# Patient Record
Sex: Female | Born: 1985 | Race: White | Hispanic: No | Marital: Married | Smoking: Never smoker
Health system: Southern US, Community
[De-identification: ages and names within clinical notes are randomized; demographics above are authoritative.]

## PROBLEM LIST (undated history)

## (undated) DIAGNOSIS — Z8759 Personal history of other complications of pregnancy, childbirth and the puerperium: Secondary | ICD-10-CM

## (undated) DIAGNOSIS — F32A Depression, unspecified: Secondary | ICD-10-CM

## (undated) DIAGNOSIS — D649 Anemia, unspecified: Secondary | ICD-10-CM

## (undated) DIAGNOSIS — O139 Gestational [pregnancy-induced] hypertension without significant proteinuria, unspecified trimester: Secondary | ICD-10-CM

## (undated) DIAGNOSIS — N393 Stress incontinence (female) (male): Secondary | ICD-10-CM

## (undated) DIAGNOSIS — E079 Disorder of thyroid, unspecified: Secondary | ICD-10-CM

## (undated) DIAGNOSIS — F329 Major depressive disorder, single episode, unspecified: Secondary | ICD-10-CM

## (undated) DIAGNOSIS — E063 Autoimmune thyroiditis: Secondary | ICD-10-CM

## (undated) DIAGNOSIS — Z862 Personal history of diseases of the blood and blood-forming organs and certain disorders involving the immune mechanism: Secondary | ICD-10-CM

## (undated) DIAGNOSIS — F419 Anxiety disorder, unspecified: Secondary | ICD-10-CM

## (undated) DIAGNOSIS — F3281 Premenstrual dysphoric disorder: Secondary | ICD-10-CM

## (undated) DIAGNOSIS — N920 Excessive and frequent menstruation with regular cycle: Secondary | ICD-10-CM

## (undated) HISTORY — DX: Disorder of thyroid, unspecified: E07.9

## (undated) HISTORY — DX: Premenstrual dysphoric disorder: F32.81

## (undated) HISTORY — DX: Gestational (pregnancy-induced) hypertension without significant proteinuria, unspecified trimester: O13.9

## (undated) HISTORY — DX: Depression, unspecified: F32.A

## (undated) HISTORY — PX: WISDOM TOOTH EXTRACTION: SHX21

---
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
## 2012-10-06 ENCOUNTER — Other Ambulatory Visit (HOSPITAL_COMMUNITY)
Admission: RE | Admit: 2012-10-06 | Discharge: 2012-10-06 | Disposition: A | Discharge status: Home / Self Care | Payer: BC Managed Care – PPO | Source: Ambulatory Visit | Attending: Family Medicine | Admitting: Family Medicine

## 2012-10-06 DIAGNOSIS — Z1151 Encounter for screening for human papillomavirus (HPV): Secondary | ICD-10-CM | POA: Insufficient documentation

## 2012-10-06 DIAGNOSIS — Z124 Encounter for screening for malignant neoplasm of cervix: Secondary | ICD-10-CM | POA: Insufficient documentation

## 2013-05-19 ENCOUNTER — Other Ambulatory Visit: Payer: Self-pay | Admitting: Family Medicine

## 2013-05-19 ENCOUNTER — Other Ambulatory Visit (HOSPITAL_COMMUNITY)
Admission: RE | Admit: 2013-05-19 | Discharge: 2013-05-19 | Disposition: A | Discharge status: Home / Self Care | Payer: BC Managed Care – PPO | Source: Ambulatory Visit | Attending: Family Medicine | Admitting: Family Medicine

## 2013-05-19 DIAGNOSIS — Z1151 Encounter for screening for human papillomavirus (HPV): Secondary | ICD-10-CM | POA: Insufficient documentation

## 2013-05-19 DIAGNOSIS — Z113 Encounter for screening for infections with a predominantly sexual mode of transmission: Secondary | ICD-10-CM | POA: Insufficient documentation

## 2013-05-19 DIAGNOSIS — Z124 Encounter for screening for malignant neoplasm of cervix: Secondary | ICD-10-CM | POA: Insufficient documentation

## 2015-05-11 DIAGNOSIS — F419 Anxiety disorder, unspecified: Secondary | ICD-10-CM | POA: Insufficient documentation

## 2015-05-11 DIAGNOSIS — Z6841 Body Mass Index (BMI) 40.0 and over, adult: Secondary | ICD-10-CM | POA: Insufficient documentation

## 2015-12-12 DIAGNOSIS — F5081 Binge eating disorder: Secondary | ICD-10-CM | POA: Insufficient documentation

## 2015-12-12 DIAGNOSIS — F50819 Binge eating disorder, unspecified: Secondary | ICD-10-CM | POA: Insufficient documentation

## 2016-01-16 LAB — OB RESULTS CONSOLE RUBELLA ANTIBODY, IGM: Rubella: IMMUNE

## 2016-01-16 LAB — OB RESULTS CONSOLE ABO/RH: RH Type: POSITIVE

## 2016-01-16 LAB — OB RESULTS CONSOLE HEPATITIS B SURFACE ANTIGEN: Hepatitis B Surface Ag: NEGATIVE

## 2016-01-16 LAB — OB RESULTS CONSOLE HIV ANTIBODY (ROUTINE TESTING): HIV: NONREACTIVE

## 2016-01-16 LAB — OB RESULTS CONSOLE RPR: RPR: NONREACTIVE

## 2016-01-16 LAB — OB RESULTS CONSOLE ANTIBODY SCREEN: Antibody Screen: NEGATIVE

## 2016-01-24 LAB — OB RESULTS CONSOLE GC/CHLAMYDIA
Chlamydia: NEGATIVE
Gonorrhea: NEGATIVE

## 2016-04-18 DIAGNOSIS — Z36 Encounter for antenatal screening of mother: Secondary | ICD-10-CM | POA: Diagnosis not present

## 2016-07-02 DIAGNOSIS — Z23 Encounter for immunization: Secondary | ICD-10-CM | POA: Diagnosis not present

## 2016-07-19 DIAGNOSIS — Z3685 Encounter for antenatal screening for Streptococcus B: Secondary | ICD-10-CM | POA: Diagnosis not present

## 2016-07-19 LAB — OB RESULTS CONSOLE GBS: GBS: NEGATIVE

## 2016-07-31 DIAGNOSIS — O3663X0 Maternal care for excessive fetal growth, third trimester, not applicable or unspecified: Secondary | ICD-10-CM | POA: Diagnosis not present

## 2016-07-31 DIAGNOSIS — Z3A37 37 weeks gestation of pregnancy: Secondary | ICD-10-CM | POA: Diagnosis not present

## 2016-08-16 ENCOUNTER — Inpatient Hospital Stay (HOSPITAL_COMMUNITY)
Admission: AD | Admit: 2016-08-16 | Discharge: 2016-08-16 | Disposition: A | Discharge status: Home / Self Care | Payer: Self-pay | Source: Ambulatory Visit | Attending: Obstetrics and Gynecology | Admitting: Obstetrics and Gynecology

## 2016-08-16 ENCOUNTER — Encounter (HOSPITAL_COMMUNITY): Payer: Self-pay | Admitting: *Deleted

## 2016-08-16 DIAGNOSIS — O471 False labor at or after 37 completed weeks of gestation: Secondary | ICD-10-CM | POA: Diagnosis not present

## 2016-08-16 DIAGNOSIS — Z3A39 39 weeks gestation of pregnancy: Secondary | ICD-10-CM | POA: Diagnosis not present

## 2016-08-16 HISTORY — DX: Anxiety disorder, unspecified: F41.9

## 2016-08-16 MED ORDER — OXYCODONE-ACETAMINOPHEN 5-325 MG PO TABS
2.0000 | ORAL_TABLET | Freq: Once | ORAL | Status: AC
  Administered 2016-08-16: 2 via ORAL
  Filled 2016-08-16: qty 2

## 2016-08-16 NOTE — Discharge Instructions (Signed)
Braxton Hicks Contractions °Contractions of the uterus can occur throughout pregnancy. Contractions are not always a sign that you are in labor.  °WHAT ARE BRAXTON HICKS CONTRACTIONS?  °Contractions that occur before labor are called Braxton Hicks contractions, or false labor. Toward the end of pregnancy (32-34 weeks), these contractions can develop more often and may become more forceful. This is not true labor because these contractions do not result in opening (dilatation) and thinning of the cervix. They are sometimes difficult to tell apart from true labor because these contractions can be forceful and people have different pain tolerances. You should not feel embarrassed if you go to the hospital with false labor. Sometimes, the only way to tell if you are in true labor is for your health care provider to look for changes in the cervix. °If there are no prenatal problems or other health problems associated with the pregnancy, it is completely safe to be sent home with false labor and await the onset of true labor. °HOW CAN YOU TELL THE DIFFERENCE BETWEEN TRUE AND FALSE LABOR? °False Labor °· The contractions of false labor are usually shorter and not as hard as those of true labor.   °· The contractions are usually irregular.   °· The contractions are often felt in the front of the lower abdomen and in the groin.   °· The contractions may go away when you walk around or change positions while lying down.   °· The contractions get weaker and are shorter lasting as time goes on.   °· The contractions do not usually become progressively stronger, regular, and closer together as with true labor.   °True Labor °· Contractions in true labor last 30-70 seconds, become very regular, usually become more intense, and increase in frequency.   °· The contractions do not go away with walking.   °· The discomfort is usually felt in the top of the uterus and spreads to the lower abdomen and low back.   °· True labor can be  determined by your health care provider with an exam. This will show that the cervix is dilating and getting thinner.   °WHAT TO REMEMBER °· Keep up with your usual exercises and follow other instructions given by your health care provider.   °· Take medicines as directed by your health care provider.   °· Keep your regular prenatal appointments.   °· Eat and drink lightly if you think you are going into labor.   °· If Braxton Hicks contractions are making you uncomfortable:   °¨ Change your position from lying down or resting to walking, or from walking to resting.   °¨ Sit and rest in a tub of warm water.   °¨ Drink 2-3 glasses of water. Dehydration may cause these contractions.   °¨ Do slow and deep breathing several times an hour.   °WHEN SHOULD I SEEK IMMEDIATE MEDICAL CARE? °Seek immediate medical care if: °· Your contractions become stronger, more regular, and closer together.   °· You have fluid leaking or gushing from your vagina.   °· You have a fever.   °· You pass blood-tinged mucus.   °· You have vaginal bleeding.   °· You have continuous abdominal pain.   °· You have low back pain that you never had before.   °· You feel your baby's head pushing down and causing pelvic pressure.   °· Your baby is not moving as much as it used to.   °  °This information is not intended to replace advice given to you by your health care provider. Make sure you discuss any questions you have with your health care   provider. °  °Document Released: 09/23/2005 Document Revised: 09/28/2013 Document Reviewed: 07/05/2013 °Elsevier Interactive Patient Education ©2016 [employer] ° °

## 2016-08-16 NOTE — MAU Provider Note (Signed)
RN called about this patient coming in for labor evaluation bu during cervical exam noted 3 minute fetal deceleration. FHT evaluated. Baseline 140s/ moderate variability/ accels noted/ one episode of decel 3 min noted with return to baseline and no loss of variability. So patient was advised to ambulate 1 hr and reassess cervix as well as FHT. I saw pt and discussed findings.  V.Bethani Brugger, MD

## 2016-08-16 NOTE — MAU Note (Signed)
PT  SAYS  SHE STARTED   HURTING  WITH  UC  SINCE 0100.   VE YESTERDAY- CLOSED.      DENIES  HSV AND MRSA.   GBS- NEG

## 2016-08-17 ENCOUNTER — Inpatient Hospital Stay (HOSPITAL_COMMUNITY): Payer: BLUE CROSS/BLUE SHIELD | Admitting: Anesthesiology

## 2016-08-17 ENCOUNTER — Encounter (HOSPITAL_COMMUNITY): Payer: Self-pay

## 2016-08-17 ENCOUNTER — Inpatient Hospital Stay (HOSPITAL_COMMUNITY)
Admission: AD | Admit: 2016-08-17 | Discharge: 2016-08-21 | DRG: 765 | Disposition: A | Discharge status: Home / Self Care | Payer: BLUE CROSS/BLUE SHIELD | Source: Ambulatory Visit | Attending: Obstetrics & Gynecology | Admitting: Obstetrics & Gynecology

## 2016-08-17 DIAGNOSIS — D62 Acute posthemorrhagic anemia: Secondary | ICD-10-CM | POA: Diagnosis not present

## 2016-08-17 DIAGNOSIS — Z3A39 39 weeks gestation of pregnancy: Secondary | ICD-10-CM | POA: Diagnosis not present

## 2016-08-17 DIAGNOSIS — O99214 Obesity complicating childbirth: Secondary | ICD-10-CM | POA: Diagnosis present

## 2016-08-17 DIAGNOSIS — Z6841 Body Mass Index (BMI) 40.0 and over, adult: Secondary | ICD-10-CM | POA: Diagnosis not present

## 2016-08-17 DIAGNOSIS — O134 Gestational [pregnancy-induced] hypertension without significant proteinuria, complicating childbirth: Secondary | ICD-10-CM | POA: Diagnosis not present

## 2016-08-17 DIAGNOSIS — Z3493 Encounter for supervision of normal pregnancy, unspecified, third trimester: Secondary | ICD-10-CM | POA: Diagnosis not present

## 2016-08-17 DIAGNOSIS — O9081 Anemia of the puerperium: Secondary | ICD-10-CM | POA: Diagnosis not present

## 2016-08-17 DIAGNOSIS — Z9889 Other specified postprocedural states: Secondary | ICD-10-CM

## 2016-08-17 LAB — COMPREHENSIVE METABOLIC PANEL
ALT: 17 U/L (ref 14–54)
AST: 21 U/L (ref 15–41)
Albumin: 3.2 g/dL — ABNORMAL LOW (ref 3.5–5.0)
Alkaline Phosphatase: 133 U/L — ABNORMAL HIGH (ref 38–126)
Anion gap: 11 (ref 5–15)
BUN: 9 mg/dL (ref 6–20)
CO2: 22 mmol/L (ref 22–32)
Calcium: 9.5 mg/dL (ref 8.9–10.3)
Chloride: 103 mmol/L (ref 101–111)
Creatinine, Ser: 0.49 mg/dL (ref 0.44–1.00)
GFR calc Af Amer: >60 mL/min (ref >60)
GFR calc non Af Amer: >60 mL/min (ref >60)
Glucose, Bld: 110 mg/dL — ABNORMAL HIGH (ref 65–99)
Potassium: 4 mmol/L (ref 3.5–5.1)
Sodium: 136 mmol/L (ref 135–145)
Total Bilirubin: 0.5 mg/dL (ref 0.3–1.2)
Total Protein: 6.7 g/dL (ref 6.5–8.1)

## 2016-08-17 LAB — CBC
HCT: 35 % — ABNORMAL LOW (ref 36.0–46.0)
Hemoglobin: 12.1 g/dL (ref 12.0–15.0)
MCH: 28.8 pg (ref 26.0–34.0)
MCHC: 34.6 g/dL (ref 30.0–36.0)
MCV: 83.3 fL (ref 78.0–100.0)
Platelets: 237 10*3/uL (ref 150–400)
RBC: 4.2 MIL/uL (ref 3.87–5.11)
RDW: 14.1 % (ref 11.5–15.5)
WBC: 18.8 10*3/uL — ABNORMAL HIGH (ref 4.0–10.5)

## 2016-08-17 LAB — TYPE AND SCREEN
ABO/RH(D): O POS
Antibody Screen: NEGATIVE

## 2016-08-17 LAB — PROTEIN / CREATININE RATIO, URINE
Creatinine, Urine: 226 mg/dL
Protein Creatinine Ratio: 0.09 mg/mg{Cre} (ref 0.00–0.15)
Total Protein, Urine: 20 mg/dL

## 2016-08-17 LAB — URIC ACID: Uric Acid, Serum: 4.7 mg/dL (ref 2.3–6.6)

## 2016-08-17 MED ORDER — OXYTOCIN 40 UNITS IN LACTATED RINGERS INFUSION - SIMPLE MED
1.0000 m[IU]/min | INTRAVENOUS | Status: DC
  Administered 2016-08-17: 2 m[IU]/min via INTRAVENOUS

## 2016-08-17 MED ORDER — OXYCODONE-ACETAMINOPHEN 5-325 MG PO TABS: 1.0000 | ORAL_TABLET | ORAL | Status: DC | PRN

## 2016-08-17 MED ORDER — FENTANYL 2.5 MCG/ML BUPIVACAINE 1/10 % EPIDURAL INFUSION (WH - ANES)
INTRAMUSCULAR | Status: AC
  Filled 2016-08-17: qty 100

## 2016-08-17 MED ORDER — OXYTOCIN BOLUS FROM INFUSION: 500.0000 mL | Freq: Once | INTRAVENOUS | Status: DC

## 2016-08-17 MED ORDER — EPHEDRINE 5 MG/ML INJ: 10.0000 mg | INTRAVENOUS | Status: DC | PRN

## 2016-08-17 MED ORDER — PHENYLEPHRINE 40 MCG/ML (10ML) SYRINGE FOR IV PUSH (FOR BLOOD PRESSURE SUPPORT): 80.0000 ug | PREFILLED_SYRINGE | INTRAVENOUS | Status: DC | PRN

## 2016-08-17 MED ORDER — FENTANYL 2.5 MCG/ML BUPIVACAINE 1/10 % EPIDURAL INFUSION (WH - ANES)
14.0000 mL/h | INTRAMUSCULAR | Status: DC | PRN
  Administered 2016-08-17 – 2016-08-18 (×2): 14 mL/h via EPIDURAL
  Filled 2016-08-17: qty 100

## 2016-08-17 MED ORDER — ONDANSETRON HCL 4 MG/2ML IJ SOLN: 4.0000 mg | Freq: Four times a day (QID) | INTRAMUSCULAR | Status: DC | PRN

## 2016-08-17 MED ORDER — FLEET ENEMA 7-19 GM/118ML RE ENEM: 1.0000 | ENEMA | RECTAL | Status: DC | PRN

## 2016-08-17 MED ORDER — OXYTOCIN 40 UNITS IN LACTATED RINGERS INFUSION - SIMPLE MED
2.5000 [IU]/h | INTRAVENOUS | Status: DC
  Filled 2016-08-17: qty 1000

## 2016-08-17 MED ORDER — LACTATED RINGERS IV SOLN
INTRAVENOUS | Status: DC
  Administered 2016-08-17: 23:00:00 via INTRAVENOUS

## 2016-08-17 MED ORDER — TERBUTALINE SULFATE 1 MG/ML IJ SOLN: 0.2500 mg | Freq: Once | INTRAMUSCULAR | Status: DC | PRN

## 2016-08-17 MED ORDER — PHENYLEPHRINE 40 MCG/ML (10ML) SYRINGE FOR IV PUSH (FOR BLOOD PRESSURE SUPPORT)
PREFILLED_SYRINGE | INTRAVENOUS | Status: AC
  Filled 2016-08-17: qty 20

## 2016-08-17 MED ORDER — LACTATED RINGERS IV SOLN: 500.0000 mL | INTRAVENOUS | Status: DC | PRN

## 2016-08-17 MED ORDER — LIDOCAINE HCL (PF) 1 % IJ SOLN: 30.0000 mL | INTRAMUSCULAR | Status: DC | PRN

## 2016-08-17 MED ORDER — OXYCODONE-ACETAMINOPHEN 5-325 MG PO TABS: 2.0000 | ORAL_TABLET | ORAL | Status: DC | PRN

## 2016-08-17 MED ORDER — DIPHENHYDRAMINE HCL 50 MG/ML IJ SOLN: 12.5000 mg | INTRAMUSCULAR | Status: DC | PRN

## 2016-08-17 MED ORDER — LIDOCAINE HCL (PF) 1 % IJ SOLN
INTRAMUSCULAR | Status: DC | PRN
  Administered 2016-08-17 (×2): 7 mL via EPIDURAL

## 2016-08-17 MED ORDER — SOD CITRATE-CITRIC ACID 500-334 MG/5ML PO SOLN
30.0000 mL | ORAL | Status: DC | PRN
  Administered 2016-08-18: 30 mL via ORAL
  Filled 2016-08-17: qty 15

## 2016-08-17 MED ORDER — LACTATED RINGERS IV SOLN
500.0000 mL | Freq: Once | INTRAVENOUS | Status: AC
  Administered 2016-08-17: 500 mL via INTRAVENOUS

## 2016-08-17 MED ORDER — ACETAMINOPHEN 325 MG PO TABS: 650.0000 mg | ORAL_TABLET | ORAL | Status: DC | PRN

## 2016-08-17 NOTE — MAU Note (Signed)
Pt presents complaining of contractions that are worse. Possibly leaking fluid. Reports good fetal movement.

## 2016-08-17 NOTE — H&P (Signed)
Ruta HindsMary A Spielberg is a 30 y.o. female G1P0 499w3d presenting for spontaneous labor.  HPI/HPP:  Normal pregnancy, presenting for reg UCs.  Possible AF leak.  No vaginal bleeding.  No PIH Sx.  OB History    Gravida Para Term Preterm AB Living   1             SAB TAB Ectopic Multiple Live Births                 Past Medical History:  Diagnosis Date  . Anxiety    History reviewed. No pertinent surgical history. Family History: family history is not on file. Social History:  reports that she has never smoked. She has never used smokeless tobacco. She reports that she does not drink alcohol or use drugs.  No Known Allergies  Dilation: 8 Effacement (%): 80 Station: 0 Exam by:: L.Mears,Rn   Blood pressure (!) 154/69, pulse 91, temperature 98.7 F (37.1 C), temperature source Oral, resp. rate (!) 22, height 5\' 5"  (1.651 m), weight 262 lb (118.8 kg), SpO2 99 %. Exam Physical Exam   FHR 150's per min with good variability, accelerations present.  No deceleration currently. UCs not monitoring well, per nursing q3-4 min.  HPP:  Patient Active Problem List   Diagnosis Date Noted  . Normal labor 08/17/2016    Prenatal labs: ABO, Rh: --/--/O POS (11/11 1945) Antibody: NEG (11/11 1945) Rubella: Immune RPR: Nonreactive (04/11 0000)  HBsAg: Negative (04/11 0000)  HIV: Non-reactive (04/11 0000)  Genetic testing: Ultrascreen neg.  AFP1 neg. US anato: wnl, resolved previa/low lying.  Normal insertion in 3rd trimester. 1 hr GTT: 135 GBS: Negative (10/13 0000)   Assessment/Plan: 39 wks Spontaneous labor progressing well.  FHR Cat 1.  Pain controled by epidural.  PIH, labs pending.  Expectant management.  Dr Billy Coastaavon primary aware of patient's progress, will take over care.  Marie-Lyne Ahmiyah Coil 08/17/2016, 9:29 PM

## 2016-08-17 NOTE — Anesthesia Preprocedure Evaluation (Addendum)
Anesthesia Evaluation  Patient identified by MRN, date of birth, ID band Patient awake    Reviewed: Allergy & Precautions, H&P , NPO status , Patient's Chart, lab work & pertinent test results  Airway Mallampati: II  TM Distance: >3 FB Neck ROM: full    Dental no notable dental hx.    Pulmonary neg pulmonary ROS,    Pulmonary exam normal        Cardiovascular negative cardio ROS Normal cardiovascular exam     Neuro/Psych negative neurological ROS     GI/Hepatic negative GI ROS, Neg liver ROS,   Endo/Other  Morbid obesity  Renal/GU negative Renal ROS     Musculoskeletal   Abdominal (+) + obese,   Peds  Hematology negative hematology ROS (+)   Anesthesia Other Findings   Reproductive/Obstetrics (+) Pregnancy                             Anesthesia Physical Anesthesia Plan  ASA: III  Anesthesia Plan: Epidural   Post-op Pain Management:    Induction:   Airway Management Planned:   Additional Equipment:   Intra-op Plan:   Post-operative Plan:   Informed Consent: I have reviewed the patients History and Physical, chart, labs and discussed the procedure including the risks, benefits and alternatives for the proposed anesthesia with the patient or authorized representative who has indicated his/her understanding and acceptance.     Plan Discussed with: CRNA and Surgeon  Anesthesia Plan Comments: (For Csection with labor epidural.)       Anesthesia Quick Evaluation

## 2016-08-17 NOTE — Anesthesia Pain Management Evaluation Note (Signed)
  CRNA Pain Management Visit Note  Patient: Ruta HindsMary A Loyal, 30 y.o., female  "Hello I am a member of the anesthesia team at [facility]. We have an anesthesia team available at all times to provide care throughout the hospital, including epidural management and anesthesia for C-section. I don't know your plan for the delivery whether it a natural birth, water birth, IV sedation, nitrous supplementation, doula or epidural, but we want to meet your pain goals."   1.Was your pain managed to your expectations on prior hospitalizations?   No prior hospitalizations  2.What is your expectation for pain management during this hospitalization?     Epidural  3.How can we help you reach that goal? Epidural in place   Record the patient's initial score and the patient's pain goal.   Pain: 2  Pain Goal: 2 [facility] wants you to be able to say your pain was always managed very well.  Freddie Nghiem 08/17/2016

## 2016-08-17 NOTE — Anesthesia Procedure Notes (Addendum)
Epidural Patient location during procedure: OB Start time: 08/17/2016 8:22 PM End time: 08/17/2016 8:25 PM  Staffing Anesthesiologist: Leilani AbleHATCHETT, Carren Blakley Performed: anesthesiologist   Preanesthetic Checklist Completed: patient identified, surgical consent, pre-op evaluation, timeout performed, IV checked, risks and benefits discussed and monitors and equipment checked  Epidural Patient position: sitting Prep: site prepped and draped and DuraPrep Patient monitoring: continuous pulse ox and blood pressure Approach: midline Location: L3-L4 Injection technique: LOR air  Needle:  Needle type: Tuohy  Needle gauge: 17 G Needle length: 9 cm and 9 Needle insertion depth: 7 cm Catheter type: closed end flexible Catheter size: 19 Gauge Catheter at skin depth: 12 cm Test dose: negative and Other  Assessment Sensory level: T9 Events: blood not aspirated, injection not painful, no injection resistance, negative IV test and no paresthesia  Additional Notes Reason for block:procedure for pain

## 2016-08-17 NOTE — Progress Notes (Addendum)
Subjective: Doing well, pain controled, UCs q4-6 min lasting 2 min  Anesthesia epidural   Objective: BP (!) 156/71   Pulse 94   Temp 98.7 F (37.1 C) (Oral)   Resp (!) 22   Ht 5\' 5"  (1.651 m)   Wt 262 lb (118.8 kg)   SpO2 99%   BMI 43.60 kg/m    FHT:  130-140's with good variability, Accelerations present.  Mild occasional early decelerations with long UCs. UC:   regular, every 4-6 minutes, lasting 2 min VE:  9 cm/100%/Vtx/0 station.  Tinted meco.         Probably Occiput Rt Ant, but molding and caput, difficult to feel fontanelles.  PIH labs wnl with AST 21, ALT 17, Plt 237, Prtn/Creat 0.09.   Assessment / Plan: Spontaneous labor, progressing normally.  PIH, no evidence of PEC.  Expectant management.  Fetal Wellbeing:  Category I Pain Control:  Epidural  Anticipated MOD:  NSVD  Hannah Acosta 08/17/2016, 10:22 PM

## 2016-08-17 NOTE — Progress Notes (Signed)
Dr Seymour Barslavoie Notified of patients chief complaint, fht, cervical exam, admit to BS. May have epidural upon request

## 2016-08-18 ENCOUNTER — Encounter (HOSPITAL_COMMUNITY)
Admission: AD | Disposition: A | Discharge status: Home / Self Care | Payer: Self-pay | Source: Ambulatory Visit | Attending: Obstetrics & Gynecology

## 2016-08-18 ENCOUNTER — Encounter (HOSPITAL_COMMUNITY): Payer: Self-pay | Admitting: *Deleted

## 2016-08-18 DIAGNOSIS — Z9889 Other specified postprocedural states: Secondary | ICD-10-CM

## 2016-08-18 LAB — ABO/RH: ABO/RH(D): O POS

## 2016-08-18 LAB — RPR: RPR Ser Ql: NONREACTIVE

## 2016-08-18 SURGERY — Surgical Case
Anesthesia: Epidural | Wound class: Clean Contaminated

## 2016-08-18 MED ORDER — ZOLPIDEM TARTRATE 5 MG PO TABS: 5.0000 mg | ORAL_TABLET | Freq: Every evening | ORAL | Status: DC | PRN

## 2016-08-18 MED ORDER — KETOROLAC TROMETHAMINE 30 MG/ML IJ SOLN: 30.0000 mg | Freq: Once | INTRAMUSCULAR | Status: DC

## 2016-08-18 MED ORDER — NALOXONE HCL 2 MG/2ML IJ SOSY
1.0000 ug/kg/h | PREFILLED_SYRINGE | INTRAMUSCULAR | Status: DC | PRN
  Filled 2016-08-18: qty 2

## 2016-08-18 MED ORDER — MEPERIDINE HCL 25 MG/ML IJ SOLN
INTRAMUSCULAR | Status: DC | PRN
  Administered 2016-08-18: 6.25 mg via INTRAVENOUS
  Administered 2016-08-18: 12.5 mg via INTRAVENOUS
  Administered 2016-08-18: 6.25 mg via INTRAVENOUS

## 2016-08-18 MED ORDER — LACTATED RINGERS IV SOLN
INTRAVENOUS | Status: DC | PRN
  Administered 2016-08-18 (×3): via INTRAVENOUS

## 2016-08-18 MED ORDER — TETANUS-DIPHTH-ACELL PERTUSSIS 5-2.5-18.5 LF-MCG/0.5 IM SUSP: 0.5000 mL | Freq: Once | INTRAMUSCULAR | Status: DC

## 2016-08-18 MED ORDER — LACTATED RINGERS IV SOLN
INTRAVENOUS | Status: DC
  Administered 2016-08-18: 15:00:00 via INTRAVENOUS

## 2016-08-18 MED ORDER — SIMETHICONE 80 MG PO CHEW
80.0000 mg | CHEWABLE_TABLET | Freq: Three times a day (TID) | ORAL | Status: DC
  Administered 2016-08-18 – 2016-08-21 (×8): 80 mg via ORAL
  Filled 2016-08-18 (×9): qty 1

## 2016-08-18 MED ORDER — NALBUPHINE HCL 10 MG/ML IJ SOLN: 5.0000 mg | Freq: Once | INTRAMUSCULAR | Status: DC | PRN

## 2016-08-18 MED ORDER — HYDROMORPHONE HCL 1 MG/ML IJ SOLN: 0.2500 mg | INTRAMUSCULAR | Status: DC | PRN

## 2016-08-18 MED ORDER — OXYTOCIN 10 UNIT/ML IJ SOLN
INTRAMUSCULAR | Status: AC
  Filled 2016-08-18: qty 4

## 2016-08-18 MED ORDER — DIBUCAINE 1 % RE OINT: 1.0000 "application " | TOPICAL_OINTMENT | RECTAL | Status: DC | PRN

## 2016-08-18 MED ORDER — BUPIVACAINE HCL (PF) 0.25 % IJ SOLN
INTRAMUSCULAR | Status: AC
  Filled 2016-08-18: qty 30

## 2016-08-18 MED ORDER — SCOPOLAMINE 1 MG/3DAYS TD PT72: 1.0000 | MEDICATED_PATCH | Freq: Once | TRANSDERMAL | Status: DC

## 2016-08-18 MED ORDER — ACETAMINOPHEN 325 MG PO TABS
650.0000 mg | ORAL_TABLET | ORAL | Status: DC | PRN
  Administered 2016-08-20 – 2016-08-21 (×6): 650 mg via ORAL
  Filled 2016-08-18 (×6): qty 2

## 2016-08-18 MED ORDER — MEPERIDINE HCL 25 MG/ML IJ SOLN: 6.2500 mg | INTRAMUSCULAR | Status: DC | PRN

## 2016-08-18 MED ORDER — SCOPOLAMINE 1 MG/3DAYS TD PT72
MEDICATED_PATCH | TRANSDERMAL | Status: DC | PRN
  Administered 2016-08-18: 1 via TRANSDERMAL

## 2016-08-18 MED ORDER — KETOROLAC TROMETHAMINE 30 MG/ML IJ SOLN
INTRAMUSCULAR | Status: AC
  Administered 2016-08-18: 30 mg via INTRAMUSCULAR
  Filled 2016-08-18: qty 1

## 2016-08-18 MED ORDER — COCONUT OIL OIL
1.0000 "application " | TOPICAL_OIL | Status: DC | PRN
  Filled 2016-08-18: qty 120

## 2016-08-18 MED ORDER — SODIUM BICARBONATE 8.4 % IV SOLN
INTRAVENOUS | Status: AC
  Filled 2016-08-18: qty 50

## 2016-08-18 MED ORDER — ONDANSETRON HCL 4 MG/2ML IJ SOLN
INTRAMUSCULAR | Status: DC | PRN
  Administered 2016-08-18: 4 mg via INTRAVENOUS

## 2016-08-18 MED ORDER — ONDANSETRON HCL 4 MG/2ML IJ SOLN: 4.0000 mg | Freq: Three times a day (TID) | INTRAMUSCULAR | Status: DC | PRN

## 2016-08-18 MED ORDER — MORPHINE SULFATE (PF) 0.5 MG/ML IJ SOLN
INTRAMUSCULAR | Status: AC
  Filled 2016-08-18: qty 10

## 2016-08-18 MED ORDER — FERROUS SULFATE 325 (65 FE) MG PO TABS
325.0000 mg | ORAL_TABLET | Freq: Two times a day (BID) | ORAL | Status: DC
  Administered 2016-08-18: 325 mg via ORAL
  Filled 2016-08-18 (×2): qty 1

## 2016-08-18 MED ORDER — ONDANSETRON HCL 4 MG/2ML IJ SOLN
INTRAMUSCULAR | Status: AC
  Filled 2016-08-18: qty 2

## 2016-08-18 MED ORDER — MEPERIDINE HCL 25 MG/ML IJ SOLN
INTRAMUSCULAR | Status: AC
  Filled 2016-08-18: qty 1

## 2016-08-18 MED ORDER — DIPHENHYDRAMINE HCL 50 MG/ML IJ SOLN: 12.5000 mg | INTRAMUSCULAR | Status: DC | PRN

## 2016-08-18 MED ORDER — CEFAZOLIN SODIUM-DEXTROSE 2-4 GM/100ML-% IV SOLN
INTRAVENOUS | Status: AC
  Filled 2016-08-18: qty 100

## 2016-08-18 MED ORDER — MORPHINE SULFATE (PF) 0.5 MG/ML IJ SOLN
INTRAMUSCULAR | Status: DC | PRN
  Administered 2016-08-18: 4 mg via EPIDURAL

## 2016-08-18 MED ORDER — SIMETHICONE 80 MG PO CHEW
80.0000 mg | CHEWABLE_TABLET | ORAL | Status: DC | PRN
  Administered 2016-08-18: 80 mg via ORAL

## 2016-08-18 MED ORDER — SODIUM BICARBONATE 8.4 % IV SOLN
INTRAVENOUS | Status: DC | PRN
  Administered 2016-08-18: 5 mL via EPIDURAL
  Administered 2016-08-18: 10 mL via EPIDURAL
  Administered 2016-08-18: 5 mL via EPIDURAL

## 2016-08-18 MED ORDER — WITCH HAZEL-GLYCERIN EX PADS: 1.0000 "application " | MEDICATED_PAD | CUTANEOUS | Status: DC | PRN

## 2016-08-18 MED ORDER — OXYTOCIN 10 UNIT/ML IJ SOLN
INTRAVENOUS | Status: DC | PRN
  Administered 2016-08-18: 40 [IU] via INTRAVENOUS

## 2016-08-18 MED ORDER — LACTATED RINGERS IV SOLN
INTRAVENOUS | Status: DC | PRN
  Administered 2016-08-18: 05:00:00 via INTRAVENOUS

## 2016-08-18 MED ORDER — SENNOSIDES-DOCUSATE SODIUM 8.6-50 MG PO TABS
2.0000 | ORAL_TABLET | ORAL | Status: DC
Start: 2016-08-19 — End: 2016-08-21
  Administered 2016-08-18 – 2016-08-20 (×3): 2 via ORAL
  Filled 2016-08-18 (×3): qty 2

## 2016-08-18 MED ORDER — IBUPROFEN 600 MG PO TABS: 600.0000 mg | ORAL_TABLET | Freq: Four times a day (QID) | ORAL | Status: DC | PRN

## 2016-08-18 MED ORDER — ACETAMINOPHEN 500 MG PO TABS
1000.0000 mg | ORAL_TABLET | Freq: Four times a day (QID) | ORAL | Status: AC
  Administered 2016-08-18 (×3): 1000 mg via ORAL
  Filled 2016-08-18 (×3): qty 2

## 2016-08-18 MED ORDER — CEFAZOLIN SODIUM-DEXTROSE 2-4 GM/100ML-% IV SOLN
2.0000 g | Freq: Once | INTRAVENOUS | Status: AC
  Administered 2016-08-18: 2 g via INTRAVENOUS

## 2016-08-18 MED ORDER — NALBUPHINE HCL 10 MG/ML IJ SOLN: 5.0000 mg | INTRAMUSCULAR | Status: DC | PRN

## 2016-08-18 MED ORDER — DIPHENHYDRAMINE HCL 25 MG PO CAPS: 25.0000 mg | ORAL_CAPSULE | Freq: Four times a day (QID) | ORAL | Status: DC | PRN

## 2016-08-18 MED ORDER — PROMETHAZINE HCL 25 MG/ML IJ SOLN: 6.2500 mg | INTRAMUSCULAR | Status: DC | PRN

## 2016-08-18 MED ORDER — KETOROLAC TROMETHAMINE 30 MG/ML IJ SOLN
30.0000 mg | Freq: Four times a day (QID) | INTRAMUSCULAR | Status: DC | PRN
  Administered 2016-08-18: 30 mg via INTRAMUSCULAR

## 2016-08-18 MED ORDER — OXYCODONE HCL 5 MG PO TABS: 10.0000 mg | ORAL_TABLET | ORAL | Status: DC | PRN

## 2016-08-18 MED ORDER — KETOROLAC TROMETHAMINE 30 MG/ML IJ SOLN: 30.0000 mg | Freq: Four times a day (QID) | INTRAMUSCULAR | Status: DC | PRN

## 2016-08-18 MED ORDER — SERTRALINE HCL 50 MG PO TABS
50.0000 mg | ORAL_TABLET | Freq: Every day | ORAL | Status: DC
  Administered 2016-08-18 – 2016-08-21 (×4): 50 mg via ORAL
  Filled 2016-08-18 (×5): qty 1

## 2016-08-18 MED ORDER — LIDOCAINE-EPINEPHRINE (PF) 2 %-1:200000 IJ SOLN
INTRAMUSCULAR | Status: AC
  Filled 2016-08-18: qty 20

## 2016-08-18 MED ORDER — DIPHENHYDRAMINE HCL 25 MG PO CAPS
25.0000 mg | ORAL_CAPSULE | ORAL | Status: DC | PRN
  Administered 2016-08-18: 25 mg via ORAL
  Filled 2016-08-18 (×2): qty 1

## 2016-08-18 MED ORDER — IBUPROFEN 600 MG PO TABS
600.0000 mg | ORAL_TABLET | Freq: Four times a day (QID) | ORAL | Status: DC
  Administered 2016-08-18 – 2016-08-21 (×13): 600 mg via ORAL
  Filled 2016-08-18 (×13): qty 1

## 2016-08-18 MED ORDER — OXYTOCIN 40 UNITS IN LACTATED RINGERS INFUSION - SIMPLE MED: 2.5000 [IU]/h | INTRAVENOUS | Status: AC

## 2016-08-18 MED ORDER — SCOPOLAMINE 1 MG/3DAYS TD PT72
MEDICATED_PATCH | TRANSDERMAL | Status: AC
  Filled 2016-08-18: qty 1

## 2016-08-18 MED ORDER — NALOXONE HCL 0.4 MG/ML IJ SOLN: 0.4000 mg | INTRAMUSCULAR | Status: DC | PRN

## 2016-08-18 MED ORDER — NALBUPHINE HCL 10 MG/ML IJ SOLN
5.0000 mg | INTRAMUSCULAR | Status: DC | PRN
  Administered 2016-08-18: 5 mg via INTRAVENOUS
  Filled 2016-08-18: qty 1

## 2016-08-18 MED ORDER — BUPIVACAINE HCL 0.25 % IJ SOLN
INTRAMUSCULAR | Status: DC | PRN
  Administered 2016-08-18: 20 mL

## 2016-08-18 MED ORDER — OXYCODONE HCL 5 MG PO TABS
5.0000 mg | ORAL_TABLET | ORAL | Status: DC | PRN
  Administered 2016-08-20 – 2016-08-21 (×7): 5 mg via ORAL
  Filled 2016-08-18 (×7): qty 1

## 2016-08-18 MED ORDER — SODIUM CHLORIDE 0.9% FLUSH: 3.0000 mL | INTRAVENOUS | Status: DC | PRN

## 2016-08-18 MED ORDER — PRENATAL MULTIVITAMIN CH
1.0000 | ORAL_TABLET | Freq: Every day | ORAL | Status: DC
  Administered 2016-08-18 – 2016-08-21 (×4): 1 via ORAL
  Filled 2016-08-18 (×4): qty 1

## 2016-08-18 MED ORDER — SIMETHICONE 80 MG PO CHEW
80.0000 mg | CHEWABLE_TABLET | ORAL | Status: DC
  Administered 2016-08-18 – 2016-08-20 (×3): 80 mg via ORAL
  Filled 2016-08-18 (×3): qty 1

## 2016-08-18 MED ORDER — MAGNESIUM HYDROXIDE 400 MG/5ML PO SUSP: 30.0000 mL | ORAL | Status: DC | PRN

## 2016-08-18 MED ORDER — MENTHOL 3 MG MT LOZG: 1.0000 | LOZENGE | OROMUCOSAL | Status: DC | PRN

## 2016-08-18 SURGICAL SUPPLY — 38 items
CHLORAPREP W/TINT 26ML (MISCELLANEOUS) ×2 IMPLANT
CLAMP CORD UMBIL (MISCELLANEOUS) IMPLANT
CLOTH BEACON ORANGE TIMEOUT ST (SAFETY) ×2 IMPLANT
CONTAINER PREFILL 10% NBF 15ML (MISCELLANEOUS) IMPLANT
DERMABOND ADHESIVE PROPEN (GAUZE/BANDAGES/DRESSINGS) ×1
DERMABOND ADVANCED .7 DNX6 (GAUZE/BANDAGES/DRESSINGS) ×1 IMPLANT
DRSG OPSITE POSTOP 4X10 (GAUZE/BANDAGES/DRESSINGS) ×2 IMPLANT
ELECT REM PT RETURN 9FT ADLT (ELECTROSURGICAL) ×2
ELECTRODE REM PT RTRN 9FT ADLT (ELECTROSURGICAL) ×1 IMPLANT
EXTRACTOR VACUUM M CUP 4 TUBE (SUCTIONS) IMPLANT
GLOVE BIO SURGEON STRL SZ 6.5 (GLOVE) ×2 IMPLANT
GLOVE BIOGEL PI IND STRL 7.0 (GLOVE) ×2 IMPLANT
GLOVE BIOGEL PI INDICATOR 7.0 (GLOVE) ×2
GOWN STRL REUS W/TWL LRG LVL3 (GOWN DISPOSABLE) ×4 IMPLANT
KIT ABG SYR 3ML LUER SLIP (SYRINGE) IMPLANT
LIQUID BAND (GAUZE/BANDAGES/DRESSINGS) IMPLANT
NEEDLE HYPO 22GX1.5 SAFETY (NEEDLE) ×2 IMPLANT
NEEDLE HYPO 25X5/8 SAFETYGLIDE (NEEDLE) IMPLANT
PACK C SECTION WH (CUSTOM PROCEDURE TRAY) ×2 IMPLANT
PAD OB MATERNITY 4.3X12.25 (PERSONAL CARE ITEMS) ×2 IMPLANT
RTRCTR C-SECT PINK 25CM LRG (MISCELLANEOUS) ×2 IMPLANT
SUT MNCRL AB 3-0 PS2 27 (SUTURE) ×2 IMPLANT
SUT MON AB 4-0 PS1 27 (SUTURE) IMPLANT
SUT PLAIN 0 NONE (SUTURE) IMPLANT
SUT PLAIN 2 0 (SUTURE) ×1
SUT PLAIN ABS 2-0 CT1 27XMFL (SUTURE) ×1 IMPLANT
SUT VIC AB 0 CT1 27 (SUTURE) ×2
SUT VIC AB 0 CT1 27XBRD ANBCTR (SUTURE) ×2 IMPLANT
SUT VIC AB 0 CTX 36 (SUTURE) ×3
SUT VIC AB 0 CTX36XBRD ANBCTRL (SUTURE) ×3 IMPLANT
SUT VIC AB 2-0 CT1 27 (SUTURE) ×1
SUT VIC AB 2-0 CT1 TAPERPNT 27 (SUTURE) ×1 IMPLANT
SUT VIC AB 3-0 SH 27 (SUTURE)
SUT VIC AB 3-0 SH 27X BRD (SUTURE) IMPLANT
SUT VIC AB 4-0 PS2 27 (SUTURE) IMPLANT
SYR CONTROL 10ML LL (SYRINGE) ×2 IMPLANT
TOWEL OR 17X24 6PK STRL BLUE (TOWEL DISPOSABLE) ×2 IMPLANT
TRAY FOLEY CATH SILVER 14FR (SET/KITS/TRAYS/PACK) IMPLANT

## 2016-08-18 NOTE — Transfer of Care (Signed)
Immediate Anesthesia Transfer of Care Note  Patient: Hannah Acosta  Procedure(s) Performed: Procedure(s): CESAREAN SECTION (N/A)  Patient Location: PACU  Anesthesia Type:Epidural  Level of Consciousness: awake  Airway & Oxygen Therapy: Patient Spontanous Breathing  Post-op Assessment: Report given to RN and Post -op Vital signs reviewed and stable  Post vital signs: stable  Last Vitals:  Vitals:   08/18/16 0301 08/18/16 0331  BP: (!) 152/71 139/78  Pulse: 94 (!) 104  Resp:    Temp:      Last Pain:  Vitals:   08/18/16 0231  TempSrc: Axillary  PainSc: 3          Complications: No apparent anesthesia complications

## 2016-08-18 NOTE — Lactation Note (Signed)
This note was copied from a baby's chart. Lactation Consultation Note  Patient Name: Hannah Addison NaegeliMary Acosta ZOXWR'UToday's Date: 08/18/2016 Reason for consult: Follow-up assessment  I received call from RN requesting that I assess latch, etc. When I first entered room, Mom stated that she was afraid that she was going to fall asleep with the infant in the bed, so she was going to get into the recliner. "[facility]" latched w/relative ease using the teacup hold, but Mom felt pinching the entire time. Swallows were clearly evident, but Mom began falling asleep while in the recliner with infant.   Mom was placed in bed in side-lying position. The change in position did not decrease nipple discomfort. [facility] would pull off, crying, despite frequent swallows noted.   Parents are exhausted (Dad did not awaken with the infant's cries or with my presence in the room). After obtaining what EBM I could from Mom, Mom gave me permission to supplement infant with formula (via cup) to see if infant could be placated enough to sleep soundly in bassinet. Infant demonstrated good tongue mobility with cup feeding, but would at times act as if she were going to spit up. Mom was falling asleep during my time in the room. I felt that infant was a fall/accidental overlay risk if Mom were to continue to attempt to breastfeed during the night. I was also concerned that parents were unable to observe [name], who would intermittently show signs of needing to spit up (but only 1 emesis noted during my consult). Infant taken to nursery with permission from Mom to be supplemented overnight.   Mom has positional stripes on both nipples.   [name], [name] [facility] 08/18/2016, 11:37 PM

## 2016-08-18 NOTE — Anesthesia Postprocedure Evaluation (Signed)
Anesthesia Post Note  Patient: Hannah Acosta  Procedure(s) Performed: Procedure(s) (LRB): CESAREAN SECTION (N/A)  Patient location during evaluation: PACU Anesthesia Type: Epidural Level of consciousness: awake Pain management: pain level controlled Vital Signs Assessment: post-procedure vital signs reviewed and stable Respiratory status: spontaneous breathing Cardiovascular status: stable Postop Assessment: no headache, no backache, epidural receding, no signs of nausea or vomiting and patient able to bend at knees Anesthetic complications: no     Last Vitals:  Vitals:   08/18/16 0640 08/18/16 0641  BP:    Pulse: (!) 107 (!) 108  Resp: 18 20  Temp:      Last Pain:  Vitals:   08/18/16 0231  TempSrc: Axillary  PainSc: 3    Pain Goal:                 Roseline Ebarb JR,JOHN Kailei Cowens

## 2016-08-18 NOTE — Progress Notes (Signed)
Subjective: Doing well, pain controled, UCs q2-4 min  Anesthesia epidural  Pitocin Augmentation  Objective: BP (!) 181/94   Pulse 97   Temp 99.3 F (37.4 C) (Axillary)   Resp 16   Ht 5\' 5"  (1.651 m)   Wt 262 lb (118.8 kg)   SpO2 99%   BMI 43.60 kg/m    FHT:  FHR: 140's bpm, variability: moderate,  accelerations:  Present,  decelerations:  Absent UC:   regular, every 2-4 minutes VE:   Dilation: Lip/rim Effacement (%): 100 Station: 0, +1 Exam by:: Lorel Lembo   Assessment / Plan: Arrest in active phase of labor.  Pitocin Augmentation.  Combination of Contractile dystocia and FPD.  No progression in more than 3 hrs.  Fetal Wellbeing:  Category I Pain Control:  Epidural  Anticipated MOD:  Decision to proceed with Urgent C/S.  Informed consent obtained.  Marie-Lyne Lydie Stammen 08/18/2016, 3:46 AM

## 2016-08-18 NOTE — Addendum Note (Signed)
Addendum  created 08/18/16 [id number] by Elgie CongoNataliya H Jakel Alphin, CRNA   Sign clinical note

## 2016-08-18 NOTE — Lactation Note (Signed)
This note was copied from a baby's chart. Lactation Consultation Note  Patient Name: Girl Addison NaegeliMary Romanoff LKGMW'NToday's Date: 08/18/2016 baby is 11 hours and has been to the breast.  Reason for consult: Initial assessment;Other (Comment) (per parents baby recently fed at 230 pm for 20 mins . mom and dad aware to call with feeding cues for Latch socre and feeding assess with feeding cues). Per mom [facility]  Presently great grandmother holding baby. LC reviewed few breast feeding basics - importance of feeding assessment by RN or LC. Mother informed of post-discharge support and given phone number to the lactation department, including services for phone call assistance; out-patient appointments; and breastfeeding support group. List of other breastfeeding resources in the community given in the handout. Encouraged mother to call for problems or concerns related to breastfeeding.   Maternal Data Does the patient have breastfeeding experience prior to this delivery?: No  Feeding Feeding Type: Breast Fed Length of feed: 20 min (per mom and think she heard swallows )  LATCH Score/Interventions                      Lactation Tools Discussed/Used     Consult Status Consult Status: Follow-up Date: 08/18/16 Follow-up type: In-patient    Matilde SprangMargaret Ann Lavarius Doughten 08/18/2016, 3:48 PM

## 2016-08-18 NOTE — Op Note (Signed)
Preoperative diagnosis: Intrauterine pregnancy at 39 weeks and 4 days                                            Arrest of Progression in active phase   Post operative diagnosis: Same  Anesthesia: Epidural  Anesthesiologist: Dr. Arby BarretteHatchett  Procedure: Primary urgent low transverse cesarean section  Surgeon: Dr. Genia DelMarie-Lyne Nathalia Wismer  Assistant: Marlinda Mikeanya Bailey  Estimated blood loss: 800 cc  Procedure:  After being informed of the planned procedure and possible complications including bleeding, infection, injury to other organs, informed consent is obtained. The patient is taken to OR #9 and epidural anesthesia level raised without complication. She is placed in the dorsal decubitus position with the pelvis tilted to the left. She is then prepped and draped in a sterile fashion. A Foley catheter is inserted in her bladder.  After assessing adequate level of anesthesia, we infiltrate the suprapubic area with 20 cc of Marcaine 0.25 and perform a Pfannenstiel incision which is brought down sharply to the fascia. The fascia is entered in a low transverse fashion. Linea alba is dissected. Peritoneum is entered in a midline fashion. An Alexis retractor is easily positioned. Visceral peritoneum is entered in a low transverse fashion allowing us to safely retract bladder by developing a bladder flap.  The myometrium is then entered in a low transverse fashion; first with knife and then extended bluntly. Amniotic fluid is tinted meconium. We assist the birth of a Female  infant in cephalic presentation. Mouth and nose are suctioned. The baby is delivered. The cord is clamped and sectioned. The baby is given to the neonatologist present in the room.  10 cc of blood is drawn from the umbilical vein.The placenta is allowed to deliver spontaneously. It is complete and the cord has 3 vessels. Uterine revision is negative.  We proceed with closure of the myometrium in 2 layers: First with a running locked suture  of 0 Vicryl, then with a Lembert suture of 0 Vicryl imbricating the first one. Hemostasis is completed with cauterization on peritoneal edges and a figure of eight with Vicryl 0 at the right angle.  Both paracolic gutters are cleaned. Both tubes and ovaries are assessed and normal.  We confirm a satisfactory hemostasis.  Retractors and sponges are removed. Under fascia hemostasis is completed with cauterization.  The parietal peritoneum is closed with a running suture of Vicryl 2-0.  The fascia is then closed with 2 running sutures of 0 Vicryl meeting midline. The wound is irrigated with warm saline and hemostasis is completed with cauterization. The adipose tissue is approximated with a running Plain 2-0.  The skin is closed with a subcuticular suture of 3-0 Monocryl and Dermabond.  A Honeycomb dressing is added.  Instrument and sponge count is complete x2. Estimated blood loss is 800 cc.  The procedure is well tolerated by the patient who is taken to recovery room in a well and stable condition.  female baby named [facility] was born at 4:38 am and received an Apgar of 9  at 1 minute and 9 at 5 minutes. Weight was pending.    Specimen: Placenta sent to L & D   Genia DelMarie-Lyne Augusto Deckman MD 11/12/20173:53 AM

## 2016-08-18 NOTE — Anesthesia Postprocedure Evaluation (Signed)
Anesthesia Post Note  Patient: Hannah Acosta  Procedure(s) Performed: Procedure(s) (LRB): CESAREAN SECTION (N/A)  Patient location during evaluation: Mother Baby Anesthesia Type: Epidural Level of consciousness: awake and alert Pain management: pain level controlled Vital Signs Assessment: post-procedure vital signs reviewed and stable Respiratory status: spontaneous breathing and nonlabored ventilation Cardiovascular status: stable Postop Assessment: no headache, patient able to bend at knees, no backache, no signs of nausea or vomiting, epidural receding and adequate PO intake Anesthetic complications: no     Last Vitals:  Vitals:   08/18/16 0753 08/18/16 0850  BP: (!) 145/68 131/62  Pulse: (!) 110 (!) 101  Resp: 18 18  Temp: 36.3 C 36.6 C    Last Pain:  Vitals:   08/18/16 0850  TempSrc: Oral  PainSc: 0-No pain   Pain Goal:                 Land O'LakesMalinova,Nissa Stannard Hristova

## 2016-08-19 DIAGNOSIS — D62 Acute posthemorrhagic anemia: Secondary | ICD-10-CM | POA: Diagnosis not present

## 2016-08-19 LAB — CBC
HCT: 28.4 % — ABNORMAL LOW (ref 36.0–46.0)
Hemoglobin: 9.5 g/dL — ABNORMAL LOW (ref 12.0–15.0)
MCH: 28.9 pg (ref 26.0–34.0)
MCHC: 33.5 g/dL (ref 30.0–36.0)
MCV: 86.3 fL (ref 78.0–100.0)
Platelets: 158 10*3/uL (ref 150–400)
RBC: 3.29 MIL/uL — ABNORMAL LOW (ref 3.87–5.11)
RDW: 14.6 % (ref 11.5–15.5)
WBC: 13.3 10*3/uL — ABNORMAL HIGH (ref 4.0–10.5)

## 2016-08-19 MED ORDER — MAGNESIUM OXIDE 400 (241.3 MG) MG PO TABS
400.0000 mg | ORAL_TABLET | Freq: Every day | ORAL | Status: DC
  Administered 2016-08-19 – 2016-08-21 (×3): 400 mg via ORAL
  Filled 2016-08-19 (×4): qty 1

## 2016-08-19 MED ORDER — POLYSACCHARIDE IRON COMPLEX 150 MG PO CAPS
150.0000 mg | ORAL_CAPSULE | Freq: Every day | ORAL | Status: DC
  Administered 2016-08-19 – 2016-08-21 (×3): 150 mg via ORAL
  Filled 2016-08-19 (×3): qty 1

## 2016-08-19 NOTE — Progress Notes (Signed)
MOB was referred for history of depression/anxiety. * Referral screened out by Clinical Social Worker because none of the following criteria appear to apply: ~ History of anxiety/depression during this pregnancy, or of post-partum depression. ~ Diagnosis of anxiety and/or depression within last 3 years OR * MOB's symptoms currently being treated with medication and/or therapy. Please contact the Clinical Social Worker if needs arise, or if MOB requests.  Waleska Buttery Boyd-Gilyard, MSW, LCSW Clinical Social Work [phone] 

## 2016-08-19 NOTE — Lactation Note (Signed)
This note was copied from a baby's chart. Lactation Consultation Note  Patient Name: Hannah Addison NaegeliMary Acosta ZOXWR'UToday's Date: 08/19/2016 Reason for consult: Follow-up assessment;Breast/nipple pain  Visited with this first time Mom, baby 30 hrs old delivered by C/S. Mom was exhausted last night, so baby went to CN and was fed formula by cup.  Mom states she slept well.  Baby has latched on twice this morning since.  Assisted with positioning and latching baby in football hold on right side.  Mom needing some guidance with supporting and sandwiching breast to facilitate a deep areolar latch, swallows identified.  Hand expression for colostrum done to entice baby.  Nipples a little pink, but no skin breakdown noted.  Lots of basic teaching done.  Encouraged Mom to continue STS and cue based feedings. To ask for assistance as needed.  Lactation to follow up tomorrow.    Consult Status Consult Status: Follow-up Date: 08/20/16 Follow-up type: In-patient    Judee ClaraSmith, Liani Caris E 08/19/2016, 11:22 AM

## 2016-08-19 NOTE — Progress Notes (Signed)
Subjective: POD# 1 Information for the patient's newborn:  Hannah Acosta, Girl Hannah Acosta [[id number]][[id number]]  female  Baby name: Hannah Acosta  Reports feeling well, rested last night / baby went to nursery. Feeding: breast Patient reports tolerating PO.  Breast symptoms: no pain, baby sleepy at breast. Pain controlled withPO meds Denies HA/SOB/C/P/N/V/dizziness. Flatus present. She reports vaginal bleeding as normal, without clots.  She is ambulating, urinating without difficulty.     Objective:   VS:    Vitals:   08/18/16 1000 08/18/16 1820 08/19/16 0150 08/19/16 0641  BP: 136/72 123/73 126/67 (!) 145/80  Pulse: 94 81 84 (!) 101  Resp: 18 18 18 16   Temp: 98.1 F (36.7 C) 98.3 F (36.8 C) 98.2 F (36.8 C) 98.5 F (36.9 C)  TempSrc: Oral Oral Oral Oral  SpO2: 100% 100% 99%   Weight:      Height:         Intake/Output Summary (Last 24 hours) at 08/19/16 0915 Last data filed at 08/18/16 2322  Gross per 24 hour  Intake             1325 ml  Output             1525 ml  Net             -200 ml        Recent Labs  08/17/16 1945 08/19/16 0511  WBC 18.8* 13.3*  HGB 12.1 9.5*  HCT 35.0* 28.4*  PLT 237 158     Blood type: --/--/O POS, O POS (11/11 1945)  Rubella: Immune (04/11 0000)     Physical Exam:  General: alert, cooperative and no distress CV: Regular rate and rhythm Resp: clear Abdomen: soft, nontender, normal bowel sounds Incision: clean, dry and intact Uterine Fundus: firm, below umbilicus, nontender Lochia: minimal Ext: edema +, no tenderness or calf pain      Assessment/Plan: 30 y.o.   POD# 1. G1P1001                  Principal Problem:   Postpartum care following cesarean delivery (11/12) Active Problems:   Cesarean delivery delivered   Acute blood loss anemia  - asymptomatic  - started oral Fe and mag-ox  Doing well, stable.               Advance diet as tolerated Encourage rest when baby rests Breastfeeding support Encourage to ambulate Routine post-op  care  Neta Mendsaniela C Teshara Moree, CNM, MSN 08/19/2016, 9:15 AM

## 2016-08-20 MED ORDER — BISACODYL 10 MG RE SUPP
10.0000 mg | Freq: Every day | RECTAL | Status: DC | PRN
  Administered 2016-08-20: 10 mg via RECTAL
  Filled 2016-08-20: qty 1

## 2016-08-20 NOTE — Progress Notes (Addendum)
S/P Primary Cesarean Delivery for Arrest of Dilatation in Active Phase Subjective: POD# 2 Information for the patient's newborn:  Hannah Acosta, Hannah Hannah [[id number]][[id number]]  female  Baby name: Hannah Acosta  Reports feeling well. Feeding: breast Patient reports tolerating PO.  Breast symptoms: (+) colostrum. Pain controlled with ibuprofen (OTC) and narcotic analgesics including Percocet Denies HA/SOB/C/P/N/V/dizziness. Flatus present. No BM - since Thursday 11/9. She reports vaginal bleeding as normal, without clots.  She is ambulating, urinating without difficulty.     Objective:   VS:    Vitals:   08/19/16 0150 08/19/16 0641 08/19/16 1853 08/20/16 0640  BP: 126/67 (!) 145/80 129/66 123/72  Pulse: 84 (!) 101 97 84  Resp: 18 16 17 18   Temp: 98.2 F (36.8 C) 98.5 F (36.9 C) 98 F (36.7 C) 98.1 F (36.7 C)  TempSrc: Oral Oral Oral Oral  SpO2: 99%     Weight:      Height:          Recent Labs  08/17/16 1945 08/19/16 0511  WBC 18.8* 13.3*  HGB 12.1 9.5*  HCT 35.0* 28.4*  PLT 237 158     Blood type: O POS (11/11 1945)  Rubella: Immune (04/11 0000)     Physical Exam:  General: alert, cooperative and no distress CV: Regular rate and rhythm Resp: clear Abdomen: soft, nontender, hypoactive bowel sounds, (+) distension Incision: clean, dry and intact Uterine Fundus: firm, 1 FB below umbilicus, nontender Lochia: minimal Ext: edema +, no tenderness or calf pain   Assessment/Plan: 30 y.o.   POD# 2. G1P1001                  Principal Problem:   Postpartum care following cesarean delivery (11/12) Indication: arrest of dilation Active Problems:   Cesarean delivery delivered   Acute blood loss anemia  - asymptomatic  - continue Niferex 150 mg po daily and Magnesium Oxide 400 mg po  Doing well, stable.               Regular diet as tolerated Encourage rest when baby rests Breastfeeding support Encourage to ambulate 2-3 times in hallway Routine post-op care Ducolax  suppository prn constipation Anticipate D/C home tomorrow  Hannah Acosta, Hannah Acosta, M, CNM, MSN 08/20/2016, 8:29 AM

## 2016-08-20 NOTE — Lactation Note (Signed)
This note was copied from a baby's chart. Lactation Consultation Note LC [facility], Danielle to encouraged mom to increase supplementation as tolerated to baby now 65 hours of age.    Patient Name: Hannah Addison NaegeliMary Lung WUJWJ'XToday's Date: 08/20/2016     Maternal Data    Feeding Feeding Type: Formula Length of feed: 15 min  LATCH Score/Interventions                      Lactation Tools Discussed/Used Tools: Nipple Shields Nipple shield size: 20   Consult Status      Hannah Acosta, Hannah Acosta 08/20/2016, 10:10 PM

## 2016-08-20 NOTE — Lactation Note (Signed)
This note was copied from a baby's chart. Lactation Consultation Note Mom's nipples are cracked, positional stripes, and painful. Comfort gels given. Has short shaft, but soft breast tissue and compressible.  Noted baby has upper labial frenulum, lower frenulum and mid anterior frenulum visible.  Baby acts as if starving. Parents has been supplementing formula w/foley cup.  Baby very jaundice. Lab into drew serum. In football hold, latched baby w/#20 NS. BF well. No colostrum noted in NS, although heard swallow.  SNS initiated w/Alimentum, instructions, demonstration, then mom applying her self and latching baby.  Gave shells to wear in bra to assist in everting nipple.  Spent a lot of time teaching and latching baby. Baby would resist at times d/t frantic wanting to suck.  Parents states they feel comfortable w/set up of SNS and plan for feeding.  Will ask on coming RN to set up DEBP that LC brought into room.  Patient Name: Hannah Addison NaegeliMary Acosta ZOXWR'UToday's Date: 08/20/2016 Reason for consult: Follow-up assessment;Difficult latch;Breast/nipple pain;Hyperbilirubinemia   Maternal Data    Feeding Feeding Type: Formula Length of feed: 30 min  LATCH Score/Interventions Latch: Repeated attempts needed to sustain latch, nipple held in mouth throughout feeding, stimulation needed to elicit sucking reflex. Intervention(s): Adjust position;Assist with latch;Breast massage;Breast compression  Audible Swallowing: A few with stimulation Intervention(s): Skin to skin;Hand expression;Alternate breast massage  Type of Nipple: Everted at rest and after stimulation  Comfort (Breast/Nipple): Engorged, cracked, bleeding, large blisters, severe discomfort Problem noted: Cracked, bleeding, blisters, bruises Intervention(s): Hand pump;Expressed breast milk to nipple  Problem noted: Mild/Moderate discomfort Interventions (Mild/moderate discomfort): Hand massage;Hand expression;Post-pump;Comfort gels;Breast  shields  Hold (Positioning): Full assist, staff holds infant at breast Intervention(s): Breastfeeding basics reviewed;Support Pillows;Position options;Skin to skin  LATCH Score: 4  Lactation Tools Discussed/Used Tools: Shells;Nipple Shields;Pump;Supplemental Nutrition System;Comfort gels Nipple shield size: 20 Shell Type: Inverted Breast pump type: Manual Initiated by::  (RN asked to set up)   Consult Status Consult Status: Follow-up Date: 08/20/16 Follow-up type: In-patient    Hannah Acosta, Diamond NickelLAURA G 08/20/2016, 7:08 AM

## 2016-08-20 NOTE — Plan of Care (Signed)
Problem: Bowel/Gastric: Goal: Gastrointestinal status will improve Outcome: Progressing Patient had a large bowel movement after a bisacodyl suppository was given. She reports feeling much better.

## 2016-08-21 MED ORDER — OXYCODONE HCL 5 MG PO TABS: 5.0000 mg | ORAL_TABLET | ORAL | 0 refills | Status: DC | PRN

## 2016-08-21 MED ORDER — MAGNESIUM OXIDE 400 (241.3 MG) MG PO TABS: 400.0000 mg | ORAL_TABLET | Freq: Every day | ORAL | 0 refills | Status: DC

## 2016-08-21 MED ORDER — POLYSACCHARIDE IRON COMPLEX 150 MG PO CAPS: 150.0000 mg | ORAL_CAPSULE | Freq: Every day | ORAL | 0 refills | Status: DC

## 2016-08-21 MED ORDER — IBUPROFEN 600 MG PO TABS: 600.0000 mg | ORAL_TABLET | Freq: Four times a day (QID) | ORAL | 0 refills | Status: DC

## 2016-08-21 NOTE — Discharge Summary (Signed)
OB Discharge Summary  Patient Name: Hannah HindsMary A Tineo DOB: [birth year] MRN: [id number]  Date of admission: 08/17/2016  Admitting diagnosis: 39.3 WKS, CTXS, WATER BROKE Intrauterine pregnancy: 8288w4d        Date of discharge: 08/21/2016    Discharge diagnosis: POD 3 s/p CS arrest of active labor / term delivery   Prenatal history: G1P1001   EDC : 08/21/2016, by Other Basis  [facility]  Primary provider : Taavon Prenatal course uncomplicated   Prenatal Labs: ABO, Rh: --/--/O POS, O POS (11/11 1945)  Antibody: NEG (11/11 1945) Rubella: Immune (04/11 0000)  RPR: Non Reactive (11/11 1945)  HBsAg: Negative (04/11 0000)  HIV: Non-reactive (04/11 0000)  GBS: Negative (10/13 0000)                                    Hospital course:  Onset of Labor With Unplanned C/S  30 y.o. yo G1P1001 at 8788w4d was admitted in Active Labor on 08/17/2016. Patient had a labor course significant for arrest of active labor 5cm. Membrane Rupture Time/Date: 10:00 AM ,08/17/2016   The patient went for cesarean section due to Arrest of Dilation, and delivered a Viable infant,08/18/2016  Details of operation can be found in separate operative note. Patient had an uncomplicated postpartum course.  She is ambulating,tolerating a regular diet, passing flatus, and urinating well.  Patient is discharged home in stable condition 09/12/16.  Augmentation: Pitocin Delivering PROVIDER: LAVOIE, MARIE-LYNE                                                            Complications: None  Newborn Data: Live born female  Birth Weight: 8 lb 4.8 oz (3765 g) APGAR: 9, 9  Baby Feeding: Breast Disposition:home with mother  Post partum procedures:none  Postpartum contraception: Not Discussed    Labs: Lab Results  Component Value Date   WBC 13.3 (H) 08/19/2016   HGB 9.5 (L) 08/19/2016   HCT 28.4 (L) 08/19/2016   MCV 86.3 08/19/2016   PLT 158 08/19/2016   CMP Latest Ref Rng &  Units 08/17/2016  Glucose 65 - 99 mg/dL 409(W110(H)  BUN 6 - 20 mg/dL 9  Creatinine 1.190.44 - 1.471.00 mg/dL 8.290.49  Sodium 562135 - 130145 mmol/L 136  Potassium 3.5 - 5.1 mmol/L 4.0  Chloride 101 - 111 mmol/L 103  CO2 22 - 32 mmol/L 22  Calcium 8.9 - 10.3 mg/dL 9.5  Total Protein 6.5 - 8.1 g/dL 6.7  Total Bilirubin 0.3 - 1.2 mg/dL 0.5  Alkaline Phos 38 - 126 U/L 133(H)  AST 15 - 41 U/L 21  ALT 14 - 54 U/L 17    Physical Exam @ time of discharge:  Vitals:   08/19/16 1853 08/20/16 0640 08/20/16 1754 08/21/16 0543  BP: 129/66 123/72 132/72 137/88  Pulse: 97 84 92 85  Resp: 17 18 18 20   Temp: 98 F (36.7 C) 98.1 F (36.7 C) 98.4 F (36.9 C) 97.4 F (36.3 C)  TempSrc: Oral Oral Oral Oral  SpO2:      Weight:      Height:        General: alert, cooperative and no distress Lochia:  appropriate Uterine Fundus: firm Perineum: intact Incision: Healing well with no significant drainage Extremities: DVT Evaluation: No evidence of DVT seen on physical exam.   Discharge instructions:  "Baby and Me Booklet" and [facility] Booklet  Discharge Medications:    Medication List    TAKE these medications   ibuprofen 600 MG tablet Commonly known as:  ADVIL,MOTRIN Take 1 tablet (600 mg total) by mouth every 6 (six) hours.   iron polysaccharides 150 MG capsule Commonly known as:  NIFEREX Take 1 capsule (150 mg total) by mouth daily. Start taking on:  08/22/2016   magnesium oxide 400 (241.3 Mg) MG tablet Commonly known as:  MAG-OX Take 1 tablet (400 mg total) by mouth daily. Start taking on:  08/22/2016   oxyCODONE 5 MG immediate release tablet Commonly known as:  Oxy IR/ROXICODONE Take 1 tablet (5 mg total) by mouth every 4 (four) hours as needed (pain scale 4-7).   prenatal multivitamin Tabs tablet Take 1 tablet by mouth daily at 12 noon.   sertraline 50 MG tablet Commonly known as:  ZOLOFT Take 50 mg by mouth daily.       Diet: routine diet  Activity: Advance as tolerated. Pelvic  rest x 6 weeks.   Follow up:6 weeks    Signed: Marlinda MikeBAILEY, Dung Prien CNM, MSN, [facility] 08/21/2016, 1:57 PM

## 2016-08-21 NOTE — Progress Notes (Signed)
POSTOPERATIVE DAY # 3 S/P CS   S:         Reports feeling better             Tolerating po intake / no nausea / no vomiting / + flatus / no BM             Bleeding is light             Pain controlled with motrin and oxycodone             Up ad lib / ambulatory/ voiding QS    O:  VS: BP 137/88 (BP Location: Right Arm)   Pulse 85   Temp 97.4 F (36.3 C) (Oral)   Resp 20   Ht 5\' 5"  (1.651 m)   Wt 118.8 kg (262 lb)   SpO2 99%   Breastfeeding? Unknown   BMI 43.60 kg/m    LABS:               Recent Labs  08/19/16 0511  WBC 13.3*  HGB 9.5*  PLT 158               Bloodtype: --/--/O POS, O POS (11/11 1945)  Rubella: Immune (04/11 0000)                                    Physical Exam:             Alert and Oriented X3  Lungs: Clear and unlabored  Heart: regular rate and rhythm / no mumurs  Abdomen: soft, non-tender, non-distended             Fundus: firm, non-tender, Ueven             Dressing intact              Incision:  approximated with suture / no erythema / no ecchymosis / no drainage  Perineum: intact  Lochia: light   A:        POD # 3 S/P CS             P:        Routine postoperative care              Plan for DC to home today - [facility] booklet instructions reviewed     Marlinda MikeBAILEY, [place] Mcphee CNM, MSN, [facility] 08/21/2016, 1:54 PM

## 2016-08-21 NOTE — Lactation Note (Addendum)
This note was copied from a baby's chart. Lactation Consultation Note  Patient Name: Hannah Addison NaegeliMary Acosta WUJWJ'XToday's Date: 08/21/2016 Reason for consult: Follow-up assessment  Infant w/loud, frequent swallows at the breast, but "[facility]" still acts if she's not getting enough. Morning weight does not seem likely (a weight gain of 11+oz over 24 hours), so parents agreeable to a reweigh & we'll do pre- & post-weights to assess milk transfer. Pre-weight shows that infant is at 7% below BW. Post-weight (after a 10-min feeding w/frequent swallows) showed no transfer.   [facility] was supplemented at breast w/a nipple shield (size 20) & 5Fr + syringe (parents did not like the starter SNS--infant took too long to feed, 1.5 hours). Infant would drink the supplement, but would still act hungry. Hannah Acosta's swallows were quite loud when being supplemented. A bottle was attempted, but [facility] did not take the bottle well, even with chin support (after 10 minutes, she had only drunk 1mL).   Mom did pump during consult & she obtained 18mL from the L breast & 2mL from the R breast. Mom's breasts feel soft. Infant is now 880hrs old. There may a delay in Lactogenesis II b/c of obesity.   Mom's nipples w/compression stripes bilaterally. Mom does have a DEBP at home.   [facility] was called & Dr. Vaughan BastaSummer was given an update at [id number]. In light of Corrie DandyMary Acosta's feeding difficulties, etc. I did suggest that she be evaluated for a posterior tongue-tie or evaluated by a speech/occupational therapist to determine cause of loud swallowing.   Plan: 1. Mom to pump around the clock. 2. Parents to supplement [facility] with the beginning of every feeding & until she is satiated.  3. Parents for bili check tomorrow.   Hannah Acosta, Hannah Acosta 08/21/2016, 1:41 PM

## 2016-08-22 NOTE — Progress Notes (Signed)
Post discharge chart review completed.  

## 2016-10-12 ENCOUNTER — Telehealth (HOSPITAL_COMMUNITY): Payer: Self-pay | Admitting: Lactation Services

## 2016-10-12 NOTE — Telephone Encounter (Signed)
Mom called wanting information about how to increase her milk supply.   Infant is 718 weeks old and is feeding every 2-3 hrs about 9-10 times per day. Mom reports she has been exclusively pumping for several weeks and is using hands-on pumping method. Mom stated she was pumping every 2 hrs but is not pumping that much only about 5-6 times total per day.   LC told mom she needed to increase the number of times she pumps per day to equal the number of times the infant is feeding (at least 8 times per day).  Explain supply and demand.   Discussed galactagogues (Fenugreek, Mother's Milk, Moringa, and Lactation Support herbal supplement) but explained increasing the number of pumpings were very important. Also suggested power pumping for 1 hr in the evenings for a few evenings to increase demand. Mom appreciative of suggestions given and plans to increase her number of pumpings per day.

## 2016-11-14 DIAGNOSIS — J988 Other specified respiratory disorders: Secondary | ICD-10-CM | POA: Diagnosis not present

## 2017-05-15 DIAGNOSIS — N3941 Urge incontinence: Secondary | ICD-10-CM | POA: Diagnosis not present

## 2017-12-31 DIAGNOSIS — Z01419 Encounter for gynecological examination (general) (routine) without abnormal findings: Secondary | ICD-10-CM | POA: Diagnosis not present

## 2017-12-31 DIAGNOSIS — Z6841 Body Mass Index (BMI) 40.0 and over, adult: Secondary | ICD-10-CM | POA: Diagnosis not present

## 2018-01-08 DIAGNOSIS — R109 Unspecified abdominal pain: Secondary | ICD-10-CM | POA: Diagnosis not present

## 2018-01-20 DIAGNOSIS — R05 Cough: Secondary | ICD-10-CM | POA: Diagnosis not present

## 2018-01-20 DIAGNOSIS — J029 Acute pharyngitis, unspecified: Secondary | ICD-10-CM | POA: Diagnosis not present

## 2018-01-20 DIAGNOSIS — B349 Viral infection, unspecified: Secondary | ICD-10-CM | POA: Diagnosis not present

## 2018-04-22 DIAGNOSIS — H66001 Acute suppurative otitis media without spontaneous rupture of ear drum, right ear: Secondary | ICD-10-CM | POA: Diagnosis not present

## 2018-04-22 DIAGNOSIS — R05 Cough: Secondary | ICD-10-CM | POA: Diagnosis not present

## 2018-04-22 DIAGNOSIS — J0100 Acute maxillary sinusitis, unspecified: Secondary | ICD-10-CM | POA: Diagnosis not present

## 2018-04-22 DIAGNOSIS — R062 Wheezing: Secondary | ICD-10-CM | POA: Diagnosis not present

## 2018-05-26 DIAGNOSIS — S80861A Insect bite (nonvenomous), right lower leg, initial encounter: Secondary | ICD-10-CM | POA: Diagnosis not present

## 2018-05-26 DIAGNOSIS — W57XXXA Bitten or stung by nonvenomous insect and other nonvenomous arthropods, initial encounter: Secondary | ICD-10-CM | POA: Diagnosis not present

## 2018-05-26 DIAGNOSIS — L739 Follicular disorder, unspecified: Secondary | ICD-10-CM | POA: Diagnosis not present

## 2018-06-04 DIAGNOSIS — W57XXXA Bitten or stung by nonvenomous insect and other nonvenomous arthropods, initial encounter: Secondary | ICD-10-CM | POA: Diagnosis not present

## 2018-06-04 DIAGNOSIS — L739 Follicular disorder, unspecified: Secondary | ICD-10-CM | POA: Diagnosis not present

## 2018-06-04 DIAGNOSIS — S80861A Insect bite (nonvenomous), right lower leg, initial encounter: Secondary | ICD-10-CM | POA: Diagnosis not present

## 2018-08-14 DIAGNOSIS — Z3687 Encounter for antenatal screening for uncertain dates: Secondary | ICD-10-CM | POA: Diagnosis not present

## 2018-08-14 DIAGNOSIS — Z3201 Encounter for pregnancy test, result positive: Secondary | ICD-10-CM | POA: Diagnosis not present

## 2018-08-19 DIAGNOSIS — F411 Generalized anxiety disorder: Secondary | ICD-10-CM | POA: Diagnosis not present

## 2018-08-19 DIAGNOSIS — F438 Other reactions to severe stress: Secondary | ICD-10-CM | POA: Diagnosis not present

## 2018-08-24 DIAGNOSIS — Z3201 Encounter for pregnancy test, result positive: Secondary | ICD-10-CM | POA: Diagnosis not present

## 2018-08-26 DIAGNOSIS — F438 Other reactions to severe stress: Secondary | ICD-10-CM | POA: Diagnosis not present

## 2018-08-26 DIAGNOSIS — F411 Generalized anxiety disorder: Secondary | ICD-10-CM | POA: Diagnosis not present

## 2018-08-31 DIAGNOSIS — Z3689 Encounter for other specified antenatal screening: Secondary | ICD-10-CM | POA: Diagnosis not present

## 2018-08-31 DIAGNOSIS — Z3481 Encounter for supervision of other normal pregnancy, first trimester: Secondary | ICD-10-CM | POA: Diagnosis not present

## 2018-09-24 DIAGNOSIS — F438 Other reactions to severe stress: Secondary | ICD-10-CM | POA: Diagnosis not present

## 2018-09-24 DIAGNOSIS — F411 Generalized anxiety disorder: Secondary | ICD-10-CM | POA: Diagnosis not present

## 2018-09-28 DIAGNOSIS — Z3481 Encounter for supervision of other normal pregnancy, first trimester: Secondary | ICD-10-CM | POA: Diagnosis not present

## 2018-10-09 DIAGNOSIS — F438 Other reactions to severe stress: Secondary | ICD-10-CM | POA: Diagnosis not present

## 2018-10-09 DIAGNOSIS — F411 Generalized anxiety disorder: Secondary | ICD-10-CM | POA: Diagnosis not present

## 2018-10-14 DIAGNOSIS — Z3481 Encounter for supervision of other normal pregnancy, first trimester: Secondary | ICD-10-CM | POA: Diagnosis not present

## 2018-10-14 DIAGNOSIS — Z361 Encounter for antenatal screening for raised alphafetoprotein level: Secondary | ICD-10-CM | POA: Diagnosis not present

## 2018-10-14 DIAGNOSIS — Z3482 Encounter for supervision of other normal pregnancy, second trimester: Secondary | ICD-10-CM | POA: Diagnosis not present

## 2018-10-14 LAB — OB RESULTS CONSOLE GC/CHLAMYDIA
Chlamydia: NEGATIVE
Gonorrhea: NEGATIVE

## 2018-10-14 LAB — OB RESULTS CONSOLE ABO/RH: RH Type: POSITIVE

## 2018-10-14 LAB — OB RESULTS CONSOLE RUBELLA ANTIBODY, IGM: Rubella: IMMUNE

## 2018-10-14 LAB — OB RESULTS CONSOLE HIV ANTIBODY (ROUTINE TESTING): HIV: NONREACTIVE

## 2018-10-14 LAB — OB RESULTS CONSOLE RPR: RPR: NONREACTIVE

## 2018-10-14 LAB — OB RESULTS CONSOLE ANTIBODY SCREEN: Antibody Screen: NEGATIVE

## 2018-10-14 LAB — OB RESULTS CONSOLE HEPATITIS B SURFACE ANTIGEN: Hepatitis B Surface Ag: NEGATIVE

## 2018-10-26 DIAGNOSIS — M791 Myalgia, unspecified site: Secondary | ICD-10-CM | POA: Diagnosis not present

## 2018-10-26 DIAGNOSIS — R51 Headache: Secondary | ICD-10-CM | POA: Diagnosis not present

## 2018-10-26 DIAGNOSIS — R531 Weakness: Secondary | ICD-10-CM | POA: Diagnosis not present

## 2018-10-26 DIAGNOSIS — J069 Acute upper respiratory infection, unspecified: Secondary | ICD-10-CM | POA: Diagnosis not present

## 2018-10-26 DIAGNOSIS — Z1159 Encounter for screening for other viral diseases: Secondary | ICD-10-CM | POA: Diagnosis not present

## 2018-10-26 DIAGNOSIS — R5383 Other fatigue: Secondary | ICD-10-CM | POA: Diagnosis not present

## 2018-10-30 DIAGNOSIS — F438 Other reactions to severe stress: Secondary | ICD-10-CM | POA: Diagnosis not present

## 2018-10-30 DIAGNOSIS — F411 Generalized anxiety disorder: Secondary | ICD-10-CM | POA: Diagnosis not present

## 2018-11-03 DIAGNOSIS — Z3482 Encounter for supervision of other normal pregnancy, second trimester: Secondary | ICD-10-CM | POA: Diagnosis not present

## 2018-11-03 DIAGNOSIS — R030 Elevated blood-pressure reading, without diagnosis of hypertension: Secondary | ICD-10-CM | POA: Diagnosis not present

## 2018-11-03 DIAGNOSIS — J029 Acute pharyngitis, unspecified: Secondary | ICD-10-CM | POA: Diagnosis not present

## 2018-11-03 DIAGNOSIS — Z3480 Encounter for supervision of other normal pregnancy, unspecified trimester: Secondary | ICD-10-CM | POA: Diagnosis not present

## 2018-11-03 DIAGNOSIS — Z363 Encounter for antenatal screening for malformations: Secondary | ICD-10-CM | POA: Diagnosis not present

## 2018-11-09 DIAGNOSIS — O212 Late vomiting of pregnancy: Secondary | ICD-10-CM | POA: Diagnosis not present

## 2018-11-09 DIAGNOSIS — Z3A20 20 weeks gestation of pregnancy: Secondary | ICD-10-CM | POA: Diagnosis not present

## 2018-11-09 DIAGNOSIS — O219 Vomiting of pregnancy, unspecified: Secondary | ICD-10-CM | POA: Diagnosis not present

## 2018-11-13 DIAGNOSIS — F411 Generalized anxiety disorder: Secondary | ICD-10-CM | POA: Diagnosis not present

## 2018-11-13 DIAGNOSIS — F438 Other reactions to severe stress: Secondary | ICD-10-CM | POA: Diagnosis not present

## 2019-02-22 ENCOUNTER — Other Ambulatory Visit: Payer: Self-pay | Admitting: Obstetrics & Gynecology

## 2019-02-24 ENCOUNTER — Telehealth (HOSPITAL_COMMUNITY): Payer: Self-pay | Admitting: *Deleted

## 2019-02-24 ENCOUNTER — Encounter (HOSPITAL_COMMUNITY): Payer: Self-pay | Admitting: *Deleted

## 2019-02-24 NOTE — Telephone Encounter (Signed)
Preadmission screen  

## 2019-02-25 ENCOUNTER — Other Ambulatory Visit: Payer: Self-pay | Admitting: Obstetrics & Gynecology

## 2019-03-03 ENCOUNTER — Other Ambulatory Visit (HOSPITAL_COMMUNITY)
Admission: RE | Admit: 2019-03-03 | Discharge: 2019-03-03 | Disposition: A | Discharge status: Home / Self Care | Payer: Medicaid Other | Source: Ambulatory Visit | Attending: Obstetrics & Gynecology | Admitting: Obstetrics & Gynecology

## 2019-03-03 ENCOUNTER — Other Ambulatory Visit: Payer: Self-pay

## 2019-03-03 DIAGNOSIS — Z1159 Encounter for screening for other viral diseases: Secondary | ICD-10-CM | POA: Diagnosis present

## 2019-03-03 NOTE — MAU Note (Signed)
Covid swab collected. Pt tolerated well. PT asymptomatic 

## 2019-03-04 LAB — NOVEL CORONAVIRUS, NAA (HOSP ORDER, SEND-OUT TO REF LAB; TAT 18-24 HRS): SARS-CoV-2, NAA: NOT DETECTED

## 2019-03-05 ENCOUNTER — Other Ambulatory Visit (HOSPITAL_COMMUNITY): Payer: Self-pay | Admitting: *Deleted

## 2019-03-05 DIAGNOSIS — F329 Major depressive disorder, single episode, unspecified: Secondary | ICD-10-CM | POA: Diagnosis present

## 2019-03-05 DIAGNOSIS — O34219 Maternal care for unspecified type scar from previous cesarean delivery: Secondary | ICD-10-CM | POA: Diagnosis present

## 2019-03-05 DIAGNOSIS — F32A Depression, unspecified: Secondary | ICD-10-CM | POA: Diagnosis present

## 2019-03-05 DIAGNOSIS — O139 Gestational [pregnancy-induced] hypertension without significant proteinuria, unspecified trimester: Secondary | ICD-10-CM | POA: Diagnosis present

## 2019-03-05 NOTE — H&P (Signed)
Hannah Acosta is a 33 y.o. female presenting for trial of labor after cesarean section and induction of labor for gestational hypertension. OB History    Gravida  2   Para  1   Term  1   Preterm      AB      Living  1     SAB      TAB      Ectopic      Multiple  0   Live Births  1          Past Medical History:  Diagnosis Date  . Anemia   . Anxiety   . Depression   . Pregnancy induced hypertension    Past Surgical History:  Procedure Laterality Date  . CESAREAN SECTION N/A 08/18/2016   Procedure: CESAREAN SECTION;  Surgeon: Genia DelMarie-Lyne Lavoie, MD;  [facility];  Service: Obstetrics;  Laterality: N/A;   Family History: family history includes Alcohol abuse in her father and mother; Anxiety disorder in her maternal grandmother, mother, and sister; Cancer in her maternal grandmother; Early death in her maternal uncle; Hypertension in her maternal grandmother and mother. Social History:  reports that she has never smoked. She has never used smokeless tobacco. She reports that she does not drink alcohol or use drugs.     Maternal Diabetes: No Genetic Screening: Normal Maternal Ultrasounds/Referrals: Normal Fetal Ultrasounds or other Referrals:  Other: Antenatal testing from 32 weeks for obesity and gestational hypertension  Maternal Substance Abuse:  No Significant Maternal Medications:  None Significant Maternal Lab Results:  None Other Comments:  None  Review of Systems  Eyes: Negative for blurred vision.  Gastrointestinal: Negative for abdominal pain.  Neurological: Negative for headaches.  All other systems reviewed and are negative.  Maternal Medical History:  Fetal activity: Perceived fetal activity is normal.   Last perceived fetal movement was within the past hour.    Prenatal complications: PIH.   Prenatal Complications - Diabetes: none.    Dilation: Fingertip Effacement (%): Thick Exam by:: Ethelene BrownsE. Monasia Lair, CNM  Vitals:   03/06/19 0056 03/06/19 0107 03/06/19 0130 03/06/19 0201  BP: (!) 154/93  135/88 (!) 148/79  Pulse: 100  89 88  Resp: 19  18 18   Temp: 98.9 F (37.2 C)     TempSrc: Oral     Weight:  129.5 kg    Height:  5\' 5"  (1.651 m)     Results for orders placed or performed during the hospital encounter of 03/06/19 (from the past 24 hour(s))  Type and screen     Status: None   Collection Time: 03/06/19  1:10 AM  Result Value Ref Range   ABO/RH(D) O POS    Antibody Screen NEG    Sample Expiration      03/09/2019,2359 Performed at [facility], [address] [place], [place] [address]   ABO/Rh     Status: None (Preliminary result)   Collection Time: 03/06/19  1:10 AM  Result Value Ref Range   ABO/RH(D)      O POS Performed at [facility], [address] [place], [place] [address]   Comprehensive metabolic panel     Status: Abnormal   Collection Time: 03/06/19  1:24 AM  Result Value Ref Range   Sodium 135 135 - 145 mmol/L   Potassium 3.6 3.5 - 5.1 mmol/L   Chloride 103 98 - 111 mmol/L   CO2 20 (L) 22 - 32 mmol/L  Glucose, Bld 102 (H) 70 - 99 mg/dL   BUN <5 (L) 6 - 20 mg/dL   Creatinine, Ser 0.92 (L) 0.44 - 1.00 mg/dL   Calcium 9.6 8.9 - 33.0 mg/dL   Total Protein 6.0 (L) 6.5 - 8.1 g/dL   Albumin 2.8 (L) 3.5 - 5.0 g/dL   AST 19 15 - 41 U/L   ALT 16 0 - 44 U/L   Alkaline Phosphatase 91 38 - 126 U/L   Total Bilirubin 0.8 0.3 - 1.2 mg/dL   GFR calc non Af Amer >60 >60 mL/min   GFR calc Af Amer >60 >60 mL/min   Anion gap 12 5 - 15  Lactate dehydrogenase     Status: None   Collection Time: 03/06/19  1:24 AM  Result Value Ref Range   LDH 145 98 - 192 U/L  Uric acid     Status: None   Collection Time: 03/06/19  1:24 AM  Result Value Ref Range   Uric Acid, Serum 4.5 2.5 - 7.1 mg/dL  CBC     Status: Abnormal   Collection Time: 03/06/19  1:24 AM  Result Value Ref Range   WBC 13.6 (H) 4.0 - 10.5 K/uL   RBC 4.20 3.87 - 5.11 MIL/uL   Hemoglobin 12.5 12.0 - 15.0 g/dL    HCT 07.6 22.6 - 33.3 %   MCV 88.1 80.0 - 100.0 fL   MCH 29.8 26.0 - 34.0 pg   MCHC 33.8 30.0 - 36.0 g/dL   RDW 54.5 62.5 - 63.8 %   Platelets 174 150 - 400 K/uL   nRBC 0.0 0.0 - 0.2 %  Protein / creatinine ratio, urine     Status: None   Collection Time: 03/06/19  1:35 AM  Result Value Ref Range   Creatinine, Urine 109.59 mg/dL   Total Protein, Urine 12 mg/dL   Protein Creatinine Ratio 0.11 0.00 - 0.15 mg/mg[Cre]    Maternal Exam:  Abdomen: Patient reports no abdominal tenderness. Surgical scars: low transverse.   Fundal height is Size=dates.   Estimated fetal weight is EFW 7.5lbs.   Fetal presentation: vertex  Introitus: Normal vulva. Normal vagina.  Vagina is negative for discharge.  Pelvis: adequate for delivery.   Cervix: Cervix evaluated by digital exam.     Fetal Exam Fetal Monitor Review: Mode: ultrasound.   Baseline rate: 120s.  Variability: moderate (6-25 bpm).   Pattern: accelerations present and no decelerations.    Fetal State Assessment: Category I - tracings are normal.     Physical Exam  Vitals reviewed. Constitutional: She is oriented to person, place, and time. She appears well-developed and well-nourished.  HENT:  Head: Atraumatic.  Eyes: Pupils are equal, round, and reactive to light.  Cardiovascular: Normal rate, regular rhythm and normal heart sounds.  Respiratory: Effort normal and breath sounds normal. No respiratory distress.  GI: There is no abdominal tenderness.  Genitourinary:    Vulva, vagina and uterus normal.     No vaginal discharge.   Musculoskeletal: Normal range of motion.  Neurological: She is alert and oriented to person, place, and time.  Skin: Skin is warm and dry.  Psychiatric: She has a normal mood and affect. Her behavior is normal. Judgment and thought content normal.     Prenatal labs: ABO, Rh: --/--/O POS, O POS Performed at [facility], [address], [place], [place] [address]  [phone] 0110) Antibody: NEG  (05/30 0110) Rubella: Immune (01/08 0000) RPR: Nonreactive (01/08 0000)  HBsAg: Negative (  01/08 0000)  HIV: Non-reactive (01/08 0000)  GBS:   Pending, swab collected   Assessment/Plan: 33 y.o. G2P1 at 19 w Category I FHTs, reactive NST  Gestational hypertension Admit to L&D  TOLAC and pitocin induction of labor Preeclampsia labs drawn and results unremarkable  Consulted Dr. Su Hilt regarding plan of care   Hannah Acosta 03/06/2019, 3:07 AM

## 2019-03-06 ENCOUNTER — Inpatient Hospital Stay (HOSPITAL_COMMUNITY): Payer: Medicaid Other | Admitting: Anesthesiology

## 2019-03-06 ENCOUNTER — Other Ambulatory Visit: Payer: Self-pay

## 2019-03-06 ENCOUNTER — Encounter (HOSPITAL_COMMUNITY): Payer: Self-pay

## 2019-03-06 ENCOUNTER — Inpatient Hospital Stay (HOSPITAL_COMMUNITY): Payer: Medicaid Other

## 2019-03-06 ENCOUNTER — Inpatient Hospital Stay (HOSPITAL_COMMUNITY)
Admission: AD | Admit: 2019-03-06 | Discharge: 2019-03-10 | DRG: 785 | Disposition: A | Discharge status: Home / Self Care | Payer: Medicaid Other | Attending: Obstetrics & Gynecology | Admitting: Obstetrics & Gynecology

## 2019-03-06 DIAGNOSIS — O134 Gestational [pregnancy-induced] hypertension without significant proteinuria, complicating childbirth: Principal | ICD-10-CM | POA: Diagnosis present

## 2019-03-06 DIAGNOSIS — Z3A37 37 weeks gestation of pregnancy: Secondary | ICD-10-CM

## 2019-03-06 DIAGNOSIS — O900 Disruption of cesarean delivery wound: Secondary | ICD-10-CM | POA: Diagnosis present

## 2019-03-06 DIAGNOSIS — O139 Gestational [pregnancy-induced] hypertension without significant proteinuria, unspecified trimester: Secondary | ICD-10-CM | POA: Diagnosis present

## 2019-03-06 DIAGNOSIS — F32A Depression, unspecified: Secondary | ICD-10-CM | POA: Diagnosis present

## 2019-03-06 DIAGNOSIS — O133 Gestational [pregnancy-induced] hypertension without significant proteinuria, third trimester: Secondary | ICD-10-CM

## 2019-03-06 DIAGNOSIS — O9902 Anemia complicating childbirth: Secondary | ICD-10-CM | POA: Diagnosis present

## 2019-03-06 DIAGNOSIS — D649 Anemia, unspecified: Secondary | ICD-10-CM | POA: Diagnosis present

## 2019-03-06 DIAGNOSIS — O324XX0 Maternal care for high head at term, not applicable or unspecified: Secondary | ICD-10-CM | POA: Diagnosis present

## 2019-03-06 DIAGNOSIS — O34219 Maternal care for unspecified type scar from previous cesarean delivery: Secondary | ICD-10-CM | POA: Diagnosis present

## 2019-03-06 DIAGNOSIS — O99214 Obesity complicating childbirth: Secondary | ICD-10-CM | POA: Diagnosis present

## 2019-03-06 DIAGNOSIS — F329 Major depressive disorder, single episode, unspecified: Secondary | ICD-10-CM | POA: Diagnosis present

## 2019-03-06 DIAGNOSIS — F419 Anxiety disorder, unspecified: Secondary | ICD-10-CM | POA: Diagnosis present

## 2019-03-06 DIAGNOSIS — Z302 Encounter for sterilization: Secondary | ICD-10-CM | POA: Diagnosis not present

## 2019-03-06 DIAGNOSIS — O34211 Maternal care for low transverse scar from previous cesarean delivery: Secondary | ICD-10-CM | POA: Diagnosis present

## 2019-03-06 DIAGNOSIS — O99344 Other mental disorders complicating childbirth: Secondary | ICD-10-CM | POA: Diagnosis present

## 2019-03-06 HISTORY — DX: Anemia, unspecified: D64.9

## 2019-03-06 LAB — COMPREHENSIVE METABOLIC PANEL
ALT: 16 U/L (ref 0–44)
AST: 19 U/L (ref 15–41)
Albumin: 2.8 g/dL — ABNORMAL LOW (ref 3.5–5.0)
Alkaline Phosphatase: 91 U/L (ref 38–126)
Anion gap: 12 (ref 5–15)
BUN: <5 mg/dL — ABNORMAL LOW (ref 6–20)
CO2: 20 mmol/L — ABNORMAL LOW (ref 22–32)
Calcium: 9.6 mg/dL (ref 8.9–10.3)
Chloride: 103 mmol/L (ref 98–111)
Creatinine, Ser: 0.42 mg/dL — ABNORMAL LOW (ref 0.44–1.00)
GFR calc Af Amer: >60 mL/min (ref >60)
GFR calc non Af Amer: >60 mL/min (ref >60)
Glucose, Bld: 102 mg/dL — ABNORMAL HIGH (ref 70–99)
Potassium: 3.6 mmol/L (ref 3.5–5.1)
Sodium: 135 mmol/L (ref 135–145)
Total Bilirubin: 0.8 mg/dL (ref 0.3–1.2)
Total Protein: 6 g/dL — ABNORMAL LOW (ref 6.5–8.1)

## 2019-03-06 LAB — PROTEIN / CREATININE RATIO, URINE
Creatinine, Urine: 109.59 mg/dL
Protein Creatinine Ratio: 0.11 mg/mg{Cre} (ref 0.00–0.15)
Total Protein, Urine: 12 mg/dL

## 2019-03-06 LAB — TYPE AND SCREEN
ABO/RH(D): O POS
Antibody Screen: NEGATIVE

## 2019-03-06 LAB — CBC
HCT: 37 % (ref 36.0–46.0)
HCT: 36.7 % (ref 36.0–46.0)
Hemoglobin: 12.5 g/dL (ref 12.0–15.0)
Hemoglobin: 12.4 g/dL (ref 12.0–15.0)
MCH: 29.8 pg (ref 26.0–34.0)
MCH: 29.8 pg (ref 26.0–34.0)
MCHC: 33.8 g/dL (ref 30.0–36.0)
MCHC: 33.8 g/dL (ref 30.0–36.0)
MCV: 88.1 fL (ref 80.0–100.0)
MCV: 88.2 fL (ref 80.0–100.0)
Platelets: 174 10*3/uL (ref 150–400)
Platelets: 170 10*3/uL (ref 150–400)
RBC: 4.2 MIL/uL (ref 3.87–5.11)
RBC: 4.16 MIL/uL (ref 3.87–5.11)
RDW: 15.2 % (ref 11.5–15.5)
RDW: 15.2 % (ref 11.5–15.5)
WBC: 13.6 10*3/uL — ABNORMAL HIGH (ref 4.0–10.5)
WBC: 13.6 10*3/uL — ABNORMAL HIGH (ref 4.0–10.5)
nRBC: 0 % (ref 0.0–0.2)
nRBC: 0 % (ref 0.0–0.2)

## 2019-03-06 LAB — URIC ACID: Uric Acid, Serum: 4.5 mg/dL (ref 2.5–7.1)

## 2019-03-06 LAB — LACTATE DEHYDROGENASE: LDH: 145 U/L (ref 98–192)

## 2019-03-06 LAB — RPR: RPR Ser Ql: NONREACTIVE

## 2019-03-06 LAB — GROUP B STREP BY PCR: Group B strep by PCR: NEGATIVE

## 2019-03-06 MED ORDER — FLEET ENEMA 7-19 GM/118ML RE ENEM: 1.0000 | ENEMA | RECTAL | Status: DC | PRN

## 2019-03-06 MED ORDER — LABETALOL HCL 5 MG/ML IV SOLN: 40.0000 mg | INTRAVENOUS | Status: DC | PRN

## 2019-03-06 MED ORDER — LACTATED RINGERS IV SOLN: 500.0000 mL | Freq: Once | INTRAVENOUS | Status: DC

## 2019-03-06 MED ORDER — OXYTOCIN 40 UNITS IN NORMAL SALINE INFUSION - SIMPLE MED
1.0000 m[IU]/min | INTRAVENOUS | Status: DC
  Administered 2019-03-06: 03:00:00 1 m[IU]/min via INTRAVENOUS
  Filled 2019-03-06: qty 1000

## 2019-03-06 MED ORDER — LABETALOL HCL 5 MG/ML IV SOLN: 20.0000 mg | INTRAVENOUS | Status: DC | PRN

## 2019-03-06 MED ORDER — EPHEDRINE 5 MG/ML INJ: 10.0000 mg | INTRAVENOUS | Status: DC | PRN

## 2019-03-06 MED ORDER — FENTANYL CITRATE (PF) 100 MCG/2ML IJ SOLN
50.0000 ug | INTRAMUSCULAR | Status: DC | PRN
  Administered 2019-03-06: 15:00:00 50 ug via INTRAVENOUS
  Administered 2019-03-06: 13:00:00 100 ug via INTRAVENOUS
  Filled 2019-03-06 (×2): qty 2

## 2019-03-06 MED ORDER — LIDOCAINE HCL (PF) 1 % IJ SOLN: 30.0000 mL | INTRAMUSCULAR | Status: DC | PRN

## 2019-03-06 MED ORDER — PHENYLEPHRINE 40 MCG/ML (10ML) SYRINGE FOR IV PUSH (FOR BLOOD PRESSURE SUPPORT): 80.0000 ug | PREFILLED_SYRINGE | INTRAVENOUS | Status: DC | PRN

## 2019-03-06 MED ORDER — LIDOCAINE-EPINEPHRINE (PF) 2 %-1:200000 IJ SOLN
INTRAMUSCULAR | Status: DC | PRN
  Administered 2019-03-06 (×2): 3 mL via EPIDURAL

## 2019-03-06 MED ORDER — DIPHENHYDRAMINE HCL 50 MG/ML IJ SOLN
12.5000 mg | INTRAMUSCULAR | Status: AC | PRN
  Administered 2019-03-07 (×3): 12.5 mg via INTRAVENOUS
  Filled 2019-03-06 (×3): qty 1

## 2019-03-06 MED ORDER — OXYTOCIN 40 UNITS IN NORMAL SALINE INFUSION - SIMPLE MED: 2.5000 [IU]/h | INTRAVENOUS | Status: DC

## 2019-03-06 MED ORDER — SOD CITRATE-CITRIC ACID 500-334 MG/5ML PO SOLN
30.0000 mL | ORAL | Status: DC | PRN
  Administered 2019-03-07: 30 mL via ORAL
  Filled 2019-03-06: qty 30

## 2019-03-06 MED ORDER — LACTATED RINGERS IV SOLN
500.0000 mL | INTRAVENOUS | Status: DC | PRN
  Administered 2019-03-06 – 2019-03-07 (×2): 500 mL via INTRAVENOUS

## 2019-03-06 MED ORDER — HYDRALAZINE HCL 20 MG/ML IJ SOLN: 10.0000 mg | INTRAMUSCULAR | Status: DC | PRN

## 2019-03-06 MED ORDER — FENTANYL-BUPIVACAINE-NACL 0.5-0.125-0.9 MG/250ML-% EP SOLN
12.0000 mL/h | EPIDURAL | Status: DC | PRN
  Administered 2019-03-07: 09:00:00 12 mL/h via EPIDURAL
  Filled 2019-03-06 (×2): qty 250

## 2019-03-06 MED ORDER — PROMETHAZINE HCL 25 MG/ML IJ SOLN
12.5000 mg | Freq: Four times a day (QID) | INTRAMUSCULAR | Status: DC | PRN
  Administered 2019-03-06 – 2019-03-07 (×2): 12.5 mg via INTRAVENOUS
  Filled 2019-03-06 (×2): qty 1

## 2019-03-06 MED ORDER — LACTATED RINGERS IV SOLN
INTRAVENOUS | Status: DC
  Administered 2019-03-06 – 2019-03-07 (×5): via INTRAVENOUS
  Administered 2019-03-07: 12:00:00 125 mL/h via INTRAVENOUS

## 2019-03-06 MED ORDER — TERBUTALINE SULFATE 1 MG/ML IJ SOLN: 0.2500 mg | Freq: Once | INTRAMUSCULAR | Status: DC | PRN

## 2019-03-06 MED ORDER — LABETALOL HCL 5 MG/ML IV SOLN: 80.0000 mg | INTRAVENOUS | Status: DC | PRN

## 2019-03-06 MED ORDER — ACETAMINOPHEN 325 MG PO TABS: 650.0000 mg | ORAL_TABLET | ORAL | Status: DC | PRN

## 2019-03-06 MED ORDER — ONDANSETRON HCL 4 MG/2ML IJ SOLN
4.0000 mg | Freq: Four times a day (QID) | INTRAMUSCULAR | Status: DC | PRN
  Administered 2019-03-06 – 2019-03-07 (×3): 4 mg via INTRAVENOUS
  Filled 2019-03-06 (×3): qty 2

## 2019-03-06 MED ORDER — SODIUM CHLORIDE (PF) 0.9 % IJ SOLN
INTRAMUSCULAR | Status: DC | PRN
  Administered 2019-03-06: 14 mL/h via EPIDURAL

## 2019-03-06 MED ORDER — PHENYLEPHRINE 40 MCG/ML (10ML) SYRINGE FOR IV PUSH (FOR BLOOD PRESSURE SUPPORT)
80.0000 ug | PREFILLED_SYRINGE | INTRAVENOUS | Status: DC | PRN
  Filled 2019-03-06: qty 10

## 2019-03-06 MED ORDER — OXYTOCIN BOLUS FROM INFUSION: 500.0000 mL | Freq: Once | INTRAVENOUS | Status: DC

## 2019-03-06 NOTE — Progress Notes (Signed)
Labor Progress Note  Hannah Acosta, 33 y.o., G2P1001, with an IUP @ [redacted]w[redacted]d, presented for IOL for GTHN not on meds, preE labs unremarkable. , TOLAC for prior CS due to failure to descend @ 9cm 2 years ago. Pt has obesity BMI of 43, PNC remarkable for anxiety and depression tx with daily zoloft and buspar.   Subjective: Pt comfortable post epidural, RN reported foley bulb out.  Patient Active Problem List   Diagnosis Date Noted  . Gestational hypertension affecting second pregnancy 03/06/2019  . Depression 03/05/2019  . Gestational hypertension 03/05/2019  . History of cesarean delivery affecting pregnancy 03/05/2019  . Desires VBAC (vaginal birth after cesarean) trial 03/05/2019  . Acute blood loss anemia 08/19/2016  . Cesarean delivery delivered 08/18/2016  . Postpartum care following cesarean delivery (11/12) Indication: arrest of dilation 08/18/2016  . Binge eating disorder 12/12/2015  . Anxiety 05/11/2015  . Morbid obesity with BMI of 45.0-49.9, adult (HCC) 05/11/2015   Objective: BP 140/71   Pulse 91   Temp 97.6 F (36.4 C) (Axillary)   Resp 18   Ht 5\' 5"  (1.651 m)   Wt 129.5 kg   BMI 47.49 kg/m  No intake/output data recorded. Total I/O In: 1881.8 [I.V.:1881.8] Out: -  NST: FHR baseline 150 bpm, Variability: moderate, Accelerations:present, Decelerations:  Absent= Cat 1/Reactive CTX:  irregular, every 1-6 minutes, lasting 60-80 seconds Uterus gravid, soft non tender, moderate to palpate with contractions.  SVE:  Dilation: 3.5 Effacement (%): 60 Station: [employer] Exam by:: [employer], CNM Pitocin at 72mUn/min  Foley bulb out   Assessment:  Hannah Acosta, 33 y.o., G2P1001, with an IUP @ [redacted]w[redacted]d, presented for IOL for GTHN not on meds, preE labs unremarkable. , TOLAC for prior CS due to failure to descend @ 9cm 2 years ago. Pt has obesity BMI of 43, PNC remarkable for anxiety and depression tx with daily zoloft and buspar. EFW 7.1lbs via [place] on 05/20, Baby female. Pt  stable post epidural placement and comfortable, pt progressing on pitocin. Baby still very high.  Patient Active Problem List   Diagnosis Date Noted  . Gestational hypertension affecting second pregnancy 03/06/2019  . Depression 03/05/2019  . Gestational hypertension 03/05/2019  . History of cesarean delivery affecting pregnancy 03/05/2019  . Desires VBAC (vaginal birth after cesarean) trial 03/05/2019  . Acute blood loss anemia 08/19/2016  . Cesarean delivery delivered 08/18/2016  . Postpartum care following cesarean delivery (11/12) Indication: arrest of dilation 08/18/2016  . Binge eating disorder 12/12/2015  . Anxiety 05/11/2015  . Morbid obesity with BMI of 45.0-49.9, adult (HCC) 05/11/2015   NICHD: Category 1  Membranes:  Intact, no s/s of infection  Induction:    Cytotec xNone Contraindicated for TOLAC  Foley Bulb: inserted  on 5/30 @ 1000, out @ 1700  Pitocin - 6  Pain management:               IV pain management: x 1 dose of fentanyl @ 1254, x1 dose of of fentanyl @ 1446             Epidural placement: Epidural PLACED ON 5/30 @ 1600  GBS Negative  GHTN: 140/71, stable asymptomatic, cbc, cmp, LDH, uric acid and pcr unremarkable.   Plan: Continue labor plan Continuous monitoring Plan for CBC draw now with bolus then epidural placement per pt request.  Rest/Frequent position changes to facilitate fetal rotation and descent. GHTN: monitor BP and s/sx. Report BP >150/90s Will reassess with cervical exam  at 2200 or earlier if necessary Continue pitocin per protocol and titrate per adequate cxt or fetal tolerance.  Plan for AROM once fetus descend into pelvis more.  Anticipate labor progression and vaginal delivery.  Desired PP tubal consent signed for medicaid on 12/31/2018. Anxiety/Depression: Continue zoloft and buspar PP.    Md Pinn to be updated PRN.   Hannah DurhamJade Mysha Peeler, NP-C, CNM, MSN 03/06/2019. 5:49 PM

## 2019-03-06 NOTE — Anesthesia Preprocedure Evaluation (Signed)
Anesthesia Evaluation  Patient identified by MRN, date of birth, ID band Patient awake    Reviewed: Allergy & Precautions, NPO status , Patient's Chart, lab work & pertinent test results  Airway Mallampati: III  TM Distance: >3 FB Neck ROM: Full    Dental no notable dental hx.    Pulmonary neg pulmonary ROS,    Pulmonary exam normal breath sounds clear to auscultation       Cardiovascular hypertension, negative cardio ROS Normal cardiovascular exam Rhythm:Regular Rate:Normal     Neuro/Psych PSYCHIATRIC DISORDERS Anxiety Depression negative neurological ROS     GI/Hepatic negative GI ROS, Neg liver ROS,   Endo/Other  Morbid obesity  Renal/GU negative Renal ROS  negative genitourinary   Musculoskeletal negative musculoskeletal ROS (+)   Abdominal   Peds negative pediatric ROS (+)  Hematology negative hematology ROS (+)   Anesthesia Other Findings TOLAC and IOL for gHTN  Reproductive/Obstetrics (+) Pregnancy                             Anesthesia Physical Anesthesia Plan  ASA: III  Anesthesia Plan: Epidural   Post-op Pain Management:    Induction:   PONV Risk Score and Plan: Treatment may vary due to age or medical condition  Airway Management Planned: Natural Airway  Additional Equipment:   Intra-op Plan:   Post-operative Plan:   Informed Consent: I have reviewed the patients History and Physical, chart, labs and discussed the procedure including the risks, benefits and alternatives for the proposed anesthesia with the patient or authorized representative who has indicated his/her understanding and acceptance.       Plan Discussed with: Anesthesiologist  Anesthesia Plan Comments: (Patient identified. Risks, benefits, options discussed with patient including but not limited to bleeding, infection, nerve damage, paralysis, failed block, incomplete pain control, headache,  blood pressure changes, nausea, vomiting, reactions to medication, itching, and post partum back pain. Confirmed with bedside nurse the patient's most recent platelet count. Confirmed with the patient that they are not taking any anticoagulation, have any bleeding history or any family history of bleeding disorders. Patient expressed understanding and wishes to proceed. All questions were answered. )        Anesthesia Quick Evaluation

## 2019-03-06 NOTE — Progress Notes (Signed)
Labor Progress Note  Hannah Acosta, 33 y.o., G2P1001, with an IUP @ 6866w0d, presented for IOL for GTHN not on meds, preE labs unremarkable. , TOLAC for prior CS due to failure to descend @ 9cm 2 years ago. Pt has obesity BMI of 43, PNC remarkable for anxiety and depression tx with daily zoloft and buspar.   Subjective: Assumed care for pt in stable condition, pt endorses very mild feeling cxt, but tolerated them well.  Reviewed R/B/A for foley bulb placement, question answered and pt verbalized consent for placement.  Patient Active Problem List   Diagnosis Date Noted  . Gestational hypertension affecting second pregnancy 03/06/2019  . Depression 03/05/2019  . Gestational hypertension 03/05/2019  . History of cesarean delivery affecting pregnancy 03/05/2019  . Desires VBAC (vaginal birth after cesarean) trial 03/05/2019  . Acute blood loss anemia 08/19/2016  . Cesarean delivery delivered 08/18/2016  . Postpartum care following cesarean delivery (11/12) Indication: arrest of dilation 08/18/2016  . Binge eating disorder 12/12/2015  . Anxiety 05/11/2015  . Morbid obesity with BMI of 45.0-49.9, adult (HCC) 05/11/2015   Objective: BP 128/73   Pulse 94   Temp 98.1 F (36.7 C) (Oral)   Resp 18   Ht 5\' 5"  (1.651 m)   Wt 129.5 kg   BMI 47.49 kg/m  No intake/output data recorded. No intake/output data recorded. NST: FHR baseline 145 bpm, Variability: moderate, Accelerations:present, Decelerations:  Absent= Cat 1/Reactive CTX:  irregular, every 2-4 minutes, lastin g60 seconds Uterus gravid, soft non tender, mild to palpate with contractions.  SVE:  Dilation: 1 Effacement (%): 30 Station: [employer] Exam by:: Eaton CorporationJade Maylynn Orzechowski, CNM Pitocin at 816mUn/min  Foley bulb placed, cook cath with uterine balloon filled with 60mls NS, pt tolerated well.   Assessment:  Hannah HindsMary A Acosta, 33 y.o., G2P1001, with an IUP @ 2166w0d, presented for IOL for GTHN not on meds, preE labs unremarkable. , TOLAC for prior  CS due to failure to descend @ 9cm 2 years ago. Pt has obesity BMI of 43, PNC remarkable for anxiety and depression tx with daily zoloft and buspar. EFW 7.1lbs via [place] on 05/20, Baby female. Pt stable feeling mild cxt, foley bulb placed progressing on low dose pitocin.  Patient Active Problem List   Diagnosis Date Noted  . Gestational hypertension affecting second pregnancy 03/06/2019  . Depression 03/05/2019  . Gestational hypertension 03/05/2019  . History of cesarean delivery affecting pregnancy 03/05/2019  . Desires VBAC (vaginal birth after cesarean) trial 03/05/2019  . Acute blood loss anemia 08/19/2016  . Cesarean delivery delivered 08/18/2016  . Postpartum care following cesarean delivery (11/12) Indication: arrest of dilation 08/18/2016  . Binge eating disorder 12/12/2015  . Anxiety 05/11/2015  . Morbid obesity with BMI of 45.0-49.9, adult (HCC) 05/11/2015   NICHD: Category 1  Membranes:  Intact, no s/s of infection  Induction:    Cytotec xNone Contraindicated for TOLAC  Foley Bulb: inserted  on 5/30 @ 1000  Pitocin - 6  Pain management:               IV pain management: x0             Epidural placement: PRN  GBS Negative  GHTN: 137/77, stable asymptomatic, cbc, cmp, LDH, uric acid and pcr unremarkable.   Plan: Continue labor plan Continuous/intermittent monitoring Rest/Ambulate/Frequent position changes to facilitate fetal rotation and descent. GHTN: monitor BP and s/sx. Report BP >150/90s Will reassess with cervical exam at 1400 or earlier if necessary Continue pitocin  per protocol with max of 10 for cervical ripening until foley out, then advance and titrate to fetal tolerance or cxt patterns.  Anticipate labor progression and vaginal delivery.  Desired PP tubal consent signed for medicaid on 12/31/2018.   Md Pinn to be updated PRN.   Dale [place], NP-C, CNM, MSN 03/06/2019. 11:11 AM

## 2019-03-06 NOTE — Progress Notes (Addendum)
Labor Progress Note  Hannah Acosta, 33 y.o., G2P1001, with an IUP @ 365w0d, presented for IOL for GTHN not on meds, preE labs unremarkable. , TOLAC for prior CS due to failure to descend @ 9cm 2 years ago. Pt has obesity BMI of 43, PNC remarkable for anxiety and depression tx with daily zoloft and buspar.   Subjective: Pt comfortable and awake and ready and eager for the next step. Reviewed R/B/A for FSE, IUPC, AROM, pt verbalized consent for all.  Patient Active Problem List   Diagnosis Date Noted  . Gestational hypertension affecting second pregnancy 03/06/2019  . Depression 03/05/2019  . Gestational hypertension 03/05/2019  . History of cesarean delivery affecting pregnancy 03/05/2019  . Desires VBAC (vaginal birth after cesarean) trial 03/05/2019  . Acute blood loss anemia 08/19/2016  . Cesarean delivery delivered 08/18/2016  . Postpartum care following cesarean delivery (11/12) Indication: arrest of dilation 08/18/2016  . Binge eating disorder 12/12/2015  . Anxiety 05/11/2015  . Morbid obesity with BMI of 45.0-49.9, adult (HCC) 05/11/2015   Objective: BP 134/63   Pulse 98   Temp 98.8 F (37.1 C) (Axillary)   Resp 17   Ht 5\' 5"  (1.651 m)   Wt 129.5 kg   SpO2 100%   BMI 47.49 kg/m  I/O last 3 completed shifts: In: 1881.8 [I.V.:1881.8] Out: -  No intake/output data recorded. NST: FHR baseline 135 bpm, Variability: moderate, Accelerations:present, Decelerations:  Absent= Cat 1/Reactive CTX:  irregular, every 2 minutes, lasting 60-70 seconds Uterus gravid, soft non tender, moderate to palpate with contractions.   Deferred exam due to pt finally sleeping, last SVE was @ 1730 SVE:  Dilation: 4 Effacement (%): Thick Station: [employer] Exam by:: Philemon KingdomJ. Mozell Haber, CNM Pitocin at 11 mUn/min  AROM clear with [place], tolerated well, but decels were noted, unable to see cxt therefore unknown what type of decels were noted, FSE applied and IUPC inserted pt tolerated well. Cat 2 post  AROM. Strip obtained and was Cat 1, MVU were 190, therefore pitocin increased by 1 with in 30 mins post procedures.   Assessment:  Hannah Acosta, 33 y.o., G2P1001, with an IUP @ 5065w0d, presented for IOL for GTHN not on meds, preE labs unremarkable. , TOLAC for prior CS due to failure to descend @ 9cm 2 years ago. Pt has obesity BMI of 43, PNC remarkable for anxiety and depression tx with daily zoloft and buspar. EFW 7.1lbs via US on 05/20, Baby female. Pt stable sleeping well with eidural. Having regular cxt with pitocin. IUPC and FSE placed post AROM. Decel noted post AROM but unable to detect what type of decel was noted.  Patient Active Problem List   Diagnosis Date Noted  . Gestational hypertension affecting second pregnancy 03/06/2019  . Depression 03/05/2019  . Gestational hypertension 03/05/2019  . History of cesarean delivery affecting pregnancy 03/05/2019  . Desires VBAC (vaginal birth after cesarean) trial 03/05/2019  . Acute blood loss anemia 08/19/2016  . Cesarean delivery delivered 08/18/2016  . Postpartum care following cesarean delivery (11/12) Indication: arrest of dilation 08/18/2016  . Binge eating disorder 12/12/2015  . Anxiety 05/11/2015  . Morbid obesity with BMI of 45.0-49.9, adult (HCC) 05/11/2015   NICHD: Category 1  Membranes:  AROM, clear, @2230  on 5/31, no s/s of infection  Induction:    Cytotec xNone Contraindicated for TOLAC  Foley Bulb: inserted  on 5/30 @ 1000, out @ 1700  Pitocin - 11  IUPC: MVU 190  FSE in place.  Pain management:               IV pain management: x 1 dose of fentanyl @ 1254, x1 dose of of fentanyl @ 1446             Epidural placement: Epidural PLACED ON 5/30 @ 1600  GBS Negative  GHTN: 134/63, stable asymptomatic, cbc, cmp, LDH, uric acid and pcr unremarkable.   Plan: Continue labor plan Continuous monitoring Rest/Frequent position changes to facilitate fetal rotation and descent. GHTN: monitor BP and s/sx.  Report BP >150/90s Will reassess with cervical exam if necessary Continue pitocin per protocol and titrate per adequate cxt with MVU @ 200 or fetal tolerance.   Anticipate labor progression and vaginal delivery.  Desired PP tubal consent signed for medicaid on 12/31/2018. Anxiety/Depression: Continue zoloft and buspar PP.    Md Pinn to be updated PRN.   Dale [place], NP-C, CNM, MSN 03/06/2019. 11:44 PM

## 2019-03-06 NOTE — Anesthesia Procedure Notes (Signed)
Epidural Patient location during procedure: OB Start time: 03/06/2019 3:30 PM End time: 03/06/2019 3:45 PM  Staffing Anesthesiologist: Elmer Picker, MD Performed: anesthesiologist   Preanesthetic Checklist Completed: patient identified, pre-op evaluation, timeout performed, IV checked, risks and benefits discussed and monitors and equipment checked  Epidural Patient position: sitting Prep: site prepped and draped and DuraPrep Patient monitoring: continuous pulse ox, blood pressure, heart rate and cardiac monitor Approach: midline Location: L3-L4 Injection technique: LOR air  Needle:  Needle type: Tuohy  Needle gauge: 17 G Needle length: 9 cm Needle insertion depth: 7 cm Catheter type: closed end flexible Catheter size: 19 Gauge Catheter at skin depth: 13 cm Test dose: negative  Assessment Sensory level: T8 Events: blood not aspirated, injection not painful, no injection resistance, negative IV test and no paresthesia  Additional Notes Patient identified. Risks/Benefits/Options discussed with patient including but not limited to bleeding, infection, nerve damage, paralysis, failed block, incomplete pain control, headache, blood pressure changes, nausea, vomiting, reactions to medication both or allergic, itching and postpartum back pain. Confirmed with bedside nurse the patient's most recent platelet count. Confirmed with patient that they are not currently taking any anticoagulation, have any bleeding history or any family history of bleeding disorders. Patient expressed understanding and wished to proceed. All questions were answered. Sterile technique was used throughout the entire procedure. Please see nursing notes for vital signs. Test dose was given through epidural catheter and negative prior to continuing to dose epidural or start infusion. Warning signs of high block given to the patient including shortness of breath, tingling/numbness in hands, complete motor block,  or any concerning symptoms with instructions to call for help. Patient was given instructions on fall risk and not to get out of bed. All questions and concerns addressed with instructions to call with any issues or inadequate analgesia.  Reason for block:procedure for pain

## 2019-03-06 NOTE — Plan of Care (Signed)
  Problem: Education: Goal: Knowledge of Childbirth will improve Outcome: Progressing Goal: Ability to make informed decisions regarding treatment and plan of care will improve Outcome: Progressing Goal: Ability to state and carry out methods to decrease the pain will improve Outcome: Progressing Goal: Individualized Educational Video(s) Outcome: Progressing   Problem: Coping: Goal: Ability to verbalize concerns and feelings about labor and delivery will improve Outcome: Progressing   Problem: Life Cycle: Goal: Ability to make normal progression through stages of labor will improve Outcome: Progressing Goal: Ability to effectively push during vaginal delivery will improve Outcome: Progressing   Problem: Role Relationship: Goal: Will demonstrate positive interactions with the child Outcome: Progressing   Problem: Safety: Goal: Risk of complications during labor and delivery will decrease Outcome: Progressing   Problem: Pain Management: Goal: Relief or control of pain from uterine contractions will improve Outcome: Progressing   Problem: Health Behavior/Discharge Planning: Goal: Ability to manage health-related needs will improve Outcome: Progressing   Problem: Clinical Measurements: Goal: Ability to maintain clinical measurements within normal limits will improve Outcome: Progressing Goal: Will remain free from infection Outcome: Progressing Goal: Diagnostic test results will improve Outcome: Progressing Goal: Respiratory complications will improve Outcome: Progressing Goal: Cardiovascular complication will be avoided Outcome: Progressing   Problem: Activity: Goal: Risk for activity intolerance will decrease Outcome: Progressing   Problem: Nutrition: Goal: Adequate nutrition will be maintained Outcome: Progressing   Problem: Elimination: Goal: Will not experience complications related to bowel motility Outcome: Progressing Goal: Will not experience  complications related to urinary retention Outcome: Progressing   Problem: Pain Managment: Goal: General experience of comfort will improve Outcome: Progressing   Problem: Skin Integrity: Goal: Risk for impaired skin integrity will decrease Outcome: Progressing   

## 2019-03-06 NOTE — Progress Notes (Signed)
Labor Progress Note  Hannah Acosta, 33 y.o., G2P1001, with an IUP @ 2662w0d, presented for IOL for GTHN not on meds, preE labs unremarkable. , TOLAC for prior CS due to failure to descend @ 9cm 2 years ago. Pt has obesity BMI of 43, PNC remarkable for anxiety and depression tx with daily zoloft and buspar.   Subjective: Pt feeling cxt and requesting epidural placement now. Pt stable and pace breathing through cxt with grimacing. Pt husband supportive and at bedside.  Patient Active Problem List   Diagnosis Date Noted  . Gestational hypertension affecting second pregnancy 03/06/2019  . Depression 03/05/2019  . Gestational hypertension 03/05/2019  . History of cesarean delivery affecting pregnancy 03/05/2019  . Desires VBAC (vaginal birth after cesarean) trial 03/05/2019  . Acute blood loss anemia 08/19/2016  . Cesarean delivery delivered 08/18/2016  . Postpartum care following cesarean delivery (11/12) Indication: arrest of dilation 08/18/2016  . Binge eating disorder 12/12/2015  . Anxiety 05/11/2015  . Morbid obesity with BMI of 45.0-49.9, adult (HCC) 05/11/2015   Objective: BP (!) 130/58   Pulse 97   Temp 98.2 F (36.8 C) (Oral)   Resp 18   Ht 5\' 5"  (1.651 m)   Wt 129.5 kg   BMI 47.49 kg/m  No intake/output data recorded. No intake/output data recorded. NST: FHR baseline 125 bpm, Variability: moderate, Accelerations:present, Decelerations:  Absent= Cat 1/Reactive CTX:  irregular, every 2-3 minutes, lasting 60-80 seconds Uterus gravid, soft non tender, moderate to palpate with contractions.  SVE:  Dilation: 3 Effacement (%): 60 Station: [employer] Exam by:: [employer] CorporationJade Walsie Smeltz, CNM Pitocin at 226mUn/min  Foley bulb still in place.   Assessment:  Hannah Acosta, 33 y.o., G2P1001, with an IUP @ 7262w0d, presented for IOL for GTHN not on meds, preE labs unremarkable. , TOLAC for prior CS due to failure to descend @ 9cm 2 years ago. Pt has obesity BMI of 43, PNC remarkable for anxiety and  depression tx with daily zoloft and buspar. EFW 7.1lbs via US on 05/20, Baby female. Pt stable feeling mild cxt, foley bulb still in place pt progressing on low dose pitocin.  Patient Active Problem List   Diagnosis Date Noted  . Gestational hypertension affecting second pregnancy 03/06/2019  . Depression 03/05/2019  . Gestational hypertension 03/05/2019  . History of cesarean delivery affecting pregnancy 03/05/2019  . Desires VBAC (vaginal birth after cesarean) trial 03/05/2019  . Acute blood loss anemia 08/19/2016  . Cesarean delivery delivered 08/18/2016  . Postpartum care following cesarean delivery (11/12) Indication: arrest of dilation 08/18/2016  . Binge eating disorder 12/12/2015  . Anxiety 05/11/2015  . Morbid obesity with BMI of 45.0-49.9, adult (HCC) 05/11/2015   NICHD: Category 1  Membranes:  Intact, no s/s of infection  Induction:    Cytotec xNone Contraindicated for TOLAC  Foley Bulb: inserted  on 5/30 @ 1000  Pitocin - 6  Pain management:               IV pain management: x 1 dose of 100mcg fentanyl @ 1254, x1 dose of 50mcg of fentanyl @ 1446             Epidural placement: Pt requesting placement now  GBS Negative  GHTN: 130/58, stable asymptomatic, cbc, cmp, LDH, uric acid and pcr unremarkable.   Plan: Continue labor plan Continuous monitoring Plan for CBC draw now with bolus then epidural placement per pt request.  Rest/Frequent position changes to facilitate fetal rotation and descent. GHTN: monitor BP and  s/sx. Report BP >150/90s Will reassess with cervical exam at 1800 or earlier if necessary Continue pitocin per protocol with max of 10 for cervical ripening until foley out, then advance and titrate to fetal tolerance or cxt patterns.  Anticipate labor progression and vaginal delivery.  Desired PP tubal consent signed for medicaid on 12/31/2018. Anxiety/Depression: Continue zoloft and buspar PP.    Md Pinn updated.   Dale [place], NP-C, CNM,  MSN 03/06/2019. 3:51 PM

## 2019-03-06 NOTE — Progress Notes (Signed)
Hannah Acosta is a 33 y.o. G2P1001 at [redacted]w[redacted]d admitted for induction of labor due to Hypertension, and TOLAC.   Subjective: Patient having regular but nonpalpable contractions.   Objective: Vitals:   03/06/19 0401 03/06/19 0505 03/06/19 0609 03/06/19 0631  BP: (!) 146/71 (!) 154/79 136/67 134/64  Pulse: 97 94 95 99  Resp: 18 18 17 17   Temp:  97.8 F (36.6 C)    TempSrc:  Oral    Weight:      Height:        FHT:  FHR: 120s bpm, variability: moderate,  accelerations:  Present,  decelerations:  Absent UC:   irregular, every 3-5 minutes SVE:   Dilation: Fingertip Effacement (%): 30 Exam by:: Ethelene Browns, CNM  Labs: Lab Results  Component Value Date   WBC 13.6 (H) 03/06/2019   HGB 12.5 03/06/2019   HCT 37.0 03/06/2019   MCV 88.1 03/06/2019   PLT 174 03/06/2019    Assessment / Plan: Induction of labor due to gestational hypertension,  progressing well on pitocin  Labor: Progressing normally Preeclampsia:  no signs or symptoms of toxicity, intake and ouput balanced and labs stable Fetal Wellbeing:  Category I Pain Control:  Labor support without medications I/D:  n/a Anticipated MOD:  NSVD  Hannah Acosta 03/06/2019, 7:00 AM

## 2019-03-06 NOTE — Progress Notes (Signed)
Labor Progress Note  Hannah Acosta, 33 y.o., G2P1001, with an IUP @ [redacted]w[redacted]d, presented for IOL for GTHN not on meds, preE labs unremarkable. , TOLAC for prior CS due to failure to descend @ 9cm 2 years ago. Pt has obesity BMI of 43, PNC remarkable for anxiety and depression tx with daily zoloft and buspar.   Subjective: Pt comfortable and sleeping.  Patient Active Problem List   Diagnosis Date Noted  . Gestational hypertension affecting second pregnancy 03/06/2019  . Depression 03/05/2019  . Gestational hypertension 03/05/2019  . History of cesarean delivery affecting pregnancy 03/05/2019  . Desires VBAC (vaginal birth after cesarean) trial 03/05/2019  . Acute blood loss anemia 08/19/2016  . Cesarean delivery delivered 08/18/2016  . Postpartum care following cesarean delivery (11/12) Indication: arrest of dilation 08/18/2016  . Binge eating disorder 12/12/2015  . Anxiety 05/11/2015  . Morbid obesity with BMI of 45.0-49.9, adult (HCC) 05/11/2015   Objective: BP (!) 141/82   Pulse 98   Temp 97.6 F (36.4 C) (Axillary)   Resp 17   Ht 5\' 5"  (1.651 m)   Wt 129.5 kg   BMI 47.49 kg/m  I/O last 3 completed shifts: In: 1881.8 [I.V.:1881.8] Out: -  No intake/output data recorded. NST: FHR baseline 135 bpm, Variability: moderate, Accelerations:present, Decelerations:  Absent= Cat 1/Reactive CTX:  irregular, every 2 minutes, lasting 60-70 seconds Uterus gravid, soft non tender, moderate to palpate with contractions.   Deferred exam due to pt finally sleeping, last SVE was @ 1730 SVE:  Dilation: 3.5 Effacement (%): 60 Station: [employer] Exam by:: [employer], CNM Pitocin at 10 mUn/min   Assessment:  Hannah Acosta, 33 y.o., G2P1001, with an IUP @ [redacted]w[redacted]d, presented for IOL for GTHN not on meds, preE labs unremarkable. , TOLAC for prior CS due to failure to descend @ 9cm 2 years ago. Pt has obesity BMI of 43, PNC remarkable for anxiety and depression tx with daily zoloft and buspar. EFW  7.1lbs via [place] on 05/20, Baby female. Pt stable sleeping well with eidural. Having regular cxt with pitocin.  Patient Active Problem List   Diagnosis Date Noted  . Gestational hypertension affecting second pregnancy 03/06/2019  . Depression 03/05/2019  . Gestational hypertension 03/05/2019  . History of cesarean delivery affecting pregnancy 03/05/2019  . Desires VBAC (vaginal birth after cesarean) trial 03/05/2019  . Acute blood loss anemia 08/19/2016  . Cesarean delivery delivered 08/18/2016  . Postpartum care following cesarean delivery (11/12) Indication: arrest of dilation 08/18/2016  . Binge eating disorder 12/12/2015  . Anxiety 05/11/2015  . Morbid obesity with BMI of 45.0-49.9, adult (HCC) 05/11/2015   NICHD: Category 1  Membranes:  Intact, no s/s of infection  Induction:    Cytotec xNone Contraindicated for TOLAC  Foley Bulb: inserted  on 5/30 @ 1000, out @ 1700  Pitocin - 10  Pain management:               IV pain management: x 1 dose of fentanyl @ 1254, x1 dose of of fentanyl @ 1446             Epidural placement: Epidural PLACED ON 5/30 @ 1600  GBS Negative  GHTN: 141/82, stable asymptomatic, cbc, cmp, LDH, uric acid and pcr unremarkable.   Plan: Continue labor plan Continuous monitoring Rest/Frequent position changes to facilitate fetal rotation and descent. GHTN: monitor BP and s/sx. Report BP >150/90s Will reassess with cervical exam if necessary Continue pitocin per protocol and titrate per  adequate cxt or fetal tolerance.  Plan for AROM once fetus descend into pelvis more.  Anticipate labor progression and vaginal delivery.  Desired PP tubal consent signed for medicaid on 12/31/2018. Anxiety/Depression: Continue zoloft and buspar PP.    Md Pinn to be updated PRN.   Hannah DurhamJade Vala Raffo, NP-C, CNM, MSN 03/06/2019. 8:58 PM

## 2019-03-07 ENCOUNTER — Encounter (HOSPITAL_COMMUNITY): Payer: Self-pay | Admitting: Anesthesiology

## 2019-03-07 ENCOUNTER — Other Ambulatory Visit: Payer: Self-pay

## 2019-03-07 ENCOUNTER — Encounter (HOSPITAL_COMMUNITY)
Admission: AD | Disposition: A | Discharge status: Home / Self Care | Payer: Self-pay | Source: Home / Self Care | Attending: Obstetrics and Gynecology

## 2019-03-07 LAB — CBC
HCT: 35 % — ABNORMAL LOW (ref 36.0–46.0)
Hemoglobin: 11.7 g/dL — ABNORMAL LOW (ref 12.0–15.0)
MCH: 30.2 pg (ref 26.0–34.0)
MCHC: 33.4 g/dL (ref 30.0–36.0)
MCV: 90.2 fL (ref 80.0–100.0)
Platelets: 159 10*3/uL (ref 150–400)
RBC: 3.88 MIL/uL (ref 3.87–5.11)
RDW: 15.2 % (ref 11.5–15.5)
WBC: 20.5 10*3/uL — ABNORMAL HIGH (ref 4.0–10.5)
nRBC: 0 % (ref 0.0–0.2)

## 2019-03-07 LAB — PROTEIN / CREATININE RATIO, URINE
Creatinine, Urine: 559.9 mg/dL
Protein Creatinine Ratio: 0.13 mg/mg{Cre} (ref 0.00–0.15)
Total Protein, Urine: 70 mg/dL

## 2019-03-07 LAB — LACTATE DEHYDROGENASE: LDH: 192 U/L (ref 98–192)

## 2019-03-07 LAB — URIC ACID: Uric Acid, Serum: 5.8 mg/dL (ref 2.5–7.1)

## 2019-03-07 LAB — ABO/RH: ABO/RH(D): O POS

## 2019-03-07 SURGERY — Surgical Case
Anesthesia: Epidural | Site: Abdomen | Wound class: Clean Contaminated

## 2019-03-07 MED ORDER — SODIUM CHLORIDE 0.9% FLUSH: 3.0000 mL | INTRAVENOUS | Status: DC | PRN

## 2019-03-07 MED ORDER — SENNOSIDES-DOCUSATE SODIUM 8.6-50 MG PO TABS
2.0000 | ORAL_TABLET | ORAL | Status: DC
  Administered 2019-03-09 (×2): 2 via ORAL
  Filled 2019-03-07 (×2): qty 2

## 2019-03-07 MED ORDER — KETOROLAC TROMETHAMINE 30 MG/ML IJ SOLN: 30.0000 mg | Freq: Four times a day (QID) | INTRAMUSCULAR | Status: DC | PRN

## 2019-03-07 MED ORDER — OXYTOCIN 40 UNITS IN NORMAL SALINE INFUSION - SIMPLE MED
INTRAVENOUS | Status: AC
  Filled 2019-03-07: qty 1000

## 2019-03-07 MED ORDER — DIBUCAINE (PERIANAL) 1 % EX OINT: 1.0000 "application " | TOPICAL_OINTMENT | CUTANEOUS | Status: DC | PRN

## 2019-03-07 MED ORDER — MEPERIDINE HCL 25 MG/ML IJ SOLN
INTRAMUSCULAR | Status: AC
  Filled 2019-03-07: qty 1

## 2019-03-07 MED ORDER — SCOPOLAMINE 1 MG/3DAYS TD PT72
MEDICATED_PATCH | TRANSDERMAL | Status: AC
  Filled 2019-03-07: qty 1

## 2019-03-07 MED ORDER — COCONUT OIL OIL: 1.0000 "application " | TOPICAL_OIL | Status: DC | PRN

## 2019-03-07 MED ORDER — KETOROLAC TROMETHAMINE 30 MG/ML IJ SOLN
30.0000 mg | Freq: Four times a day (QID) | INTRAMUSCULAR | Status: AC
  Administered 2019-03-07 – 2019-03-08 (×4): 30 mg via INTRAVENOUS
  Filled 2019-03-07 (×4): qty 1

## 2019-03-07 MED ORDER — MORPHINE SULFATE (PF) 0.5 MG/ML IJ SOLN
INTRAMUSCULAR | Status: DC | PRN
  Administered 2019-03-07: 3 mg via EPIDURAL

## 2019-03-07 MED ORDER — TETANUS-DIPHTH-ACELL PERTUSSIS 5-2.5-18.5 LF-MCG/0.5 IM SUSP: 0.5000 mL | Freq: Once | INTRAMUSCULAR | Status: DC

## 2019-03-07 MED ORDER — LACTATED RINGERS IV SOLN
INTRAVENOUS | Status: DC
  Administered 2019-03-07 – 2019-03-08 (×2): via INTRAVENOUS

## 2019-03-07 MED ORDER — NALBUPHINE HCL 10 MG/ML IJ SOLN: 5.0000 mg | Freq: Once | INTRAMUSCULAR | Status: DC | PRN

## 2019-03-07 MED ORDER — SIMETHICONE 80 MG PO CHEW
80.0000 mg | CHEWABLE_TABLET | Freq: Three times a day (TID) | ORAL | Status: DC
  Administered 2019-03-08 – 2019-03-10 (×6): 80 mg via ORAL
  Filled 2019-03-07 (×7): qty 1

## 2019-03-07 MED ORDER — LACTATED RINGERS IV SOLN
INTRAVENOUS | Status: DC | PRN
  Administered 2019-03-07: 17:00:00 via INTRAVENOUS

## 2019-03-07 MED ORDER — MEPERIDINE HCL 25 MG/ML IJ SOLN
INTRAMUSCULAR | Status: DC | PRN
  Administered 2019-03-07 (×2): 12.5 mg via INTRAVENOUS

## 2019-03-07 MED ORDER — DEXTROSE 5 % IV SOLN
INTRAVENOUS | Status: AC
  Filled 2019-03-07: qty 3000

## 2019-03-07 MED ORDER — WITCH HAZEL-GLYCERIN EX PADS: 1.0000 "application " | MEDICATED_PAD | CUTANEOUS | Status: DC | PRN

## 2019-03-07 MED ORDER — SCOPOLAMINE 1 MG/3DAYS TD PT72
MEDICATED_PATCH | TRANSDERMAL | Status: DC | PRN
  Administered 2019-03-07: 1 via TRANSDERMAL

## 2019-03-07 MED ORDER — OXYTOCIN 40 UNITS IN NORMAL SALINE INFUSION - SIMPLE MED: 2.5000 [IU]/h | INTRAVENOUS | Status: AC

## 2019-03-07 MED ORDER — FENTANYL CITRATE (PF) 100 MCG/2ML IJ SOLN
INTRAMUSCULAR | Status: AC
  Filled 2019-03-07: qty 2

## 2019-03-07 MED ORDER — MORPHINE SULFATE (PF) 0.5 MG/ML IJ SOLN
INTRAMUSCULAR | Status: AC
  Filled 2019-03-07: qty 10

## 2019-03-07 MED ORDER — LIDOCAINE-EPINEPHRINE (PF) 2 %-1:200000 IJ SOLN
INTRAMUSCULAR | Status: AC
  Filled 2019-03-07: qty 10

## 2019-03-07 MED ORDER — SODIUM CHLORIDE 0.9 % IV SOLN
INTRAVENOUS | Status: DC | PRN
  Administered 2019-03-07: 18:00:00 40 [IU] via INTRAVENOUS

## 2019-03-07 MED ORDER — ONDANSETRON HCL 4 MG/2ML IJ SOLN: 4.0000 mg | Freq: Four times a day (QID) | INTRAMUSCULAR | Status: DC | PRN

## 2019-03-07 MED ORDER — SCOPOLAMINE 1 MG/3DAYS TD PT72: 1.0000 | MEDICATED_PATCH | Freq: Once | TRANSDERMAL | Status: DC

## 2019-03-07 MED ORDER — DIPHENHYDRAMINE HCL 25 MG PO CAPS: 25.0000 mg | ORAL_CAPSULE | Freq: Four times a day (QID) | ORAL | Status: DC | PRN

## 2019-03-07 MED ORDER — SERTRALINE HCL 50 MG PO TABS
50.0000 mg | ORAL_TABLET | Freq: Every day | ORAL | Status: DC
  Administered 2019-03-08 – 2019-03-10 (×3): 50 mg via ORAL
  Filled 2019-03-07 (×3): qty 1

## 2019-03-07 MED ORDER — NALOXONE HCL 4 MG/10ML IJ SOLN
1.0000 ug/kg/h | INTRAVENOUS | Status: DC | PRN
  Filled 2019-03-07: qty 5

## 2019-03-07 MED ORDER — ZOLPIDEM TARTRATE 5 MG PO TABS: 5.0000 mg | ORAL_TABLET | Freq: Every evening | ORAL | Status: DC | PRN

## 2019-03-07 MED ORDER — DEXTROSE 5 % IV SOLN
INTRAVENOUS | Status: DC | PRN
  Administered 2019-03-07: 17:00:00 3 g via INTRAVENOUS

## 2019-03-07 MED ORDER — IBUPROFEN 800 MG PO TABS
800.0000 mg | ORAL_TABLET | Freq: Three times a day (TID) | ORAL | Status: DC | PRN
  Administered 2019-03-09 – 2019-03-10 (×5): 800 mg via ORAL
  Filled 2019-03-07 (×5): qty 1

## 2019-03-07 MED ORDER — OXYCODONE HCL 5 MG/5ML PO SOLN: 5.0000 mg | Freq: Once | ORAL | Status: DC | PRN

## 2019-03-07 MED ORDER — OXYCODONE HCL 5 MG PO TABS: 5.0000 mg | ORAL_TABLET | Freq: Once | ORAL | Status: DC | PRN

## 2019-03-07 MED ORDER — FENTANYL CITRATE (PF) 100 MCG/2ML IJ SOLN: 25.0000 ug | INTRAMUSCULAR | Status: DC | PRN

## 2019-03-07 MED ORDER — MENTHOL 3 MG MT LOZG: 1.0000 | LOZENGE | OROMUCOSAL | Status: DC | PRN

## 2019-03-07 MED ORDER — PRENATAL MULTIVITAMIN CH
1.0000 | ORAL_TABLET | Freq: Every day | ORAL | Status: DC
  Administered 2019-03-08 – 2019-03-10 (×3): 1 via ORAL
  Filled 2019-03-07 (×3): qty 1

## 2019-03-07 MED ORDER — DIPHENHYDRAMINE HCL 50 MG/ML IJ SOLN
12.5000 mg | INTRAMUSCULAR | Status: DC | PRN
  Administered 2019-03-08 (×2): 12.5 mg via INTRAVENOUS
  Filled 2019-03-07 (×2): qty 1

## 2019-03-07 MED ORDER — OXYCODONE HCL 5 MG PO TABS
5.0000 mg | ORAL_TABLET | Freq: Four times a day (QID) | ORAL | Status: DC | PRN
  Administered 2019-03-08 – 2019-03-10 (×7): 5 mg via ORAL
  Filled 2019-03-07 (×7): qty 1

## 2019-03-07 MED ORDER — FENTANYL CITRATE (PF) 100 MCG/2ML IJ SOLN
INTRAMUSCULAR | Status: DC | PRN
  Administered 2019-03-07: 50 ug via INTRAVENOUS

## 2019-03-07 MED ORDER — NALBUPHINE HCL 10 MG/ML IJ SOLN: 5.0000 mg | INTRAMUSCULAR | Status: DC | PRN

## 2019-03-07 MED ORDER — DIPHENHYDRAMINE HCL 25 MG PO CAPS: 25.0000 mg | ORAL_CAPSULE | ORAL | Status: DC | PRN

## 2019-03-07 MED ORDER — ENOXAPARIN SODIUM 40 MG/0.4ML ~~LOC~~ SOLN: 40.0000 mg | SUBCUTANEOUS | Status: DC

## 2019-03-07 MED ORDER — SIMETHICONE 80 MG PO CHEW
80.0000 mg | CHEWABLE_TABLET | ORAL | Status: DC | PRN
  Administered 2019-03-09 (×2): 80 mg via ORAL

## 2019-03-07 MED ORDER — ONDANSETRON HCL 4 MG/2ML IJ SOLN: 4.0000 mg | Freq: Three times a day (TID) | INTRAMUSCULAR | Status: DC | PRN

## 2019-03-07 MED ORDER — POTASSIUM CHLORIDE IN NACL 20-0.9 MEQ/L-% IV SOLN
INTRAVENOUS | Status: DC | PRN
  Administered 2019-03-07: 18:00:00 via INTRAVENOUS

## 2019-03-07 MED ORDER — DEXAMETHASONE SODIUM PHOSPHATE 4 MG/ML IJ SOLN
INTRAMUSCULAR | Status: AC
  Filled 2019-03-07: qty 1

## 2019-03-07 MED ORDER — SIMETHICONE 80 MG PO CHEW
80.0000 mg | CHEWABLE_TABLET | ORAL | Status: DC
  Administered 2019-03-09: 80 mg via ORAL
  Filled 2019-03-07 (×2): qty 1

## 2019-03-07 MED ORDER — ACETAMINOPHEN 325 MG PO TABS
650.0000 mg | ORAL_TABLET | ORAL | Status: DC | PRN
  Administered 2019-03-09 – 2019-03-10 (×3): 650 mg via ORAL
  Filled 2019-03-07 (×2): qty 2

## 2019-03-07 MED ORDER — KETOROLAC TROMETHAMINE 30 MG/ML IJ SOLN
INTRAMUSCULAR | Status: AC
  Filled 2019-03-07: qty 1

## 2019-03-07 MED ORDER — NALOXONE HCL 0.4 MG/ML IJ SOLN: 0.4000 mg | INTRAMUSCULAR | Status: DC | PRN

## 2019-03-07 MED ORDER — ENOXAPARIN SODIUM 60 MG/0.6ML ~~LOC~~ SOLN
60.0000 mg | SUBCUTANEOUS | Status: DC
  Administered 2019-03-09 – 2019-03-10 (×2): 60 mg via SUBCUTANEOUS
  Filled 2019-03-07 (×3): qty 0.6

## 2019-03-07 MED ORDER — KETOROLAC TROMETHAMINE 30 MG/ML IJ SOLN
30.0000 mg | Freq: Four times a day (QID) | INTRAMUSCULAR | Status: DC | PRN
  Administered 2019-03-07: 20:00:00 30 mg via INTRAVENOUS

## 2019-03-07 MED ORDER — DEXAMETHASONE SODIUM PHOSPHATE 4 MG/ML IJ SOLN
INTRAMUSCULAR | Status: DC | PRN
  Administered 2019-03-07: 4 mg via INTRAVENOUS

## 2019-03-07 MED ORDER — SODIUM BICARBONATE 8.4 % IV SOLN
INTRAVENOUS | Status: DC | PRN
  Administered 2019-03-07: 5 mL via EPIDURAL
  Administered 2019-03-07 (×3): 3 mL via EPIDURAL
  Administered 2019-03-07: 2 mL via EPIDURAL

## 2019-03-07 MED ORDER — MEPERIDINE HCL 25 MG/ML IJ SOLN: 6.2500 mg | INTRAMUSCULAR | Status: DC | PRN

## 2019-03-07 SURGICAL SUPPLY — 36 items
BENZOIN TINCTURE PRP APPL 2/3 (GAUZE/BANDAGES/DRESSINGS) ×2 IMPLANT
CHLORAPREP W/TINT 26ML (MISCELLANEOUS) ×2 IMPLANT
CLAMP CORD UMBIL (MISCELLANEOUS) IMPLANT
CLOSURE STERI STRIP 1/2 X4 (GAUZE/BANDAGES/DRESSINGS) ×2 IMPLANT
CLOTH BEACON ORANGE TIMEOUT ST (SAFETY) ×2 IMPLANT
DRSG OPSITE POSTOP 4X10 (GAUZE/BANDAGES/DRESSINGS) ×2 IMPLANT
ELECT REM PT RETURN 9FT ADLT (ELECTROSURGICAL) ×2
ELECTRODE REM PT RTRN 9FT ADLT (ELECTROSURGICAL) ×1 IMPLANT
EXTRACTOR VACUUM M CUP 4 TUBE (SUCTIONS) ×2 IMPLANT
GLOVE BIO SURGEON STRL SZ7.5 (GLOVE) ×2 IMPLANT
GLOVE BIOGEL PI IND STRL 7.0 (GLOVE) ×1 IMPLANT
GLOVE BIOGEL PI IND STRL 7.5 (GLOVE) ×1 IMPLANT
GLOVE BIOGEL PI INDICATOR 7.0 (GLOVE) ×1
GLOVE BIOGEL PI INDICATOR 7.5 (GLOVE) ×1
GOWN STRL REUS W/TWL LRG LVL3 (GOWN DISPOSABLE) ×4 IMPLANT
HEMOSTAT ARISTA ABSORB 3G PWDR (HEMOSTASIS) ×2 IMPLANT
HOVERMATT SINGLE USE (MISCELLANEOUS) ×2 IMPLANT
KIT ABG SYR 3ML LUER SLIP (SYRINGE) IMPLANT
NEEDLE HYPO 25X5/8 SAFETYGLIDE (NEEDLE) IMPLANT
NS IRRIG 1000ML POUR BTL (IV SOLUTION) ×2 IMPLANT
PACK C SECTION WH (CUSTOM PROCEDURE TRAY) ×2 IMPLANT
PAD OB MATERNITY 4.3X12.25 (PERSONAL CARE ITEMS) ×2 IMPLANT
PENCIL SMOKE EVAC W/HOLSTER (ELECTROSURGICAL) ×2 IMPLANT
RTRCTR C-SECT PINK 25CM LRG (MISCELLANEOUS) ×2 IMPLANT
STRIP CLOSURE SKIN 1/2X4 (GAUZE/BANDAGES/DRESSINGS) ×2 IMPLANT
SUT CHROMIC 2 0 CT 1 (SUTURE) ×2 IMPLANT
SUT MNCRL AB 3-0 PS2 27 (SUTURE) ×2 IMPLANT
SUT PLAIN 2 0 XLH (SUTURE) ×2 IMPLANT
SUT VIC AB 0 CT1 36 (SUTURE) ×2 IMPLANT
SUT VIC AB 0 CTX 36 (SUTURE) ×4
SUT VIC AB 0 CTX36XBRD ANBCTRL (SUTURE) ×4 IMPLANT
SUT VIC AB 2-0 SH 27 (SUTURE) ×4
SUT VIC AB 2-0 SH 27XBRD (SUTURE) ×4 IMPLANT
TOWEL OR 17X24 6PK STRL BLUE (TOWEL DISPOSABLE) ×2 IMPLANT
TRAY FOLEY W/BAG SLVR 14FR LF (SET/KITS/TRAYS/PACK) ×2 IMPLANT
WATER STERILE IRR 1000ML POUR (IV SOLUTION) ×2 IMPLANT

## 2019-03-07 NOTE — Progress Notes (Signed)
Subjective: Pt comfortable with epidural.  Resting in bed.  Denies nausea.  Objective: BP (!) 150/75   Pulse 96   Temp 98.1 F (36.7 C) (Oral)   Resp 16   Ht 5\' 5"  (1.651 m)   Wt 129.5 kg   SpO2 97%   BMI 47.49 kg/m  I/O last 3 completed shifts: In: 1881.8 [I.V.:1881.8] Out: 1250 [Urine:1250] Total I/O In: -  Out: 250 [Urine:250]  FHT: Category 1 FHTs BL125 accels no decels noted UC:   regular, every 2-3 minutes SVE:   Dilation: 5 Effacement (%): 70 Station: -3 Exam by:: Bernerd Pho, CNM Pitocin at 20 mu MVUs 185 to 190s  Assessment:  G2P1001 at 37.1 weeks induction for GHTN Hx of LTCS  Cat 1 strip GBS negative Desires permanent sterilization Hx of depression and anxiety  Plan: Monitor progress Discussed fetal head position and need for descent Monitor progress with MVUs adequate  Kenney Houseman CNM, MSN 03/07/2019, 9:18 AM

## 2019-03-07 NOTE — Progress Notes (Signed)
Labor Progress Note  Hannah Acosta, 33 y.o., G2P1001, with an IUP @ 736w0d, presented for IOL for GTHN not on meds, preE labs unremarkable. , TOLAC for prior CS due to failure to descend @ 9cm 2 years ago. Pt has obesity BMI of 43, PNC remarkable for anxiety and depression tx with daily zoloft and buspar.   Subjective: Pt sleeping well in NAD Patient Active Problem List   Diagnosis Date Noted  . Gestational hypertension affecting second pregnancy 03/06/2019  . Depression 03/05/2019  . Gestational hypertension 03/05/2019  . History of cesarean delivery affecting pregnancy 03/05/2019  . Desires VBAC (vaginal birth after cesarean) trial 03/05/2019  . Acute blood loss anemia 08/19/2016  . Cesarean delivery delivered 08/18/2016  . Postpartum care following cesarean delivery (11/12) Indication: arrest of dilation 08/18/2016  . Binge eating disorder 12/12/2015  . Anxiety 05/11/2015  . Morbid obesity with BMI of 45.0-49.9, adult (HCC) 05/11/2015   Objective: BP (!) 111/58   Pulse 94   Temp 98.8 F (37.1 C) (Axillary)   Resp (P) 16   Ht 5\' 5"  (1.651 m)   Wt 129.5 kg   SpO2 97%   BMI 47.49 kg/m  I/O last 3 completed shifts: In: 1881.8 [I.V.:1881.8] Out: -  No intake/output data recorded. NST: FHR baseline 125 bpm, Variability: moderate, Accelerations:present, Decelerations:  Absent= Cat 1/Reactive CTX:  irregular, every 2-3 minutes, lasting 60-70 seconds Uterus gravid, soft non tender, moderate to palpate with contractions.   Deferred exam due to pt finally sleeping, last SVE was @ 1730 SVE:  Dilation: 5 Effacement (%): 70 Station: -3 Exam by:: Adelina MingsJ. Davis, RN Pitocin at 20 mUn/min  AROM clear with [place], tolerated well, but decels were noted, unable to see cxt therefore unknown what type of decels were noted, FSE applied and IUPC inserted pt tolerated well. Cat 2 post AROM. Strip obtained and was Cat 1, MVU were 190, therefore pitocin increased by 1 with in 30 mins post  procedures.   Assessment:  Hannah Acosta, 32 y.o., G2P1001, with an IUP @ 526w0d, presented for IOL for GTHN not on meds, preE labs unremarkable. , TOLAC for prior CS due to failure to descend @ 9cm 2 years ago. Pt has obesity BMI of 43, PNC remarkable for anxiety and depression tx with daily zoloft and buspar. EFW 7.1lbs via [place] on 05/20, Baby female. Pt stable sleeping well with eidural. Having regular cxt and sleeping Patient Active Problem List   Diagnosis Date Noted  . Gestational hypertension affecting second pregnancy 03/06/2019  . Depression 03/05/2019  . Gestational hypertension 03/05/2019  . History of cesarean delivery affecting pregnancy 03/05/2019  . Desires VBAC (vaginal birth after cesarean) trial 03/05/2019  . Acute blood loss anemia 08/19/2016  . Cesarean delivery delivered 08/18/2016  . Postpartum care following cesarean delivery (11/12) Indication: arrest of dilation 08/18/2016  . Binge eating disorder 12/12/2015  . Anxiety 05/11/2015  . Morbid obesity with BMI of 45.0-49.9, adult (HCC) 05/11/2015   NICHD: Category 1  Membranes:  AROM, clear, @2230  on 5/31, no s/s of infection  Induction:    Cytotec xNone Contraindicated for TOLAC  Foley Bulb: inserted  on 5/30 @ 1000, out @ 1700  Pitocin - 20  IUPC: MVU 150  FSE in place.   Pain management:               IV pain management: x 1 dose of 100mcg fentanyl @ 1254, x1 dose of 50mcg of fentanyl @ 1446  Epidural placement: Epidural PLACED ON 5/30 @ 1600  GBS Negative  GHTN: 111/58, stable asymptomatic, cbc, cmp, LDH, uric acid and pcr unremarkable.   Plan: Continue labor plan Continuous monitoring Rest/Frequent position changes to facilitate fetal rotation and descent. GHTN: monitor BP and s/sx. Report BP >150/90s Will reassess with cervical exam if necessary Continue pitocin per protocol and titrate per adequate cxt with MVU @ 200 or fetal tolerance.   Anticipate labor progression and vaginal  delivery.  Desired PP tubal consent signed for medicaid on 12/31/2018. Anxiety/Depression: Continue zoloft and buspar PP.    Md Pinn to be updated PRN.  Report to be given @ 0700 to [facility] and Dr Su Hilt whom will assumed care of pt @ 0700.  Dale [place], NP-C, CNM, MSN 03/07/2019. 6:36 AM

## 2019-03-07 NOTE — Transfer of Care (Signed)
Immediate Anesthesia Transfer of Care Note  Patient: Hannah Acosta  Procedure(s) Performed: CESAREAN SECTION (N/A Abdomen)  Patient Location: PACU  Anesthesia Type:Epidural  Level of Consciousness: awake, alert  and patient cooperative  Airway & Oxygen Therapy: Patient Spontanous Breathing  Post-op Assessment: Report given to RN and Post -op Vital signs reviewed and stable  Post vital signs: Reviewed and stable  Last Vitals:  Vitals Value Taken Time  BP 146/75 03/07/2019  7:21 PM  Temp    Pulse 95 03/07/2019  7:23 PM  Resp 14 03/07/2019  7:23 PM  SpO2 99 % 03/07/2019  7:23 PM  Vitals shown include unvalidated device data.  Last Pain:  Vitals:   03/07/19 1644  TempSrc: Oral  PainSc:          Complications: No apparent anesthesia complications

## 2019-03-07 NOTE — Progress Notes (Addendum)
Subjective: Pt wishes to be checked.  Feeling pressure in pelvis  Objective: BP 133/88   Pulse 93   Temp 98.4 F (36.9 C) (Oral)   Resp 16   Ht 5\' 5"  (1.651 m)   Wt 129.5 kg   SpO2 97%   BMI 47.49 kg/m  I/O last 3 completed shifts: In: 1881.8 [I.V.:1881.8] Out: 1250 [Urine:1250] Total I/O In: 2402.9 [I.V.:2402.9] Out: 550 [Urine:550]  FHT: Category FHT 135 accels occ decel  UC:   regular, every 2-3 minutes SVE:   Dilation: 10 Effacement (%): 100 Station: -2 Exam by:: Bernerd Pho, CNM Pitocin at 12 mu MVUs 120 Trial pushing done  [facility] to 190s with pushing, late decel after to 120.  Slow return to baseline. Unable to feel fetal head descend.  Fetal head at -2 station.   Assessment:  G2P1001 at 37.1 weeks induction for GHTN Hx of LTCS  Cat2strip GBS negative Desires permanent sterilization  Plan: Discussed fetal status and station with patient with fob.  Discussed fetal intolerance with labor with pushing.  Fetal head high.  Offered pt to labor down to see if descent or repeat LTCS.  Pt and husband decided on repeat LTCS.  Discussed risk and benefits.   Dr. Su Hilt called with POC Called RN and had her turn off pitocin.  Kenney Houseman CNM, MSN 03/07/2019, 4:28 PM  The patient refused c/s earlier.  Pitocin was held and pitocin restarted with progression to fully dilated.  Called by Bernerd Pho, CNM and pt has agreed to proceed with c/s.  Upon my arrival in the room pt was exhausted and said she doesn't have the energy to push for 4hours and baby is not tolerating pushing well.  R/B/A reviewed with the patient including but not limited to bleeding infection injury.  Questions answered.  Pt and husband verbalized understanding and consent s/w.

## 2019-03-07 NOTE — Lactation Note (Signed)
This note was copied from a baby's chart. Lactation Consultation Note  Patient Name: Girl Jamaira Sherk DKEUV'H Date: 03/07/2019   P2, Baby 4 hours old.  [redacted]w[redacted]d  9 lb.  Upon entering FOB stated that they had given the baby colostrum, she breastfed for 5 min and she was going to sleep.  Baby was cueing. Mother stated that she was told she had flat nipples and with latching her 1st child was losing weight and had to be supplemented w/ formula.  She pumped for 1 year and does not want to repeat that. Reviewed hand expression with good flow of colostrum.  Mother's nipples evert.  Assisted with latching baby in football hold with off and on sucking for approx 10 min. Mother anxious stating she was worried baby was uncomfortable and maybe her belly button was hurting in football position and wanted to stop and let baby sleep.  Reassured mother. Family wanting to rest.  Suggest attempting to hand express and latch in 2 hours. Brought [place] [facility] information sheet and discussed with Jan RN that mother can be set up once she is rested, maybe in morning.  Feed on demand approximately 8-12 times per day.          Maternal Data    Feeding Feeding Type: Breast Fed  LATCH Score                   Interventions    Lactation Tools Discussed/Used     Consult Status      BCarlye Grippe5/31/2020, 11:04 PM

## 2019-03-07 NOTE — Progress Notes (Addendum)
Tracing reviewed, recommend c/s.  Hannah Acosta, CNM notified and discussed with patient.  Pt not progressing and now having late decelerations.

## 2019-03-07 NOTE — Progress Notes (Signed)
Subjective: Pt resting, feeling more pressure.  Restart pitocin after an hour break.    Objective: BP 123/67   Pulse 88   Temp 98.6 F (37 C) (Oral)   Resp 18   Ht 5\' 5"  (1.651 m)   Wt 129.5 kg   SpO2 97%   BMI 47.49 kg/m  I/O last 3 completed shifts: In: 1881.8 [I.V.:1881.8] Out: 1250 [Urine:1250] Total I/O In: 2402.9 [I.V.:2402.9] Out: 550 [Urine:550]  FHT: Category 1 FHT 135 accels noted.  No late or variable decels UC:   regular, every 4-5 minutes SVE:   Dilation: Lip/rim Effacement (%): 100 Station: -2 Exam by:: Bernerd Pho, CNM Pitocin at restart at 10  Assessment:  G2P1001 at 37.1 weeks induction for GHTN Hx of LTCS  Cat 2 strip GBS negative Desires permanent sterilization  Plan: Discussed with patient and husband that progressing but head still high.  Discussed Cat 1 tracing.  Will restart pitocin to increase MVU.  If fetus does not tolerate increase of contractions repeat LTCS recommended.  Pt and husband agree with plan of care.  Kenney Houseman CNM, MSN 03/07/2019, 3:08 PM

## 2019-03-07 NOTE — Progress Notes (Signed)
Subjective: Pt feeling more pressure and cramping.  RN asked to evaluate strip  Objective: BP 120/72   Pulse 98   Temp 98.6 F (37 C) (Oral)   Resp 18   Ht 5\' 5"  (1.651 m)   Wt 129.5 kg   SpO2 97%   BMI 47.49 kg/m  I/O last 3 completed shifts: In: 1881.8 [I.V.:1881.8] Out: 1250 [Urine:1250] Total I/O In: 2402.9 [I.V.:2402.9] Out: 550 [Urine:550]  FHT: Category 1 FHT 120 accels noted, marked variability noted with position change BL 120.   UC:   Every 2 lasting 50 seconds. SVE:   Dilation: 6.5 Effacement (%): 100 Station: -2 Exam by:: Bernerd Pho, CNM Pitocin at 21 mu   Assessment:  G2P1001 at 37.1 weeks induction for GHTN Hx of LTCS  Cat 2 strip GBS negative Desires permanent sterilization Hx of depression and anxiety  Plan: Discussed with patient cervical change, discussed station of head still high.  Discussed that currently has a cat 2 strip.  If strip continues or head remains high I recommend a cesarean section. Discussed with patient and husband risk increased with uncontrolled situation.  Pt and husband desires to continue trying for a vaginal birth.   Kenney Houseman CNM, MSN 03/07/2019, 1:27 PM

## 2019-03-07 NOTE — Op Note (Addendum)
Cesarean Section Procedure Note  Indications: P1 at 2237 1/7wks with failure to descend and fetal intolerance of labor desiring sterilization via bilateral salpingectomy and pt agreeing to proceed with c-section now.  Pre-operative Diagnosis: 1.37 1/7wks 2.failure to descend 3.fetal intolerance to labor 4.desires sterilization   Post-operative Diagnosis: 1.37 1/7wks 2.failure to descend 3.fetal intolerance to labor 4.desires sterilization 5.Uterine Dehiscence/Rupture 6.OP  Procedure: 1.Repeat C-Section 2.Bilateral Salpingectomy   Surgeon: Osborn Cohooberts, Amillia Biffle, MD    Assistants: Surgical Tech  Anesthesia: Epidural  Anesthesiologist: Achille RichHodierne, Adam, MD   Procedure Details  The patient was taken to the operating room after the risks, benefits, complications, treatment options, and expected outcomes were discussed with the patient.  The patient concurred with the proposed plan, giving informed consent which was signed and witnessed. The patient was taken to Operating Room C, identified as Hannah Acosta and the procedure verified as C-Section Delivery. A Time Out was held and the above information confirmed.  After induction of anesthesia by obtaining a surgical level via the spinal, the patient was prepped and draped in the usual sterile manner. A Pfannenstiel skin incision was made and carried down through the subcutaneous tissue to the underlying layer of fascia.  The fascia was incised bilaterally and extended transversely bilaterally with the Mayo scissors. Kocher clamps were placed on the inferior aspect of the fascial incision and the underlying rectus muscle was separated from the fascia. The same was done on the superior aspect of the fascial incision.  The peritoneum was identified, entered bluntly and extended manually. The entire inside of the abdomen was edematous with bulbous adhesions everywhere due to leaking of fluid through a 2-3cm size uterine dehiscence.  Adhesions were taken taken down  sharply and bluntly.  An Alexis self-retaining retractor was placed.  The utero-vesical peritoneal reflection was incised transversely and the bladder flap was bluntly freed from the lower uterine segment. A low dehisced area was extended with the bandage scissors and infant delivered with great effort due to size of the baby.  The infant was delivered in vertex position. After the umbilical cord was clamped and cut, the infant was handed to the awaiting pediatricians.  Cord blood was obtained for evaluation as well as cord gases.  The placenta was removed intact and appeared to be within normal limits. The uterus was cleared of all clots and debris. Bilateral uterine extensions were noted and repaired with a running interlocking stitch of 0 vicryl.  The uterine incision was closed with running interlocking sutures of 0 Vicryl and a second imbricating layer was performed as well.   Bilateral tubes and ovaries appeared to be within normal limits.  Good hemostasis was noted. The left fallopian tube was grasped in the midportion with a babcock after carrying it out to its fimbriated end, clamped from the isthmic region to the fimbriated end and the tube excised and sent to pathology.  The tube was suture ligated twice with 2-0 vicryl and the ligated with a free tie of 2-0 vicryl.  The remaining pedicle was cauterized with the bovie. The same was done on the contralateral side.  Hemostatic powder was placed over all repaired uterine incision to ensure hemostasis.  The peritoneum was repaired with 2-0 chromic via a running suture.  The fascia was reapproximated with a running suture of 0 Vicryl. The subcutaneous tissue was reapproximated with 3 interrupted sutures of 2-0 plain.  The skin was reapproximated with a subcuticular suture of 3-0 monocryl.  Steristrips were applied with benzoin  and honeycomb dressing placed.  Instrument, sponge, and needle counts were correct prior to abdominal closure and at the conclusion  of the case.  The patient was awaiting transfer to the recovery room in good condition.  Findings: Live female infant with Apgars 1 at one minute and 8 at five minutes.  Normal appearing bilateral ovaries and fallopian tubes were noted.  Arterial and Venous cord gases were both greater than 7.2.  Estimated Blood Loss:          Drains: foley to gravity clear urine 100 cc         Total IV Fluids: 1600 ml         Specimens to Pathology: Placenta and Bilateral Fallopian Tubes         Complications:  None; patient tolerated the procedure well.         Disposition: PACU - hemodynamically stable.         Condition: stable  Attending Attestation: I performed the procedure.

## 2019-03-08 ENCOUNTER — Encounter (HOSPITAL_COMMUNITY): Payer: Self-pay | Admitting: Obstetrics and Gynecology

## 2019-03-08 LAB — COMPREHENSIVE METABOLIC PANEL
ALT: 10 U/L (ref 0–44)
AST: 19 U/L (ref 15–41)
Albumin: 1.9 g/dL — ABNORMAL LOW (ref 3.5–5.0)
Alkaline Phosphatase: 71 U/L (ref 38–126)
Anion gap: 9 (ref 5–15)
BUN: <5 mg/dL — ABNORMAL LOW (ref 6–20)
CO2: 20 mmol/L — ABNORMAL LOW (ref 22–32)
Calcium: 8.1 mg/dL — ABNORMAL LOW (ref 8.9–10.3)
Chloride: 104 mmol/L (ref 98–111)
Creatinine, Ser: 0.59 mg/dL (ref 0.44–1.00)
GFR calc Af Amer: >60 mL/min (ref >60)
GFR calc non Af Amer: >60 mL/min (ref >60)
Glucose, Bld: 111 mg/dL — ABNORMAL HIGH (ref 70–99)
Potassium: 3.4 mmol/L — ABNORMAL LOW (ref 3.5–5.1)
Sodium: 133 mmol/L — ABNORMAL LOW (ref 135–145)
Total Bilirubin: 1.2 mg/dL (ref 0.3–1.2)
Total Protein: 4.2 g/dL — ABNORMAL LOW (ref 6.5–8.1)

## 2019-03-08 LAB — CBC
HCT: 29.3 % — ABNORMAL LOW (ref 36.0–46.0)
Hemoglobin: 9.9 g/dL — ABNORMAL LOW (ref 12.0–15.0)
MCH: 30.5 pg (ref 26.0–34.0)
MCHC: 33.8 g/dL (ref 30.0–36.0)
MCV: 90.2 fL (ref 80.0–100.0)
Platelets: 146 10*3/uL — ABNORMAL LOW (ref 150–400)
RBC: 3.25 MIL/uL — ABNORMAL LOW (ref 3.87–5.11)
RDW: 15.4 % (ref 11.5–15.5)
WBC: 17.7 10*3/uL — ABNORMAL HIGH (ref 4.0–10.5)
nRBC: 0 % (ref 0.0–0.2)

## 2019-03-08 MED ORDER — FUROSEMIDE 10 MG/ML IJ SOLN
20.0000 mg | Freq: Once | INTRAMUSCULAR | Status: AC
  Administered 2019-03-08: 17:00:00 20 mg via INTRAVENOUS
  Filled 2019-03-08: qty 2

## 2019-03-08 MED ORDER — LACTATED RINGERS IV BOLUS: 500.0000 mL | Freq: Once | INTRAVENOUS | Status: DC

## 2019-03-08 MED ORDER — POTASSIUM CHLORIDE 10 MEQ/100ML IV SOLN
10.0000 meq | INTRAVENOUS | Status: AC
  Administered 2019-03-08 (×2): 10 meq via INTRAVENOUS
  Filled 2019-03-08 (×4): qty 100

## 2019-03-08 MED ORDER — LACTATED RINGERS IV BOLUS
500.0000 mL | Freq: Once | INTRAVENOUS | Status: AC
  Administered 2019-03-08: 05:00:00 500 mL via INTRAVENOUS

## 2019-03-08 MED ORDER — SODIUM CHLORIDE 0.9 % IV SOLN
INTRAVENOUS | Status: DC
  Administered 2019-03-08 (×2): via INTRAVENOUS

## 2019-03-08 MED ORDER — LACTATED RINGERS IV SOLN: INTRAVENOUS | Status: DC

## 2019-03-08 MED ORDER — POTASSIUM CHLORIDE 10 MEQ/100ML IV SOLN: 10.0000 meq | INTRAVENOUS | Status: AC

## 2019-03-08 NOTE — Progress Notes (Signed)
Patient called out complaining of her arm "burning and on fire" since starting the Potassium bolus.  Patient stated no visual disturbances or heart palpitations.  This RN called Pharmacy to verify side effects of a Potassium bolus and was told these can be normal side effects.  Pharmacist instructed RN to change bolus rate to half of what was ordered.  Changed rate to 24mL/hr.  Encouraged patient to call out again if this reaction continues.  Before leaving the room, the patient did say that her arm felt better already.   Will continue to monitor. Dalonte Hardage D

## 2019-03-08 NOTE — Progress Notes (Signed)
Notified N. Prothero, CNM, of reduced fluid output in mom (20-25 ml/hr).  New order for 500 ml bolus/hr.

## 2019-03-08 NOTE — Lactation Note (Signed)
This note was copied from a baby's chart. Lactation Consultation Note  Patient Name: Hannah Acosta IRJJO'A Date: 03/08/2019 Reason for consult: Follow-up assessment;Difficult latch;Early term 68-38.6wks  P2 mother whose infant is now 52 hours old.  Mother breast fed her first child (now 31 years old) for 1 month and pumped for one year.  Mother has no intention of pumping that long again and would like to exclusively breast fed.  Mother had recently finished walking in the hallway and wanted to attempt to breast feed baby.  Suggested father awaken baby and mother feed STS.  Mother's breasts are large, soft and non tender and nipples are everted and intact.  Mother is familiar with hand expression and was able to express one colostrum drop from left breast.    Repeated attempts required to latch baby in the football hold on the left breast.  Gentle stimulation needed as baby was not interested in sucking.  She would take short sucking bursts with continued stimulation.  Observed her feeding on/off for 5 minutes while discussing breast feeding basics with mother.  Demonstrated breast compressions for mother and showed her proper finger and hand placement.  Provided support blanket rolls to make holding baby easier for mother.    Encouraged to continue feeding 8-12 times/24 hours or sooner if baby shows feeding cues.  Colostrum container provided for any EBM mother obtains with hand expression.  Milk storage times reviewed and finger feeding demonstrated.  Offered to initiate DEBP for increasing milk supply for ETI and mother agreeable.  Pump parts, assembly and disassembly was all familiar to mother.  Reviewed cleaning of pump parts.  #24 flange size is appropriate at this time.  However, informed mother that she may need a #27 when breasts fill.  Mother denied pain with pumping.    Mother has a DEBP for home use.  Father present and supportive.  Both parents receptive to learning.  Mother will call for  latch assistance as needed.  RN updated.   Maternal Data Formula Feeding for Exclusion: No Has patient been taught Hand Expression?: Yes Does the patient have breastfeeding experience prior to this delivery?: Yes  Feeding Feeding Type: Breast Fed  LATCH Score Latch: Repeated attempts needed to sustain latch, nipple held in mouth throughout feeding, stimulation needed to elicit sucking reflex.  Audible Swallowing: None  Type of Nipple: Everted at rest and after stimulation  Comfort (Breast/Nipple): Soft / non-tender  Hold (Positioning): Assistance needed to correctly position infant at breast and maintain latch.  LATCH Score: 6  Interventions Interventions: Breast feeding basics reviewed;Assisted with latch;Skin to skin;Breast massage;Hand express;Breast compression;Position options;Support pillows;Adjust position;DEBP  Lactation Tools Discussed/Used Tools: Pump Breast pump type: Double-Electric Breast Pump WIC Program: No Pump Review: Setup, frequency, and cleaning;Milk Storage(Reviewed) Initiated by:: Laureen Ochs Date initiated:: 03/09/19   Consult Status Consult Status: Follow-up Date: 03/09/19 Follow-up type: In-patient    Hannah Acosta 03/08/2019, 1:04 PM

## 2019-03-08 NOTE — Progress Notes (Signed)
CSW received consult for hx of Anxiety and Depression.  CSW met with MOB to offer support and complete assessment.    Upon entering the room CSW observed that MOB was lying in bed dozing off. CSW asked MOBif she would like for CSW to return and MOB expressed that CSW could stay as long as CSW continued to speak softly so that infant wouldn't be woken up. CSW agreeable and proceeded to speak with MOB. CSW congratulated MOB on the birth of infant as well as explained MOB of CSW's role in the hospital. MOB understanding and agreeable to speak with CSW. CSW advised MOB of the reason for the visit.   MOB expressed that she was diagnosed with anxiety and depression a "few years ago". Per MOB she experienced an increase in her anxiety and depression symptoms as COVID began. MOB reported that's he was started on Zoloft to help manege her symptoms which MOB reports has been working well for her at this time. MOB expressed that with being laid off of her job and having a 7 29 /31 year old at home and not being able to take her places, was really hard for MOB.   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 MOB reported that this lasted about 6 months for her and then she was okay. MOB reports that she had a therapist that she was working with but stopped going once COVID spiked. CSW addressed MOB's score on her [place]. MOB again reported that with the COVID virus and with her mental health history already, she had a harder time in dealing with her anxiety and her depression. MOB reports that she is feeling fine now and is tired. MOB expressed that she is not feeling SI or HI at this time. MOB also denies being involved in a DV situation as well.   CSW provided education regarding the baby blues period vs. perinatal mood disorders, discussed treatment and gave resources for mental health follow up if concerns arise.  CSW recommends self-evaluation during the postpartum time period using the New Mom  Checklist from Postpartum Progress and encouraged MOB to contact a medical professional if symptoms are noted at any time.   CSW provided review of Sudden Infant Death Syndrome (SIDS) precautions.   CSW identifies no further need for intervention and no barriers to discharge at this time.    Hannah Acosta, MSW, LCSW-[facility] [phone]

## 2019-03-08 NOTE — Progress Notes (Signed)
Subjective: Postpartum Day 1: Cesarean Delivery repeat Patient reports incisional pain, tolerating PO and + flatus.    Objective: Vital signs in last 24 hours: Temp:  [98.1 F (36.7 C)-99.2 F (37.3 C)] 98.7 F (37.1 C) (06/01 0410) Pulse Rate:  [86-108] 96 (06/01 0410) Resp:  [15-21] 16 (05/31 2245) BP: (90-161)/(57-120) 131/69 (06/01 0410) SpO2:  [95 %-98 %] 95 % (06/01 0410)  Physical Exam:  General: alert, cooperative and no distress Lochia: appropriate Uterine Fundus: firm Incision: covered DVT Evaluation: No evidence of DVT seen on physical exam.  Recent Labs    03/07/19 1954 03/08/19 0459  HGB 11.7* 9.9*  HCT 35.0* 29.3*    Intake/Output Summary (Last 24 hours) at 03/08/2019 4650 Last data filed at 03/08/2019 0453 Gross per 24 hour  Intake 5521.93 ml  Output 1900 ml  Net 3621.93 ml   Assessment/Plan:  Status post Cesarean section with bilateral tubal ligation Doing well postoperatively.  Decreased output IV bolus given will montior Continue current care. Desires to go home tomorrow  Henderson Newcomer Lynnette Pote 03/08/2019, 6:31 AM

## 2019-03-09 MED ORDER — FERROUS SULFATE 325 (65 FE) MG PO TABS
325.0000 mg | ORAL_TABLET | Freq: Every day | ORAL | Status: DC
  Administered 2019-03-09 – 2019-03-10 (×2): 325 mg via ORAL
  Filled 2019-03-09 (×2): qty 1

## 2019-03-09 NOTE — Lactation Note (Signed)
This note was copied from a baby's chart. Lactation Consultation Note  Patient Name: Hannah Acosta [id number] Date: 03/09/2019 Reason for consult: Follow-up assessment;Early term 37-38.6wks;Primapara;1st time breastfeeding  P2 mother whose infant is now 36 hours old.  Mother breast fed her first child (now 31 years old) for 1 month and pumped for one year.  Mother had just finished breast feeding when I arrived.  I was not able to observe a latch but mother stated that baby is feeding better today.  Mother continues to do hand expression before/after feeding and pumping.  I reminded her to be diligent with pumping every three hours or after baby breast feeds to increase supply.  Mother is supplementing with formula.  Suggested she increase her supplementation volumes and provided guidance on this.  Mother verbalized understanding.  I swaddled baby and suggested mother pump now and she was very willing to do this.  Reviewed pump and settings and observed pumping.  Mother is starting to get small volumes.  Encouraged mother to call for assistance as needed.  Father is on his way from home to the hospital and family is anticipating discharge tomorrow.     Maternal Data Formula Feeding for Exclusion: No Has patient been taught Hand Expression?: Yes Does the patient have breastfeeding experience prior to this delivery?: Yes  Feeding    LATCH Score                   Interventions    Lactation Tools Discussed/Used WIC Program: No Pump Review: Setup, frequency, and cleaning(Reviewed)   Consult Status Consult Status: Follow-up Date: 03/09/19 Follow-up type: In-patient    Daziah Hesler R Zori Benbrook 03/09/2019, 12:12 PM

## 2019-03-09 NOTE — Progress Notes (Signed)
Subjective: Postpartum Day 2: Cesarean Delivery Patient reports incisional pain, tolerating PO, + flatus and no problems voiding.  Patient reports incisional pain with movement and desires to remain another day. She is breast feeding and supplementing with formula. Her urine output was good overnight. No symptoms of preeclampsia, pressures are normotensive. Mild asymptomatic anemia.   Objective: Vitals:   03/08/19 1201 03/08/19 1700 03/08/19 2204 03/09/19 0553  BP: 132/89 136/88 (!) 120/56 127/74  Pulse: (!) 102 95 (!) 103 98  Resp: 20 20 18 18   Temp: 98.7 F (37.1 C) 98.4 F (36.9 C) 98.4 F (36.9 C) 97.6 F (36.4 C)  TempSrc: Oral Oral Oral Oral  SpO2: 98% 97%    Weight:      Height:        Intake/Output Summary (Last 24 hours) at 03/09/2019 0801 Last data filed at 03/09/2019 1937 Gross per 24 hour  Intake 3260 ml  Output 4375 ml  Net -1115 ml    Physical Exam:  General: alert and cooperative Lochia: appropriate Uterine Fundus: firm Incision: healing well, no significant drainage, no dehiscence, no significant erythema DVT Evaluation: No evidence of DVT seen on physical exam. Negative Homan's sign. No cords or calf tenderness. No significant calf/ankle edema.  Recent Labs    03/07/19 1954 03/08/19 0459  HGB 11.7* 9.9*  HCT 35.0* 29.3*    Assessment/Plan: Status post Cesarean section. Doing well postoperatively.  Continue current care.  Start PO iron QD for mild asymptomatic anemia.   Janeece Riggers 03/09/2019, 7:59 AM

## 2019-03-09 NOTE — Anesthesia Postprocedure Evaluation (Signed)
Anesthesia Post Note  Patient: Hannah Acosta  Procedure(s) Performed: CESAREAN SECTION (N/A Abdomen)     Patient location during evaluation: PACU Anesthesia Type: Epidural Level of consciousness: awake and alert Pain management: pain level controlled Vital Signs Assessment: post-procedure vital signs reviewed and stable Respiratory status: spontaneous breathing, nonlabored ventilation and respiratory function stable Cardiovascular status: stable Postop Assessment: no headache, no backache and epidural receding Anesthetic complications: no    Last Vitals:  Vitals:   03/08/19 2204 03/09/19 0553  BP: (!) 120/56 127/74  Pulse: (!) 103 98  Resp: 18 18  Temp: 36.9 C 36.4 C  SpO2:      Last Pain:  Vitals:   03/09/19 0900  TempSrc:   PainSc: 2                  Malonie Tatum L Disa Riedlinger

## 2019-03-10 ENCOUNTER — Encounter (HOSPITAL_COMMUNITY): Payer: Self-pay | Admitting: *Deleted

## 2019-03-10 MED ORDER — IBUPROFEN 600 MG PO TABS: 600.0000 mg | ORAL_TABLET | Freq: Three times a day (TID) | ORAL | 0 refills | Status: DC | PRN

## 2019-03-10 MED ORDER — OXYCODONE HCL 5 MG PO TABS: 5.0000 mg | ORAL_TABLET | Freq: Four times a day (QID) | ORAL | 0 refills | Status: AC | PRN

## 2019-03-10 NOTE — Discharge Summary (Signed)
OB Discharge Summary     Patient Name: Hannah HindsMary A Bas DOB: July 19, 1986 MRN: [id number]  Date of admission: 03/06/2019 Delivering MD: Osborn CohoOBERTS, ANGELA   Date of discharge: 03/10/2019  Admitting diagnosis: pregnancy Intrauterine pregnancy: 6448w4d     Secondary diagnosis:  Principal Problem:   Gestational hypertension Active Problems:   Depression   History of cesarean delivery affecting pregnancy   Desires VBAC (vaginal birth after cesarean) trial   Gestational hypertension affecting second pregnancy  Discharge diagnosis: Term Pregnancy Delivered, CHTN and Anemia                                                                                                Post partum procedures:n/a  Augmentation: AROM, Pitocin and Foley Balloon  Complications: None  Hospital course:  Induction of Labor With Cesarean Section  33 y.o. yo G2P1001 at 5748w4d was admitted to the hospital 03/06/2019 for induction of labor. The patient went for cesarean section due to Non-Reassuring FHR, and delivered a Viable infant,03/07/2019  Membrane Rupture Time/Date: 10:23 PM ,03/06/2019   Details of operation can be found in separate operative Note.  Patient had an uncomplicated postpartum course. She is ambulating, tolerating a regular diet, passing flatus, and urinating well.  Patient is discharged home in stable condition on 03/10/19.                                    Physical exam  Vitals:   03/08/19 1700 03/08/19 2204 03/09/19 0553 03/09/19 1349  BP: 136/88 (!) 120/56 127/74 134/80  Pulse: 95 (!) 103 98 97  Resp: 20 18 18 16   Temp: 98.4 F (36.9 C) 98.4 F (36.9 C) 97.6 F (36.4 C) 98.3 F (36.8 C)  TempSrc: Oral Oral Oral Oral  SpO2: 97%     Weight:      Height:       General: alert, cooperative and no distress. Mood stable without symptoms of depression.  Lochia: appropriate Uterine Fundus: firm Incision: Healing well with no significant drainage, No significant erythema DVT Evaluation: No evidence  of DVT seen on physical exam. Negative Homan's sign. No cords or calf tenderness. No significant calf/ankle edema. Gestational hypertension: Patient denies symptoms of preeclampsia. Per Dr. Normand Sloopillard patient is stable for discharge home with 1 week follow up for blood pressure check. Patient to call or present to MAU with symptoms of preeclampsia.   Labs: Lab Results  Component Value Date   WBC 17.7 (H) 03/08/2019   HGB 9.9 (L) 03/08/2019   HCT 29.3 (L) 03/08/2019   MCV 90.2 03/08/2019   PLT 146 (L) 03/08/2019   CMP Latest Ref Rng & Units 03/08/2019  Glucose 70 - 99 mg/dL 981(X111(H)  BUN 6 - 20 mg/dL <9(J<5(L)  Creatinine 4.780.44 - 1.00 mg/dL 2.950.59  Sodium 621135 - 308145 mmol/L 133(L)  Potassium 3.5 - 5.1 mmol/L 3.4(L)  Chloride 98 - 111 mmol/L 104  CO2 22 - 32 mmol/L 20(L)  Calcium 8.9 - 10.3 mg/dL 8.1(L)  Total Protein 6.5 - 8.1 g/dL 6.5(H4.2(L)  Total Bilirubin 0.3 - 1.2 mg/dL 1.2  Alkaline Phos 38 - 126 U/L 71  AST 15 - 41 U/L 19  ALT 0 - 44 U/L 10    Discharge instruction: per After Visit Summary and "Baby and Me Booklet". Preeclampsia and postpartum depression precautions discussed and handout provided.   After visit meds:  Allergies as of 03/10/2019   No Known Allergies     Medication List    STOP taking these medications   magnesium oxide 400 (241.3 Mg) MG tablet Commonly known as:  MAG-OX     TAKE these medications   ibuprofen 600 MG tablet Commonly known as:  ADVIL Take 1 tablet (600 mg total) by mouth every 8 (eight) hours as needed for mild pain or moderate pain. What changed:    when to take this  reasons to take this   iron polysaccharides 150 MG capsule Commonly known as:  NIFEREX Take 1 capsule (150 mg total) by mouth daily.   oxyCODONE 5 MG immediate release tablet Commonly known as:  Oxy IR/ROXICODONE Take 1 tablet (5 mg total) by mouth every 6 (six) hours as needed for up to 7 days for severe pain. What changed:    when to take this  reasons to take this    prenatal multivitamin Tabs tablet Take 1 tablet by mouth daily at 12 noon.   sertraline 50 MG tablet Commonly known as:  ZOLOFT Take 50 mg by mouth daily.       Diet: routine diet, continue PO Iron for mild asymptomatic anemia   Activity: Advance as tolerated. Pelvic rest for 6 weeks.   Outpatient follow up:1 week for blood pressure and incision check and early postpartum depression screen, 6 weeks for postpartum visit Follow up Appt:No future appointments. Follow up Visit:No follow-ups on file.  Postpartum contraception: Tubal Ligation  Newborn Data: Live born female  Birth Weight: 9 lb 1.7 oz (4131 g) APGAR: 1, 8  Newborn Delivery   Birth date/time:  03/07/2019 17:52:00 Delivery type:  C-Section, Low Transverse Trial of labor:  Yes C-section categorization:  Repeat     Baby Feeding: Breast Disposition:home with mother   03/10/2019 Janeece Riggers, CNM

## 2019-03-10 NOTE — Discharge Instructions (Signed)
Postpartum Care After Vaginal Delivery ° °The period of time right after you deliver your newborn is called the postpartum period. °What kind of medical care will I receive? °· You may continue to receive fluids and medicines through an IV tube inserted into one of your veins. °· If an incision was made near your vagina (episiotomy) or if you had some vaginal tearing during delivery, cold compresses may be placed on your episiotomy or your tear. This helps to reduce pain and swelling. °· You may be given a squirt bottle to use when you go to the bathroom. You may use this until you are comfortable wiping as usual. To use the squirt bottle, follow these steps: °? Before you urinate, fill the squirt bottle with warm water. Do not use hot water. °? After you urinate, while you are sitting on the toilet, use the squirt bottle to rinse the area around your urethra and vaginal opening. This rinses away any urine and blood. °? You may do this instead of wiping. As you start healing, you may use the squirt bottle before wiping yourself. Make sure to wipe gently. °? Fill the squirt bottle with clean water every time you use the bathroom. °· You will be given sanitary pads to wear. °How can I expect to feel? °· You may not feel the need to urinate for several hours after delivery. °· You will have some soreness and pain in your abdomen and vagina. °· If you are breastfeeding, you may have uterine contractions every time you breastfeed for up to several weeks postpartum. Uterine contractions help your uterus return to its normal size. °· It is normal to have vaginal bleeding (lochia) after delivery. The amount and appearance of lochia is often similar to a menstrual period in the first week after delivery. It will gradually decrease over the next few weeks to a dry, yellow-brown discharge. For most women, lochia stops completely by 6-8 weeks after delivery. Vaginal bleeding can vary from woman to woman. °· Within the first few  days after delivery, you may have breast engorgement. This is when your breasts feel heavy, full, and uncomfortable. Your breasts may also throb and feel hard, tightly stretched, warm, and tender. After this occurs, you may have milk leaking from your breasts. Your health care provider can help you relieve discomfort due to breast engorgement. Breast engorgement should go away within a few days. °· You may feel more sad or worried than normal due to hormonal changes after delivery. These feelings should not last more than a few days. If these feelings do not go away after several days, speak with your health care provider. °How should I care for myself? °· Tell your health care provider if you have pain or discomfort. °· Drink enough water to keep your urine clear or pale yellow. °· Wash your hands thoroughly with soap and water for at least 20 seconds after changing your sanitary pads, after using the toilet, and before holding or feeding your baby. °· If you are not breastfeeding, avoid touching your breasts a lot. Doing this can make your breasts produce more milk. °· If you become weak or lightheaded, or you feel like you might faint, ask for help before: °? Getting out of bed. °? Showering. °· Change your sanitary pads frequently. Watch for any changes in your flow, such as a sudden increase in volume, a change in color, the passing of large blood clots. If you pass a blood clot from your vagina,   save it to show to your health care provider. Do not flush blood clots down the toilet without having your health care provider look at them. °· Make sure that all your vaccinations are up to date. This can help protect you and your baby from getting certain diseases. You may need to have immunizations done before you leave the hospital. °· If desired, talk with your health care provider about methods of family planning or birth control (contraception). °How can I start bonding with my baby? °Spending as much time as  possible with your baby is very important. During this time, you and your baby can get to know each other and develop a bond. Having your baby stay with you in your room (rooming in) can give you time to get to know your baby. Rooming in can also help you become comfortable caring for your baby. Breastfeeding can also help you bond with your baby. °How can I plan for returning home with my baby? °· Make sure that you have a car seat installed in your vehicle. °? Your car seat should be checked by a certified car seat installer to make sure that it is installed safely. °? Make sure that your baby fits into the car seat safely. °· Ask your health care provider any questions you have about caring for yourself or your baby. Make sure that you are able to contact your health care provider with any questions after leaving the hospital. °This information is not intended to replace advice given to you by your health care provider. Make sure you discuss any questions you have with your health care provider. °Document Released: 07/21/2007 Document Revised: 02/26/2016 Document Reviewed: 08/28/2015 °Elsevier Interactive Patient Education © 2018 [employer] ° ° °Postpartum Depression and Baby Blues °The postpartum period begins right after the birth of a baby. During this time, there is often a great amount of joy and excitement. It is also a time of many changes in the life of the parents. Regardless of how many times a mother gives birth, each child brings new challenges and dynamics to the family. It is not unusual to have feelings of excitement along with confusing shifts in moods, emotions, and thoughts. All mothers are at risk of developing postpartum depression or the "baby blues." These mood changes can occur right after giving birth, or they may occur many months after giving birth. The baby blues or postpartum depression can be mild or severe. Additionally, postpartum depression can go away rather quickly, or it can  be a long-term condition. °What are the causes? °Raised hormone levels and the rapid drop in those levels are thought to be a main cause of postpartum depression and the baby blues. A number of hormones change during and after pregnancy. Estrogen and progesterone usually decrease right after the delivery of your baby. The levels of thyroid hormone and various cortisol steroids also rapidly drop. Other factors that play a role in these mood changes include major life events and genetics. °What increases the risk? °If you have any of the following risks for the baby blues or postpartum depression, know what symptoms to watch out for during the postpartum period. Risk factors that may increase the likelihood of getting the baby blues or postpartum depression include: °· Having a personal or family history of depression. °· Having depression while being pregnant. °· Having premenstrual mood issues or mood issues related to oral contraceptives. °· Having a lot of life stress. °· Having marital conflict. °· Lacking   a social support network. °· Having a baby with special needs. °· Having health problems, such as diabetes. ° °What are the signs or symptoms? °Symptoms of baby blues include: °· Brief changes in mood, such as going from extreme happiness to sadness. °· Decreased concentration. °· Difficulty sleeping. °· Crying spells, tearfulness. °· Irritability. °· Anxiety. ° °Symptoms of postpartum depression typically begin within the first month after giving birth. These symptoms include: °· Difficulty sleeping or excessive sleepiness. °· Marked weight loss. °· Agitation. °· Feelings of worthlessness. °· Lack of interest in activity or food. ° °Postpartum psychosis is a very serious condition and can be dangerous. Fortunately, it is rare. Displaying any of the following symptoms is cause for immediate medical attention. Symptoms of postpartum psychosis include: °· Hallucinations and delusions. °· Bizarre or disorganized  behavior. °· Confusion or disorientation. ° °How is this diagnosed? °A diagnosis is made by an evaluation of your symptoms. There are no medical or lab tests that lead to a diagnosis, but there are various questionnaires that a health care provider may use to identify those with the baby blues, postpartum depression, or psychosis. Often, a screening tool called the Edinburgh Postnatal Depression Scale is used to diagnose depression in the postpartum period. °How is this treated? °The baby blues usually goes away on its own in 1-2 weeks. Social support is often all that is needed. You will be encouraged to get adequate sleep and rest. Occasionally, you may be given medicines to help you sleep. °Postpartum depression requires treatment because it can last several months or longer if it is not treated. Treatment may include individual or group therapy, medicine, or both to address any social, physiological, and psychological factors that may play a role in the depression. Regular exercise, a healthy diet, rest, and social support may also be strongly recommended. °Postpartum psychosis is more serious and needs treatment right away. Hospitalization is often needed. °Follow these instructions at home: °· Get as much rest as you can. Nap when the baby sleeps. °· Exercise regularly. Some women find yoga and walking to be beneficial. °· Eat a balanced and nourishing diet. °· Do little things that you enjoy. Have a cup of tea, take a bubble bath, read your favorite magazine, or listen to your favorite music. °· Avoid alcohol. °· Ask for help with household chores, cooking, grocery shopping, or running errands as needed. Do not try to do everything. °· Talk to people close to you about how you are feeling. Get support from your partner, family members, friends, or other new moms. °· Try to stay positive in how you think. Think about the things you are grateful for. °· Do not spend a lot of time alone. °· Only take  over-the-counter or prescription medicine as directed by your health care provider. °· Keep all your postpartum appointments. °· Let your health care provider know if you have any concerns. °Contact a health care provider if: °You are having a reaction to or problems with your medicine. °Get help right away if: °· You have suicidal feelings. °· You think you may harm the baby or someone else. °This information is not intended to replace advice given to you by your health care provider. Make sure you discuss any questions you have with your health care provider. °Document Released: 06/27/2004 Document Revised: 02/29/2016 Document Reviewed: 07/05/2013 °Elsevier Interactive Patient Education © 2017 [employer] ° ° °Preeclampsia and Eclampsia ° °Preeclampsia is a serious condition that may develop during pregnancy. It   is also called toxemia of pregnancy. This condition causes high blood pressure along with other symptoms, such as swelling and headaches. These symptoms may develop as the condition gets worse. Preeclampsia may occur at 20 weeks of pregnancy or later. °Diagnosing and treating preeclampsia early is very important. If not treated early, it can cause serious problems for you and your baby. One problem it can lead to is eclampsia. Eclampsia is a condition that causes muscle jerking or shaking (convulsions or seizures) and other serious problems for the mother. During pregnancy, delivering your baby may be the best treatment for preeclampsia or eclampsia. For most women, preeclampsia and eclampsia symptoms go away after giving birth. °In rare cases, a woman may develop preeclampsia after giving birth (postpartum preeclampsia). This usually occurs within 48 hours after childbirth but may occur up to 6 weeks after giving birth. °What are the causes? °The cause of preeclampsia is not known. °What increases the risk? °The following risk factors make you more likely to develop preeclampsia: °· Being pregnant for  the first time. °· Having had preeclampsia during a past pregnancy. °· Having a family history of preeclampsia. °· Having high blood pressure. °· Being pregnant with more than one baby. °· Being 35 or older. °· Being African-American. °· Having kidney disease or diabetes. °· Having medical conditions such as lupus or blood diseases. °· Being very overweight (obese). °What are the signs or symptoms? °The earliest signs of preeclampsia are: °· High blood pressure. °· Increased protein in your urine. Your health care provider will check for this at every visit before you give birth (prenatal visit). °Other symptoms that may develop as the condition gets worse include: °· Severe headaches. °· Sudden weight gain. °· Swelling of the hands, face, legs, and feet. °· Nausea and vomiting. °· Vision problems, such as blurred or double vision. °· Numbness in the face, arms, legs, and feet. °· Urinating less than usual. °· Dizziness. °· Slurred speech. °· Abdominal pain, especially upper abdominal pain. °· Convulsions or seizures. °How is this diagnosed? °There are no screening tests for preeclampsia. Your health care provider will ask you about symptoms and check for signs of preeclampsia during your prenatal visits. You may also have tests that include: °· Urine tests. °· Blood tests. °· Checking your blood pressure. °· Monitoring your baby’s heart rate. °· Ultrasound. °How is this treated? °You and your health care provider will determine the treatment approach that is best for you. Treatment may include: °· Having more frequent prenatal exams to check for signs of preeclampsia, if you have an increased risk for preeclampsia. °· Medicine to lower your blood pressure. °· Staying in the hospital, if your condition is severe. There, treatment will focus on controlling your blood pressure and the amount of fluids in your body (fluid retention). °· Taking medicine (magnesium sulfate) to prevent seizures. This may be given as an  injection or through an IV. °· Taking a low-dose aspirin during your pregnancy. °· Delivering your baby early, if your condition gets worse. You may have your labor started with medicine (induced), or you may have a cesarean delivery. °Follow these instructions at home: °Eating and drinking ° °· Drink enough fluid to keep your urine pale yellow. °· Avoid caffeine. °Lifestyle °· Do not use any products that contain nicotine or tobacco, such as cigarettes and e-cigarettes. If you need help quitting, ask your health care provider. °· Do not use alcohol or drugs. °· Avoid stress as much as possible. Rest   and get plenty of sleep. °General instructions °· Take over-the-counter and prescription medicines only as told by your health care provider. °· When lying down, lie on your left side. This keeps pressure off your major blood vessels. °· When sitting or lying down, raise (elevate) your feet. Try putting some pillows underneath your lower legs. °· Exercise regularly. Ask your health care provider what kinds of exercise are best for you. °· Keep all follow-up and prenatal visits as told by your health care provider. This is important. °How is this prevented? °There is no known way of preventing preeclampsia or eclampsia from developing. However, to lower your risk of complications and detect problems early: °· Get regular prenatal care. Your health care provider may be able to diagnose and treat the condition early. °· Maintain a healthy weight. Ask your health care provider for help managing weight gain during pregnancy. °· Work with your health care provider to manage any long-term (chronic) health conditions you have, such as diabetes or kidney problems. °· You may have tests of your blood pressure and kidney function after giving birth. °· Your health care provider may have you take low-dose aspirin during your next pregnancy. °Contact a health care provider if: °· You have symptoms that your health care provider told  you may require more treatment or monitoring, such as: °? Headaches. °? Nausea or vomiting. °? Abdominal pain. °? Dizziness. °? Light-headedness. °Get help right away if: °· You have severe: °? Abdominal pain. °? Headaches that do not get better. °? Dizziness. °? Vision problems. °? Confusion. °? Nausea or vomiting. °· You have any of the following: °? A seizure. °? Sudden, rapid weight gain. °? Sudden swelling in your hands, ankles, or face. °? Trouble moving any part of your body. °? Numbness in any part of your body. °? Trouble speaking. °? Abnormal bleeding. °· You faint. °Summary °· Preeclampsia is a serious condition that may develop during pregnancy. It is also called toxemia of pregnancy. °· This condition causes high blood pressure along with other symptoms, such as swelling and headaches. °· Diagnosing and treating preeclampsia early is very important. If not treated early, it can cause serious problems for you and your baby. °· Get help right away if you have symptoms that your health care provider told you to watch for. °This information is not intended to replace advice given to you by your health care provider. Make sure you discuss any questions you have with your health care provider. °Document Released: 09/20/2000 Document Revised: 09/09/2017 Document Reviewed: 04/29/2016 °Elsevier Interactive Patient Education © 2019 [employer] Inc. ° °

## 2019-03-11 ENCOUNTER — Ambulatory Visit: Payer: Self-pay

## 2019-03-11 NOTE — Lactation Note (Signed)
This note was copied from a baby's chart. Lactation Consultation Note  Patient Name: Hannah Acosta [id number] Date: 03/11/2019 Reason for consult: Follow-up assessment;Early term 37-38.6wks;Hyperbilirubinemia   [id number] - [id number] - I followed up with Ms. Wiemer regarding her progress. She is now pumping approximately 20 mls/pump after feeding. Her plan is to breast feed baby first and pump and supplement following. Mom has supplementation guidelines.  Baby is still under phototherapy. Mom hopes for discharge today and is awaiting pediatric visit.  I reviewed pumping guidelines and offered to put in a referral to [facility] for a DEBP. Mom has a used Medela pump at home (pumped for one year).  I called [facility]IC and put in referral. I gave mom their information so she could call and certify delivery of baby and set up pump pick up appointment. We discussed [facility] loaner options if needed.  I recommended pumping 8 times a day with consistency, and I offered to put in a referral for a follow up lactation call and possible OP appointment. Mom agreed and asked for a call.  Pediatrician is [facility].  Community resources given.   Maternal Data Formula Feeding for Exclusion: No Does the patient have breastfeeding experience prior to this delivery?: Yes  Feeding Feeding Type: Breast Milk  Interventions Interventions: Breast feeding basics reviewed;DEBP(WIC Referral; OP referral)  Lactation Tools Discussed/Used WIC Program: Yes Pump Review: Setup, frequency, and cleaning   Consult Status Consult Status: PRN Follow-up type: Call as needed    Walker Shadow 03/11/2019, 9:54 AM

## 2019-08-12 ENCOUNTER — Other Ambulatory Visit: Payer: Self-pay

## 2019-08-12 DIAGNOSIS — Z20822 Contact with and (suspected) exposure to covid-19: Secondary | ICD-10-CM

## 2019-08-14 LAB — NOVEL CORONAVIRUS, NAA: SARS-CoV-2, NAA: NOT DETECTED

## 2019-08-30 ENCOUNTER — Other Ambulatory Visit: Payer: Self-pay

## 2019-08-30 ENCOUNTER — Encounter (HOSPITAL_COMMUNITY): Payer: Self-pay

## 2019-08-30 ENCOUNTER — Emergency Department (HOSPITAL_COMMUNITY)
Admission: EM | Admit: 2019-08-30 | Discharge: 2019-08-31 | Disposition: A | Discharge status: Other Acute Inpatient Hospital | Payer: Medicaid Other | Attending: Emergency Medicine | Admitting: Emergency Medicine

## 2019-08-30 DIAGNOSIS — F411 Generalized anxiety disorder: Secondary | ICD-10-CM | POA: Diagnosis not present

## 2019-08-30 DIAGNOSIS — R45851 Suicidal ideations: Secondary | ICD-10-CM | POA: Insufficient documentation

## 2019-08-30 DIAGNOSIS — F332 Major depressive disorder, recurrent severe without psychotic features: Secondary | ICD-10-CM | POA: Diagnosis not present

## 2019-08-30 DIAGNOSIS — Z79899 Other long term (current) drug therapy: Secondary | ICD-10-CM | POA: Insufficient documentation

## 2019-08-30 DIAGNOSIS — R7989 Other specified abnormal findings of blood chemistry: Secondary | ICD-10-CM | POA: Diagnosis present

## 2019-08-30 DIAGNOSIS — Z20828 Contact with and (suspected) exposure to other viral communicable diseases: Secondary | ICD-10-CM | POA: Insufficient documentation

## 2019-08-30 DIAGNOSIS — R946 Abnormal results of thyroid function studies: Secondary | ICD-10-CM | POA: Diagnosis not present

## 2019-08-30 DIAGNOSIS — F329 Major depressive disorder, single episode, unspecified: Secondary | ICD-10-CM

## 2019-08-30 DIAGNOSIS — I10 Essential (primary) hypertension: Secondary | ICD-10-CM

## 2019-08-30 DIAGNOSIS — E039 Hypothyroidism, unspecified: Secondary | ICD-10-CM | POA: Insufficient documentation

## 2019-08-30 DIAGNOSIS — F32A Depression, unspecified: Secondary | ICD-10-CM

## 2019-08-30 LAB — CBC
HCT: 42.2 % (ref 36.0–46.0)
Hemoglobin: 13.6 g/dL (ref 12.0–15.0)
MCH: 29.1 pg (ref 26.0–34.0)
MCHC: 32.2 g/dL (ref 30.0–36.0)
MCV: 90.4 fL (ref 80.0–100.0)
Platelets: 263 10*3/uL (ref 150–400)
RBC: 4.67 MIL/uL (ref 3.87–5.11)
RDW: 15 % (ref 11.5–15.5)
WBC: 9.4 10*3/uL (ref 4.0–10.5)
nRBC: 0 % (ref 0.0–0.2)

## 2019-08-30 LAB — COMPREHENSIVE METABOLIC PANEL
ALT: 48 U/L — ABNORMAL HIGH (ref 0–44)
AST: 72 U/L — ABNORMAL HIGH (ref 15–41)
Albumin: 5.2 g/dL — ABNORMAL HIGH (ref 3.5–5.0)
Alkaline Phosphatase: 76 U/L (ref 38–126)
Anion gap: 11 (ref 5–15)
BUN: 19 mg/dL (ref 6–20)
CO2: 23 mmol/L (ref 22–32)
Calcium: 9.2 mg/dL (ref 8.9–10.3)
Chloride: 104 mmol/L (ref 98–111)
Creatinine, Ser: 1.05 mg/dL — ABNORMAL HIGH (ref 0.44–1.00)
GFR calc Af Amer: >60 mL/min (ref >60)
GFR calc non Af Amer: >60 mL/min (ref >60)
Glucose, Bld: 88 mg/dL (ref 70–99)
Potassium: 3.8 mmol/L (ref 3.5–5.1)
Sodium: 138 mmol/L (ref 135–145)
Total Bilirubin: 0.7 mg/dL (ref 0.3–1.2)
Total Protein: 8.4 g/dL — ABNORMAL HIGH (ref 6.5–8.1)

## 2019-08-30 LAB — SALICYLATE LEVEL: Salicylate Lvl: <7 mg/dL (ref 2.8–30.0)

## 2019-08-30 LAB — I-STAT BETA HCG BLOOD, ED (MC, WL, AP ONLY): I-stat hCG, quantitative: <5 m[IU]/mL (ref <5)

## 2019-08-30 LAB — RAPID URINE DRUG SCREEN, HOSP PERFORMED
Amphetamines: NOT DETECTED
Barbiturates: NOT DETECTED
Benzodiazepines: NOT DETECTED
Cocaine: NOT DETECTED
Opiates: NOT DETECTED
Tetrahydrocannabinol: NOT DETECTED

## 2019-08-30 LAB — TSH: TSH: 234.971 u[IU]/mL — ABNORMAL HIGH (ref 0.350–4.500)

## 2019-08-30 LAB — ETHANOL: Alcohol, Ethyl (B): <10 mg/dL (ref <10)

## 2019-08-30 LAB — ACETAMINOPHEN LEVEL: Acetaminophen (Tylenol), Serum: <10 ug/mL — ABNORMAL LOW (ref 10–30)

## 2019-08-30 MED ORDER — LEVOTHYROXINE SODIUM 200 MCG PO TABS
200.0000 ug | ORAL_TABLET | Freq: Every day | ORAL | Status: DC
  Administered 2019-08-31: 200 ug via ORAL
  Filled 2019-08-30 (×2): qty 1

## 2019-08-30 NOTE — ED Triage Notes (Signed)
Patient states she was feeling suicidal today, but not now. Patient states she envisioned herself cutting her wrists and "It scared." Patient states she is six months post partum.  Patient denies any drug or alcohol use. Patienat denies any visual or auditory hallucinations.

## 2019-08-30 NOTE — Progress Notes (Signed)
Received Hannah Acosta from triage with an escort and security. She was oriented to her new environment and given fluids to drink. She was compliant with the repeat blood draw and the Covid test. She denied feeling suicidal at the present time, but remains depressed. She slept throughout the night.

## 2019-08-30 NOTE — ED Notes (Addendum)
Pt belonging: [employer] visa [id number], [employer] visa [id number], [employer] 3442, [employer] visa [id number], [employer] visa [id number], [employer] [place] [id number], one white cell phone with black outter box, two check books, one h[employer] car key, all lock up with security.  one med bottle of midol, [place] license place in locker 39 with PT belonging bag

## 2019-08-30 NOTE — ED Provider Notes (Signed)
[facility]-EMERGENCY DEPT Provider Note   CSN: [id number] Arrival date & time: 08/30/19  1752     History   Chief Complaint Chief Complaint  Patient presents with   Suicidal   Hypertension    HPI Hannah Acosta is a 33 y.o. female with past medical history of gestational hypertension, anxiety, depression, binge eating disorder, who presents today for evaluation of anxiety and depression along with suicidal thoughts.  She stated that over the past 6 months and she delivered she has had significant increase in anxiety and depression.  She states that today she had what she classifies as a breakdown.  She states that she cried for 4 hours today and had thoughts about wanting to slit her wrists.  She states that she feels guilty, along with hopeless.  She attributes being guilty to being apparent stating "you are damned if you do deemed if you do not."  She reports multiple recent stressors including lack of good sleep, and having a 42-year-old and an infant in the house. She denies any self-harm today.  She denies homicidal ideations.  She denies AVH.  She states that she has never been hospitalized for psychiatric reasons before.  She takes Zoloft as needed.  SheReports that her blood pressure is normally well controlled.  She reports that she feels like she has been PMS thing for the past month and states that she feels like her hormones are out of balance.     HPI  Past Medical History:  Diagnosis Date   Anemia    Anxiety    Depression    Pregnancy induced hypertension     Patient Active Problem List   Diagnosis Date Noted   Elevated TSH 08/31/2019   Hypothyroidism 08/31/2019   Gestational hypertension affecting second pregnancy 03/06/2019   Depression 03/05/2019   Gestational hypertension 03/05/2019   History of cesarean delivery affecting pregnancy 03/05/2019   Desires VBAC (vaginal birth after cesarean) trial 03/05/2019   Acute blood loss  anemia 08/19/2016   Cesarean delivery delivered 08/18/2016   Postpartum care following cesarean delivery (11/12) Indication: arrest of dilation 08/18/2016   Binge eating disorder 12/12/2015   Anxiety 05/11/2015   Morbid obesity with BMI of 45.0-49.9, adult (HCC) 05/11/2015    Past Surgical History:  Procedure Laterality Date   CESAREAN SECTION N/A 08/18/2016   Procedure: CESAREAN SECTION;  Surgeon: Genia Del, MD;  Location: [facility];  Service: Obstetrics;  Laterality: N/A;   CESAREAN SECTION N/A 03/07/2019   Procedure: CESAREAN SECTION;  Surgeon: Osborn Coho, MD;  Location: [facility] LD ORS;  Service: Obstetrics;  Laterality: N/A;     OB History    Gravida  2   Para  1   Term  1   Preterm      AB      Living  1     SAB      TAB      Ectopic      Multiple  0   Live Births  1            Home Medications    Prior to Admission medications   Medication Sig Start Date End Date Taking? Authorizing Provider  ibuprofen (ADVIL) 600 MG tablet Take 1 tablet (600 mg total) by mouth every 8 (eight) hours as needed for mild pain or moderate pain. 03/10/19  Yes Janeece Riggers, CNM  Prenatal Vit-Fe Fumarate-FA (PRENATAL MULTIVITAMIN) TABS tablet Take 1 tablet by mouth daily at 12  noon.   Yes [provider]  sertraline (ZOLOFT) 50 MG tablet Take 75 mg by mouth daily.    Yes [provider]  iron polysaccharides (NIFEREX) 150 MG capsule Take 1 capsule (150 mg total) by mouth daily. Patient not taking: Reported on 08/30/2019 08/22/16   Artelia Laroche, CNM    Family History Family History  Problem Relation Age of Onset   Alcohol abuse Mother    Anxiety disorder Mother    Hypertension Mother    Alcohol abuse Father    Cancer Maternal Grandmother    Anxiety disorder Maternal Grandmother    Hypertension Maternal Grandmother    Anxiety disorder Sister    Early death Maternal Uncle     Social History Social History    Tobacco Use   Smoking status: Never Smoker   Smokeless tobacco: Never Used  Substance Use Topics   Alcohol use: No   Drug use: No     Allergies   Patient has no known allergies.   Review of Systems Review of Systems  Constitutional: Negative for chills and fever.  Musculoskeletal: Negative for back pain and neck pain.  Psychiatric/Behavioral: Positive for behavioral problems, decreased concentration, dysphoric mood, sleep disturbance and suicidal ideas.  All other systems reviewed and are negative.    Physical Exam Updated Vital Signs BP (!) 148/116 (BP Location: Left Arm)    Pulse 73    Temp 98.7 F (37.1 C) (Oral)    Resp 18    Ht 5\' 5"  (1.651 m)    Wt 124.7 kg    SpO2 99%    BMI 45.76 kg/m   Physical Exam Vitals signs and nursing note reviewed.  Constitutional:      General: She is not in acute distress.    Appearance: She is well-developed. She is not diaphoretic.  HENT:     Head: Normocephalic and atraumatic.  Eyes:     General: No scleral icterus.       Right eye: No discharge.        Left eye: No discharge.     Conjunctiva/sclera: Conjunctivae normal.  Neck:     Musculoskeletal: Normal range of motion.  Cardiovascular:     Rate and Rhythm: Normal rate and regular rhythm.     Pulses: Normal pulses.  Pulmonary:     Effort: Pulmonary effort is normal. No respiratory distress.     Breath sounds: No stridor.  Abdominal:     General: There is no distension.  Musculoskeletal:        General: No deformity.     Right lower leg: No edema.     Left lower leg: No edema.  Skin:    General: Skin is warm and dry.  Neurological:     General: No focal deficit present.     Mental Status: She is alert.     Cranial Nerves: No cranial nerve deficit.     Motor: No abnormal muscle tone.  Psychiatric:        Speech: Speech normal.     Comments: She appears anxious.  She reports that she was tearful and crying for 4 hours today. She had SI of cutting her wrist.   She did not cut herself. She denies HI.  She denies thoughts about wanting to hurt her children stating "I would feel too guilty."      ED Treatments / Results  Labs (all labs ordered are listed, but only abnormal results are displayed) Labs Reviewed  COMPREHENSIVE METABOLIC PANEL -  Abnormal; Notable for the following components:      Result Value   Creatinine, Ser 1.05 (*)    Total Protein 8.4 (*)    Albumin 5.2 (*)    AST 72 (*)    ALT 48 (*)    All other components within normal limits  ACETAMINOPHEN LEVEL - Abnormal; Notable for the following components:   Acetaminophen (Tylenol), Serum <10 (*)    All other components within normal limits  TSH - Abnormal; Notable for the following components:   TSH 234.971 (*)    All other components within normal limits  SARS CORONAVIRUS 2 (TAT 6-24 HRS)  ETHANOL  SALICYLATE LEVEL  CBC  RAPID URINE DRUG SCREEN, HOSP PERFORMED  TSH  T4, FREE  I-STAT BETA HCG BLOOD, ED ([facility], [facility], [facility] ONLY)    EKG None  Radiology No results found.  Procedures Procedures (including critical care time)  Medications Ordered in ED Medications  levothyroxine (SYNTHROID) tablet 200 mcg (has no administration in time range)  ibuprofen (ADVIL) tablet 600 mg (has no administration in time range)  prenatal multivitamin tablet 1 tablet (has no administration in time range)  sertraline (ZOLOFT) tablet 75 mg (has no administration in time range)     Initial Impression / Assessment and Plan / ED Course  I have reviewed the triage vital signs and the nursing notes.  Pertinent labs & imaging results that were available during my care of the patient were reviewed by me and considered in my medical decision making (see chart for details).  Clinical Course as of Aug 30 58  Mon Aug 30, 2019  2227 Repeat and T4 ordered.   TSH(!): 234.971 [EH]  2247 Spoke with Dr. Lorin PicketScott from [facility]    [EH]  2253 Spoke with Dr. Everardo AllEllison of Endocrinology, recommended Synthroid  200-300 mcg/day, first dose tonight and to follow-up as an outpatient.   [EH]    Clinical Course User Index [EH] Cristina GongHammond, Amaia Lavallie W, PA-C      Patient presents today for evaluation of brief suicidal ideation along with depression.  She was reportedly seen by her OB/GYN recently who felt that her depression was severe enough that she required evaluation in the emergency room.  Patient reports that she has had general poor mood and today had an episode where she cried for approximately 4 hours.  She reports that she had thoughts about wanting to slit her wrists with a knife.  She denies any homicidal ideations or AVH.  She did report that she has felt like she is gaining weight and unsure why.  She states that she has started exercising however noticed that her rings have been too tight recently.  Labs are obtained and reviewed, CBC is unremarkable.  CMP shows mild transaminitis with an AST of 72, ALT of 78.  Tylenol level is negative. Slight level is undetected, UDS is unremarkable.  TSH is significantly elevated at 234.9.  I was able to speak with Dr. Everardo AllEllison who is on call for [facility] (whose input was very much appreciated) who recommended starting patient on Synthroid, 200-300 MCG today with follow-up as an outpatient.  Given the significantly abnormal TSH, a repeat TSH and T4 were both ordered.  TTS was consulted and home meds were ordered.  Recommend if patient is admitted she has TSH continue to be checked.  She will need discharge with continued rx for levothyroxine as long as repeat TSH is consistent with initial TSH.   Final Clinical Impressions(s) / ED Diagnoses  Final diagnoses:  Hypothyroidism, unspecified type  Suicidal ideation  Depression, unspecified depression type  Elevated TSH    ED Discharge Orders    None       Norman Clay 08/31/19 0114    Glynn Octave, MD 08/31/19 [phone]

## 2019-08-31 ENCOUNTER — Encounter (HOSPITAL_COMMUNITY): Payer: Self-pay | Admitting: *Deleted

## 2019-08-31 ENCOUNTER — Inpatient Hospital Stay (HOSPITAL_COMMUNITY)
Admission: AD | Admit: 2019-08-31 | Discharge: 2019-09-04 | DRG: 885 | Disposition: A | Discharge status: Home / Self Care | Payer: Medicaid Other | Source: Intra-hospital | Attending: Psychiatry | Admitting: Psychiatry

## 2019-08-31 DIAGNOSIS — R45851 Suicidal ideations: Secondary | ICD-10-CM | POA: Diagnosis present

## 2019-08-31 DIAGNOSIS — E039 Hypothyroidism, unspecified: Secondary | ICD-10-CM | POA: Diagnosis present

## 2019-08-31 DIAGNOSIS — Z20828 Contact with and (suspected) exposure to other viral communicable diseases: Secondary | ICD-10-CM | POA: Diagnosis present

## 2019-08-31 DIAGNOSIS — F411 Generalized anxiety disorder: Secondary | ICD-10-CM | POA: Diagnosis not present

## 2019-08-31 DIAGNOSIS — R7989 Other specified abnormal findings of blood chemistry: Secondary | ICD-10-CM | POA: Diagnosis present

## 2019-08-31 DIAGNOSIS — F322 Major depressive disorder, single episode, severe without psychotic features: Principal | ICD-10-CM | POA: Diagnosis present

## 2019-08-31 LAB — TSH: TSH: 222.307 u[IU]/mL — ABNORMAL HIGH (ref 0.350–4.500)

## 2019-08-31 LAB — SARS CORONAVIRUS 2 (TAT 6-24 HRS): SARS Coronavirus 2: NEGATIVE

## 2019-08-31 LAB — T4, FREE: Free T4: <0.25 ng/dL — ABNORMAL LOW (ref 0.61–1.12)

## 2019-08-31 MED ORDER — IBUPROFEN 600 MG PO TABS
600.0000 mg | ORAL_TABLET | Freq: Three times a day (TID) | ORAL | Status: DC | PRN
  Administered 2019-09-01 – 2019-09-04 (×4): 600 mg via ORAL
  Filled 2019-08-31 (×4): qty 1

## 2019-08-31 MED ORDER — SERTRALINE HCL 50 MG PO TABS
75.0000 mg | ORAL_TABLET | Freq: Every day | ORAL | Status: DC
  Administered 2019-08-31: 75 mg via ORAL
  Filled 2019-08-31: qty 2

## 2019-08-31 MED ORDER — LEVOTHYROXINE SODIUM 200 MCG PO TABS
200.0000 ug | ORAL_TABLET | Freq: Every day | ORAL | Status: DC
  Administered 2019-09-01 – 2019-09-04 (×4): 200 ug via ORAL
  Filled 2019-08-31 (×5): qty 1
  Filled 2019-08-31: qty 2
  Filled 2019-08-31: qty 1

## 2019-08-31 MED ORDER — SERTRALINE HCL 50 MG PO TABS
75.0000 mg | ORAL_TABLET | Freq: Every day | ORAL | Status: DC
  Administered 2019-09-01: 75 mg via ORAL
  Filled 2019-08-31 (×2): qty 1

## 2019-08-31 MED ORDER — IBUPROFEN 200 MG PO TABS
600.0000 mg | ORAL_TABLET | Freq: Three times a day (TID) | ORAL | Status: DC | PRN
  Administered 2019-08-31: 08:00:00 600 mg via ORAL
  Filled 2019-08-31: qty 3

## 2019-08-31 MED ORDER — PRENATAL MULTIVITAMIN CH
1.0000 | ORAL_TABLET | Freq: Every day | ORAL | Status: DC
  Administered 2019-08-31: 1 via ORAL
  Filled 2019-08-31: qty 1

## 2019-08-31 MED ORDER — PRENATAL MULTIVITAMIN CH
1.0000 | ORAL_TABLET | Freq: Every day | ORAL | Status: DC
  Administered 2019-09-01 – 2019-09-04 (×4): 1 via ORAL
  Filled 2019-08-31 (×5): qty 1

## 2019-08-31 NOTE — Progress Notes (Signed)
Admission note:  Patient is a 33 yo female admitted voluntarily to [facility] for depression and self-harm thoughts. Patient is post-partum; she delivered a female infant this past May. She also has a toddler 31 years of age. Patient states, "I've been overwhelmed."  Patient complains of the following depressive symptoms: crying spells, worrying, anxiety, irritability, self-harm thoughts. Patient states, "I envisioned cutting my wrists." She also stated that while she was pregnant, "I thought of hanging myself."  Patient has never acted on any SI currently or in the past. Patient denies any thoughts of self-harm now.  Patient states, "I thought I saw a small person in the Admit Room when I arrived here."  She states, "I've not been sleeping."  She denies any active HI/AVH.  She states she lives in [place] with her husband. She states her husband is supportive.

## 2019-08-31 NOTE — ED Notes (Signed)
Report called to [facility] RN at [facility]

## 2019-08-31 NOTE — Discharge Instructions (Signed)
While in the emergency room your thyroid TSH was very high suggestive of hypothyroidism.  You have been started on levothyroxine.  Please schedule an appointment with endocrinology.  They have a long wait list and will most likely not be able to see you until January or February.  It is important that you follow-up with your primary care doctor to ensure that you continue to get your levothyroxine and to have your TSH monitored.

## 2019-08-31 NOTE — ED Notes (Signed)
Dr Erenest Blank into see

## 2019-08-31 NOTE — ED Notes (Addendum)
Pt reports that she is 6 months post partum and has a hx of htn in pregnancy.  Pt reports not hx of thyroid problems and that at her last 6 week check her BP was OK and she has not been follow since.  Pt has noticed increased swqelling in her fingers recently.  Pt reports that she had on incident of suicidal thought during her pregnancy "envisioned my self hanging..."  Pt reports that she did not seek treatment and thought is was due to the hormonal fluctuations.  Pt reports that yesterday she felt "out of control.of my emotions..."had suicidal thoughts and envisioned her self cutting her wrist.  Pt reports that she did have som depression during the pregnancy, but it resolved, but she feels that it is coming back.  Pt reports that she had a tubal after the pregnancy and that her periods have changed, and that "some days I feel fine and some days I feel edgy, tired and exausted.  Pt reports that they also have a 31 yr old and that she is the primary care giver for both.  The baby is not sleeping thru the night and will not sleep in her bassinette, and sleeps with her in the bed  and feeds several times a night.  Pt is able to contract for safety, denies current si/hi/avh

## 2019-08-31 NOTE — ED Notes (Signed)
On the phone 

## 2019-08-31 NOTE — ED Notes (Signed)
Attempted to call report to [facility] will call back shortly

## 2019-08-31 NOTE — ED Notes (Signed)
Safe transport contacted for transport 

## 2019-08-31 NOTE — ED Notes (Signed)
Tom CSW into see 

## 2019-08-31 NOTE — BH Assessment (Signed)
Tele Assessment Note   Patient Name: Hannah Acosta MRN: [id number] Referring Physician: Lyndel SafeElizabeth Hammond, PA Location of [facility]: [facility] Location of Provider: Behavioral Health TTS Department  Hannah Acosta is an 33 y.o. female.  -Clinician reviewed note by Lyndel SafeElizabeth Hammond, PA.  Hannah Acosta is a 33 y.o. female with past medical history of gestational hypertension, anxiety, depression, binge eating disorder, who presents today for evaluation of anxiety and depression along with suicidal thoughts.  She stated that over the past 6 months and she delivered she has had significant increase in anxiety and depression.  She states that today she had what she classifies as a breakdown.  She states that she cried for 4 hours today and had thoughts about wanting to slit her wrists.  She states that she feels guilty, along with hopeless.  She attributes being guilty to being apparent stating "you are damned if you do deemed if you do not."  She reports multiple recent stressors including lack of good sleep, and having a 31-year-old and an infant in the house.  Patient has a flat affect throughout assessment.  She has good eye contact.  Her affect is congruent with being depressed and anxious.  Patient's thought process is logical and coherent.  Patient is not responding to internal stimuli.  Patient is calm and cooperative.  Patient says that yesterday (11/23) she started having thoughts of killing herself by cutting her wrists.  She says that she had thoughts of hanging herself a few months ago even when she was pregnant.  Patient denies current SI and she also has had no previous attempts.  Patient reports having increased anxiety.  She has had her 2nd daughter six months ago and has a 632 year old daughter in the home.  Pt says she has had little sleep in the past weeks.  Patient also talks about having PMS symptoms over the last month.  She reports low energy, reduced concentration.    Patient talked about  her husband being sober for 2 years.  She said that she and husband had taken in two children of husband's brother.  Patient says that was from November 2019 to March 2020.  She felt depressed during that time and had some suicidal thoughts.  Pt talked about having guilt feelings about having some promiscuous behavior when she gets very anxious and agitated.  Patient says that her husband is aware of this and they have been through counseling about this together.  Patient is guilty about having these feelings now.  Patient did used to see Vertell NovakCathy Glen at [facility] for a few months up to March 2020 and quit because of COVID restrictions.  She says she would like to go back to her.  Patient has not had inpatient psychiatric care before.    -Clinician discussed patient care with Nira ConnJason Berry, FNP.  He recommends inpatient psychiatric care.  Clinician informed Dierdre ForthHannah Muthersbaugh, PA of disposition.  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 Anemia   . Anxiety   . Depression   . Pregnancy induced hypertension     Past Surgical History:  Procedure Laterality Date  . CESAREAN SECTION N/A 08/18/2016   Procedure: CESAREAN SECTION;  Surgeon: Genia DelMarie-Lyne Lavoie, MD;  [facility];  Service: Obstetrics;  Laterality: N/A;  . CESAREAN SECTION N/A 03/07/2019   Procedure: CESAREAN SECTION;  Surgeon: Osborn Cohooberts, Angela, MD;  [facility];  Service: Obstetrics;  Laterality: N/A;    Family History:  Family History  Problem Relation Age of Onset  . Alcohol abuse Mother   . Anxiety disorder Mother   . Hypertension Mother   . Alcohol abuse Father   . Cancer Maternal Grandmother   . Anxiety disorder Maternal Grandmother   . Hypertension Maternal Grandmother   . Anxiety disorder Sister   . Early death Maternal Uncle     Social History:  reports that she has never smoked. She has never  used smokeless tobacco. She reports that she does not drink alcohol or use drugs.  Additional Social History:  Alcohol / Drug Use Pain Medications: None Prescriptions: Certraline 75mg  2x/D and prenatal vitamins Over the Counter: Ibuprophen, Midol History of alcohol / drug use?: No history of alcohol / drug abuse  CIWA: CIWA-Ar BP: (!) 148/116 Pulse Rate: 73 COWS:    Allergies: No Known Allergies  Home Medications: (Not in a hospital admission)   OB/GYN Status:  No LMP recorded.  General Assessment Data Location of Assessment: [facility] TTS Assessment: In system Is this a Tele or Face-to-Face Assessment?: Tele Assessment Is this an Initial Assessment or a Re-assessment for this encounter?: Initial Assessment Patient Accompanied by:: N/A Language Other than English: No Living Arrangements: Other (Comment)(Lives with husband and their two children.) What gender do you identify as?: Female Marital status: Married [place] name: Ernst Spell Pregnancy Status: No Living Arrangements: Spouse/significant other Can pt return to current living arrangement?: Yes Admission Status: Voluntary Is patient capable of signing voluntary admission?: Yes Referral Source: Self/Family/Friend(Husband brought her to hospital.) Insurance type: MCD     Crisis Care Plan Living Arrangements: Spouse/significant other Name of Psychiatrist: None Name of Therapist: None  Education Status Is patient currently in school?: No Is the patient employed, unemployed or receiving disability?: Unemployed  Risk to self with the past 6 months Suicidal Ideation: Yes-Currently Present Has patient been a risk to self within the past 6 months prior to admission? : No Suicidal Intent: No Has patient had any suicidal intent within the past 6 months prior to admission? : Yes Is patient at risk for suicide?: Yes Suicidal Plan?: Yes-Currently Present Has patient had any suicidal plan within the past 6 months prior to  admission? : Yes Specify Current Suicidal Plan: Cutting wrists Access to Means: Yes Specify Access to Suicidal Means: Sharps What has been your use of drugs/alcohol within the last 12 months?: Denies Previous Attempts/Gestures: No How many times?: 0 Other Self Harm Risks: None Triggers for Past Attempts: None known Intentional Self Injurious Behavior: None Family Suicide History: No Recent stressful life event(s): Other (Comment)(Post partum) Persecutory voices/beliefs?: Yes Depression: Yes Depression Symptoms: Despondent, Insomnia, Guilt, Loss of interest in usual pleasures, Feeling worthless/self pity, Tearfulness Substance abuse history and/or treatment for substance abuse?: No Suicide prevention information given to non-admitted patients: Not applicable  Risk to Others within the past 6 months Homicidal Ideation: No Does patient have any lifetime risk of violence toward others beyond the six months prior to admission? : No Thoughts of Harm to Others: No Current Homicidal Intent: No Current Homicidal Plan: No Access to Homicidal Means: No Identified Victim: No one History of harm to others?: No Assessment of Violence: None Noted Violent Behavior Description: None reported Does patient have access to weapons?: No Criminal Charges Pending?: No Does patient have a court date: No Is patient on probation?: No  Psychosis Hallucinations: None noted Delusions: None noted  Mental Status Report  Appearance/Hygiene: Disheveled, In scrubs Eye Contact: Good Motor Activity: Freedom of movement, Unremarkable Speech: Logical/coherent Level of Consciousness: Alert Mood: Depressed, Despair, Helpless, Sad Affect: Depressed Anxiety Level: Panic Attacks Panic attack frequency: infrequent Most recent panic attack: Can't recall Thought Processes: Coherent, Relevant Judgement: Unimpaired Orientation: Person, Place, Time, Situation Obsessive Compulsive Thoughts/Behaviors:  None  Cognitive Functioning Concentration: Decreased Memory: Remote Intact, Recent Impaired Is patient IDD: No Insight: Fair Impulse Control: Poor Appetite: Good Have you had any weight changes? : No Change Sleep: Decreased Total Hours of Sleep: (<4H/D) Vegetative Symptoms: Decreased grooming  ADLScreening [facility] Assessment Services) Patient's cognitive ability adequate to safely complete daily activities?: Yes Independently performs ADLs?: Yes (appropriate for developmental age)  Prior Inpatient Therapy Prior Inpatient Therapy: No  Prior Outpatient Therapy Prior Outpatient Therapy: Yes Prior Therapy Dates: Nov '2019 to March 202 Prior Therapy Facilty/Provider(s): Dr. Cephus Shelling. Reason for Treatment: counseling Does patient have an ACCT team?: No Does patient have Intensive In-House Services?  : No Does patient have [facility] services? : No Does patient have P4CC services?: No  ADL Screening (condition at time of admission) Patient's cognitive ability adequate to safely complete daily activities?: Yes Is the patient deaf or have difficulty hearing?: No Does the patient have difficulty seeing, even when wearing glasses/contacts?: No Does the patient have difficulty concentrating, remembering, or making decisions?: Yes Independently performs ADLs?: Yes (appropriate for developmental age) Does the patient have difficulty walking or climbing stairs?: No       Abuse/Neglect Assessment (Assessment to be complete while patient is alone) Abuse/Neglect Assessment Can Be Completed: Yes Physical Abuse: Denies Exploitation of patient/patient's resources: Denies Self-Neglect: Denies     Merchant navy officer (For Healthcare) Does Patient Have a Medical Advance Directive?: No Would patient like information on creating a medical advance directive?: Yes (ED - Information included in AVS)          Disposition:  Disposition Initial Assessment Completed for this Encounter:  Yes Patient referred to: Other (Comment)(To be reviewed by [facility])  This service was provided via telemedicine using a 2-way, interactive audio and video technology.  Names of all persons participating in this telemedicine service and their role in this encounter. Name: Addison Naegeli Role: patient  Name: Beatriz Stallion, M.S. LCAS QP Role: clinician  Name:  Role:   Name:  Role:     Alexandria Lodge 08/31/2019 2:08 AM

## 2019-08-31 NOTE — ED Notes (Signed)
Pt to contact her husband to bring her breast pump  And clothes to [facility]

## 2019-08-31 NOTE — BH Assessment (Signed)
[place] Assessment Progress Note  Per Shuvon Rankin, FNP, this pt requires psychiatric hospitalization at this time.  Nonah Mattes, RN has assigned pt to [facility] Rm [id number].  Pt has signed Voluntary Admission and Consent for Treatment, as well as Consent to Release Information to her therapist, Lennart Pall, PhD and to her husband, and a notification call has been placed to the former.  Signed forms have been faxed to [facility].  Pt's nurse, Narda Rutherford, has been notified, and agrees to send original paperwork along with pt via [facility], and to call report to [phone].  Jalene Mullet, [place] Coordinator [phone]

## 2019-08-31 NOTE — ED Notes (Signed)
Pt is aware that she will transport shortly to [facility]

## 2019-08-31 NOTE — ED Notes (Signed)
EDP updated and will eval. 

## 2019-08-31 NOTE — ED Notes (Signed)
On the phine

## 2019-08-31 NOTE — ED Notes (Signed)
Feeling better,

## 2019-08-31 NOTE — Discharge Summary (Signed)
  Patient to be transferred to [facility] for inpatient psychiatric treatment 

## 2019-08-31 NOTE — ED Notes (Addendum)
Pt ambulatory w/o difficulty w/ nt to transport to [facility] via safe transport, belongings given to transport service [place] NT

## 2019-08-31 NOTE — ED Provider Notes (Signed)
Care assumed from [facility], [place].  Please see her full H&P.  In short,  Hannah Acosta is a 33 y.o. female presents for Lorenz Coaster, depression and suicidal thoughts.  Patient with several symptoms of hypothyroid.  TSH checked by initial provider and found to be elevated at 234.9.  Consulted with [place] endocrinology who recommended starting patient on Synthroid.    Physical Exam  BP (!) 148/116 (BP Location: Left Arm)   Pulse 73   Temp 98.7 F (37.1 C) (Oral)   Resp 18   Ht 5\' 5"  (1.651 m)   Wt 124.7 kg   SpO2 99%   BMI 45.76 kg/m   Physical Exam Vitals signs and nursing note reviewed.  Constitutional:      General: She is not in acute distress.    Appearance: She is well-developed.  HENT:     Head: Normocephalic.  Eyes:     General: No scleral icterus.    Conjunctiva/sclera: Conjunctivae normal.  Neck:     Musculoskeletal: Normal range of motion.  Cardiovascular:     Rate and Rhythm: Normal rate.  Pulmonary:     Effort: Pulmonary effort is normal.  Musculoskeletal: Normal range of motion.  Skin:    General: Skin is warm and dry.  Neurological:     Mental Status: She is alert.     ED Course/Procedures   Clinical Course as of Aug 30 318  Mon Aug 30, 2019  2227 Repeat and T4 ordered.   TSH(!): 234.971 [EH]  2247 Spoke with Dr. Nicki Reaper from [facility]    [EH]  2253 Spoke with Dr. Loanne Drilling of Endocrinology, recommended Synthroid 200-300 mcg/day, first dose tonight and to follow-up as an outpatient.   [EH]  Tue Aug 31, 2019  0318 Repeat TSH remains significantly elevated.  No evidence of lab error.  TSH(!): 222.307 [HM]    Clinical Course User Index [EH] Lorin Glass, PA-C [HM] Charletta Voight, Jarrett Soho, [place]    Procedures    EKG Interpretation  Date/Time:  Tuesday August 31 2019 01:55:04 EST Ventricular Rate:  53 PR Interval:  186 QRS Duration: 94 QT Interval:  416 QTC Calculation: 390 R Axis:   60 Text Interpretation: Sinus bradycardia ST & T  wave abnormality, consider inferior ischemia ST & T wave abnormality, consider anterolateral ischemia Abnormal ECG No old tracing to compare Confirmed by Pryor Curia [phone]) on 08/31/2019 2:07:12 AM        MDM   Patient with suicidal ideation and depression.  She was found to have severe hypothyroidism with a significantly elevated TSH.  She will be started on Synthroid.  Patient is EKG does have some T wave abnormalities.  There is no old for consideration.  I discussed this with the patient.  She currently has no chest pain and has not had any chest pain in the last several weeks.  No dyspnea on exertion or leg swelling.  Suspect EKG changes are likely secondary to hypertension.  She has no history of same and is not currently treated.  Will monitor vital signs.  Discussed with Beverely Low of TTS who recommends inpatient.     Hypothyroidism, unspecified type  Suicidal ideation  Depression, unspecified depression type  Elevated TSH - 234.9      Agapito Games 08/31/19 8144    Ward, Delice Bison, DO 08/31/19 [phone]

## 2019-08-31 NOTE — Progress Notes (Signed)
Patient verbalized in group that this was her first psychiatric admission. More importantly, she mentioned that she felt guilty and shameful for being in the hospital. She did indicate that she was engaging more with her peers and was opening up more.

## 2019-09-01 DIAGNOSIS — F322 Major depressive disorder, single episode, severe without psychotic features: Principal | ICD-10-CM

## 2019-09-01 MED ORDER — LORAZEPAM 0.5 MG PO TABS: 0.5000 mg | ORAL_TABLET | Freq: Four times a day (QID) | ORAL | Status: DC | PRN

## 2019-09-01 MED ORDER — SERTRALINE HCL 100 MG PO TABS
100.0000 mg | ORAL_TABLET | Freq: Every day | ORAL | Status: DC
  Administered 2019-09-02 – 2019-09-04 (×3): 100 mg via ORAL
  Filled 2019-09-01 (×5): qty 1

## 2019-09-01 NOTE — Tx Team (Signed)
Interdisciplinary Treatment and Diagnostic Plan Update  09/01/2019 Time of Session: 9:00am Hannah Acosta MRN: [id number]  Principal Diagnosis: <principal problem not specified>  Secondary Diagnoses: Active Problems:   MDD (major depressive disorder), severe (HCC)   Current Medications:  Current Facility-Administered Medications  Medication Dose Route Frequency Provider Last Rate Last Dose  . ibuprofen (ADVIL) tablet 600 mg  600 mg Oral Q8H PRN Rankin, Shuvon B, NP   600 mg at 09/01/19 0528  . levothyroxine (SYNTHROID) tablet 200 mcg  200 mcg Oral Q0600 Rankin, Shuvon B, NP   200 mcg at 09/01/19 0528  . prenatal multivitamin tablet 1 tablet  1 tablet Oral Q1200 Rankin, Shuvon B, NP      . sertraline (ZOLOFT) tablet 75 mg  75 mg Oral Daily Rankin, Shuvon B, NP       PTA Medications: Medications Prior to Admission  Medication Sig Dispense Refill Last Dose  . ibuprofen (ADVIL) 600 MG tablet Take 1 tablet (600 mg total) by mouth every 8 (eight) hours as needed for mild pain or moderate pain. 30 tablet 0   . iron polysaccharides (NIFEREX) 150 MG capsule Take 1 capsule (150 mg total) by mouth daily. (Patient not taking: Reported on 08/30/2019) 45 capsule 0   . Prenatal Vit-Fe Fumarate-FA (PRENATAL MULTIVITAMIN) TABS tablet Take 1 tablet by mouth daily at 12 noon.     . sertraline (ZOLOFT) 50 MG tablet Take 75 mg by mouth daily.        Patient Stressors:    Patient Strengths:    Treatment Modalities: Medication Management, Group therapy, Case management,  1 to 1 session with clinician, Psychoeducation, Recreational therapy.   Physician Treatment Plan for Primary Diagnosis: <principal problem not specified> Long Term Goal(s):     Short Term Goals:    Medication Management: Evaluate patient's response, side effects, and tolerance of medication regimen.  Therapeutic Interventions: 1 to 1 sessions, Unit Group sessions and Medication administration.  Evaluation of Outcomes: Not  Met  Physician Treatment Plan for Secondary Diagnosis: Active Problems:   MDD (major depressive disorder), severe ([facility])  Long Term Goal(s):     Short Term Goals:       Medication Management: Evaluate patient's response, side effects, and tolerance of medication regimen.  Therapeutic Interventions: 1 to 1 sessions, Unit Group sessions and Medication administration.  Evaluation of Outcomes: Not Met   RN Treatment Plan for Primary Diagnosis: <principal problem not specified> Long Term Goal(s): Knowledge of disease and therapeutic regimen to maintain health will improve  Short Term Goals: Ability to verbalize feelings will improve, Ability to identify and develop effective coping behaviors will improve and Compliance with prescribed medications will improve  Medication Management: RN will administer medications as ordered by provider, will assess and evaluate patient's response and provide education to patient for prescribed medication. RN will report any adverse and/or side effects to prescribing provider.  Therapeutic Interventions: 1 on 1 counseling sessions, Psychoeducation, Medication administration, Evaluate responses to treatment, Monitor vital signs and CBGs as ordered, Perform/monitor CIWA, COWS, AIMS and Fall Risk screenings as ordered, Perform wound care treatments as ordered.  Evaluation of Outcomes: Not Met   LCSW Treatment Plan for Primary Diagnosis: <principal problem not specified> Long Term Goal(s): Safe transition to appropriate next level of care at discharge, Engage patient in therapeutic group addressing interpersonal concerns.  Short Term Goals: Engage patient in aftercare planning with referrals and resources, Increase social support, Increase emotional regulation, Identify triggers associated with mental health/substance abuse issues and  Increase skills for wellness and recovery  Therapeutic Interventions: Assess for all discharge needs, 1 to 1 time with Social  worker, Explore available resources and support systems, Assess for adequacy in community support network, Educate family and significant other(s) on suicide prevention, Complete Psychosocial Assessment, Interpersonal group therapy.  Evaluation of Outcomes: Not Met   Progress in Treatment: Attending groups: Yes. Participating in groups: Yes. Taking medication as prescribed: Yes. Toleration medication: Yes. Family/Significant other contact made: No, will contact:  supports if consents are granted. Patient understands diagnosis: Yes. Discussing patient identified problems/goals with staff: Yes. Medical problems stabilized or resolved: No. Denies suicidal/homicidal ideation: Yes. Issues/concerns per patient self-inventory: Yes.   New problem(s) identified: Yes, Describe:  post-partum, family stressors  New Short Term/Long Term Goal(s): medication management for mood stabilization; elimination of SI thoughts; development of comprehensive mental wellness/sobriety plan.  Patient Goals:  "Get rest, recognize anxiety and my triggers. Recognize and manage depression."  Discharge Plan or Barriers: Patient newly admitted to unit, CSW assessing for appropriate referrals. Patient expecting to return home at discharge and follow up with outpatient psychiatry and therapy.  Reason for Continuation of Hospitalization: Anxiety Depression Medication stabilization  Estimated Length of Stay: 3-5 days  Attendees: Patient: Hannah Acosta 09/01/2019 9:02 AM  Physician: Larene Beach 09/01/2019 9:02 AM  Nursing: Legrand Como, RN 09/01/2019 9:02 AM  RN Care Manager: 09/01/2019 9:02 AM  Social Worker: Stephanie Acre, [place] 09/01/2019 9:02 AM  Recreational Therapist:  09/01/2019 9:02 AM  Other: Harriett Sine, NP 09/01/2019 9:02 AM  Other:  09/01/2019 9:02 AM  Other: 09/01/2019 9:02 AM    Scribe for Treatment Team: Joellen Jersey, [place] 09/01/2019 9:02 AM

## 2019-09-01 NOTE — BHH Suicide Risk Assessment (Signed)
[facility] Admission Suicide Risk Assessment   Nursing information obtained from:  Patient Demographic factors:  Caucasian, Unemployed Current Mental Status:  Suicidal ideation indicated by patient, Self-harm thoughts Loss Factors:  NA Historical Factors:  Impulsivity Risk Reduction Factors:  Responsible for children under 31 years of age, Sense of responsibility to family, Living with another person, especially a relative  Total Time spent with patient: MDD  Principal Problem: <principal problem not specified> Diagnosis:  Active Problems:   MDD (major depressive disorder), severe (HCC)  Subjective Data:   Continued Clinical Symptoms:  Alcohol Use Disorder Identification Test Final Score (AUDIT): 0 The "Alcohol Use Disorders Identification Test", Guidelines for Use in Primary Care, Second Edition.  World Science writer [facility]). Score between 0-7:  no or low risk or alcohol related problems. Score between 8-15:  moderate risk of alcohol related problems. Score between 16-19:  high risk of alcohol related problems. Score 20 or above:  warrants further diagnostic evaluation for alcohol dependence and treatment.   CLINICAL FACTORS:  61, married, has two children ( 3, 6 months- children currently being taken care of by father and patient's mother), homemaker. Patient presented to hospital on 11/23 . She reports she had recently seen her Obstetrician as she felt tired, sluggish, depressed, with feelings of anxiety and hopelessness, subjectively decreased concentration. States " I felt  like I had PMS symptoms all the time". She is 5 + months post partum . Reports she was encouraged to come to ED. She reports she has been having some depressive symptoms since her pregnancy, but has felt more depressed over recent days to weeks. She has recently had suicidal ideations with thoughts of cutting wrist. Denies any psychotic symptoms. Reports she was recently diagnosed with Hypothyroidism. NKDA. Does  not smoke. Reports she had been breast feeding up to about a week ago. She takes Prenatal Vitamins  Home medications-  Reports she has been on Zoloft for months, including during her pregnancy. She was taking 50 mgrs daily , was  titrated to 75 mgrs QDAY several weeks ago.  Reports past history of anxiety, panic attacks in the past, even prior to pregnancies. No prior psychiatric admissions, no prior history of suicide attempts, remote history of self cutting when in middle school, denies history of psychosis, does not endorse history of PTSD. No history of mania or hypomania described  Denies alcohol or drug abuse. Admission BAL negative, UDS negative. Parents divorced when patient was 16, has one half sister.  Reports there is a history of depression and anxiety in extended family. Sister attempted suicide, no completed suicides in family. Dx- MDD, no psychotic features- post partum depression. Consider also Depression secondary to Hypothyroidism ( which was recently diagnosed ) .  Plan- inpatient admission. Was recently started on Synthroid at 200 micorgrams daily, tolerating thus far . We discussed options. Currently prefers to continue current treatment with Zoloft. She reports that in the past she had some arthritic pains which she felt might be related to Zoloft titration but currently denies any side effects at current dose .  Will increase Zoloft from  75 mgrs to 100 mgrs QDAY.     Musculoskeletal: Strength & Muscle Tone: within normal limits Gait & Station: normal Patient leans: N/A  Psychiatric Specialty Exam: Physical Exam  ROS reports headaches, no chest pain, no shortness of breath, no cough, denies any symptoms of mastitis, no nausea or vomiting, no rash  Blood pressure (!) 127/91, pulse 78, temperature (!) 97.4 F (36.3 C), temperature  source Oral, resp. rate 16, height 5\' 5"  (1.651 m), weight 124.7 kg, SpO2 99 %, unknown if currently breastfeeding.Body mass index is 45.76 kg/m.   General Appearance: Fairly Groomed  Eye Contact:  Good  Speech:  Normal Rate  Volume:  Decreased  Mood:  reports that today she is feeling better   Affect:  vaguely constricted but does smile at times appropriately  Thought Process:  Linear and Descriptions of Associations: Intact  Orientation:  Full (Time, Place, and Person)  Thought Content:  no hallucinations, no delusions, not internally preoccupied   Suicidal Thoughts:  No denies suicidal or self injurious ideations at this time , contracts for safety on unit, denies homicidal or violent ideations, and specifically also denies any violent ideations or HI towards her child.   Homicidal Thoughts:  No  Memory:  recent and remote grossly intact   Judgement:  Fair  Insight:  Fair  Psychomotor Activity:  Decreased  Concentration:  Concentration: Good and Attention Span: Good  Recall:  Good  Fund of Knowledge:  Good  Language:  Good  Akathisia:  Negative  Handed:  Right  AIMS (if indicated):     Assets:  Communication Skills Desire for Improvement Resilience  ADL's:  Intact  Cognition:  WNL  Sleep:  Number of Hours: 6.5      COGNITIVE FEATURES THAT CONTRIBUTE TO RISK:  Closed-mindedness and Loss of executive function    SUICIDE RISK:   Moderate:  Frequent suicidal ideation with limited intensity, and duration, some specificity in terms of plans, no associated intent, good self-control, limited dysphoria/symptomatology, some risk factors present, and identifiable protective factors, including available and accessible social support.  PLAN OF CARE:Patient will be admitted to inpatient psychiatric unit for stabilization and safety. Will provide and encourage milieu participation. Provide medication management and maked adjustments as needed.  Will follow daily.    I certify that inpatient services furnished can reasonably be expected to improve the patient's condition.   Jenne Campus, MD 09/01/2019, 12:57 PM

## 2019-09-01 NOTE — Progress Notes (Signed)
Adult Psychoeducational Group Note  Date:  09/01/2019 Time:  11:34 AM  Group Topic/Focus:  Identifying Needs:   The focus of this group is to help patients identify their personal needs that have been historically problematic and identify healthy behaviors to address their needs.  Participation Level:  Active  Participation Quality:  Appropriate  Affect:  Appropriate  Cognitive:  Alert  Insight: Good  Engagement in Group:  Engaged  Modes of Intervention:  Discussion and Education  Additional Comments:    Pt participated in group with the MHT. Today's topic of the day is personal development. During group the MHT discussed Maslow's hierarchy of needs. As group staff and pts discussed the different levels of the hierarchy and if they have or have not met the requirements to advance to to the next stage. Pt's discussed issues they have at each level. As a group it was discussed how to overcome issues to attempt to reach the top goal of self actualization. Pt discussed issues with with husband and trouble she has faced raising her child.   Lita Mains 09/01/2019, 11:34 AM

## 2019-09-01 NOTE — Plan of Care (Addendum)
Progress note  D: pt found in bed; compliant with medication administration. Pt denies any physical complaints or pain. Pt states they slept "ok". Pt states they thought they were visually hallucinating yesterday but notes this could be from the lack of sleep. Pt denies this occurring today or ever before. Pt denies si/hi/ah/vh and verbally agrees to approach staff if these become apparent or before harming themself/others while at [facility]. A: Pt provided support and encouragement. Pt given medication per protocol and standing orders. Q80m safety checks implemented and continued.  R: Pt safe on the unit. Will continue to monitor.  Pt progressing in the following metrics  Problem: Education: Goal: Ability to state activities that reduce stress will improve Outcome: Progressing   Problem: Coping: Goal: Ability to identify and develop effective coping behavior will improve Outcome: Progressing   Problem: Self-Concept: Goal: Ability to identify factors that promote anxiety will improve Outcome: Progressing Goal: Level of anxiety will decrease Outcome: Progressing

## 2019-09-01 NOTE — Plan of Care (Signed)
Patient remained calm and cooperative. Stayed in room reporting that she needed to rest. Cooperative and denying suicidal thoughts. Currently sleeping. Safety precautions maintained.

## 2019-09-01 NOTE — H&P (Addendum)
Psychiatric Admission Assessment Adult  Patient Identification: Hannah Acosta MRN:  [id number] Date of Evaluation:  09/01/2019 Chief Complaint:  mdd Principal Diagnosis: MDD (major depressive disorder), severe ([place]) Diagnosis:  Principal Problem:   MDD (major depressive disorder), severe ([place]) Active Problems:   Hypothyroidism  History of Present Illness:   Hannah Acosta is a 33 year-old caucasian female who presented to the emergency department on 08/30/2019 for an evaluation after being seen by her OB/GYN for depression, anxiety and SI. Patient has a history of gestational hypertension, anxiety, depression, and binge eating disorder. Reports that she had been feeling sluggish, tired, depressed, hopeless, and was having difficulty concentrating.States " I felt  like I had PMS symptoms all the time". She is 5 + months post partum .  She reports she has been having some depressive symptoms since her pregnancy, but has felt more depressed over recent days to weeks. She has recently had suicidal ideations with thoughts of cutting wrist. Denies any psychotic symptoms. Reports she was recently diagnosed with Hypothyroidism. NKDA. Does not smoke. Reports she had been breast feeding up to about a week ago. She takes Prenatal Vitamins. She is married, has two children ( 3 years, 6 months- children currently being taken care of by father and patient's mother), homemaker. During he ED visit her TSH level was found to be elevated at 222.3 and T4 was <0.25. Endocrinology was consulted and she was started on levothyroxine 200 mcg daily.   Associated Signs/Symptoms: Depression Symptoms:  depressed mood, anhedonia, psychomotor agitation, fatigue, feelings of worthlessness/guilt, difficulty concentrating, hopelessness, impaired memory, suicidal thoughts with specific plan, anxiety, loss of energy/fatigue, (Hypo) Manic Symptoms:  Irritable Mood, Anxiety Symptoms:  Excessive Worry, Psychotic  Symptoms:  Denies AVH, no paranoia, no delusions PTSD Symptoms: NA Total Time spent with patient: 45 minutes  Past Psychiatric History: Reports past history of anxiety, panic attacks in the past, even prior to pregnancies. No prior psychiatric admissions, no prior history of suicide attempts, remote history of self cutting when in middle school, denies history of psychosis, does not endorse history of PTSD. No history of mania or hypomania described  Denies alcohol or drug abuse. Admission BAL negative, UDS negative.  Is the patient at risk to self? Yes.    Has the patient been a risk to self in the past 6 months? No.  Has the patient been a risk to self within the distant past? No.  Is the patient a risk to others? No.  Has the patient been a risk to others in the past 6 months? No.  Has the patient been a risk to others within the distant past? No.   Prior Inpatient Therapy:   Prior Outpatient Therapy:    Alcohol Screening: 1. How often do you have a drink containing alcohol?: Never 2. How many drinks containing alcohol do you have on a typical day when you are drinking?: 1 or 2 3. How often do you have six or more drinks on one occasion?: Never AUDIT-C Score: 0 9. Have you or someone else been injured as a result of your drinking?: No 10. Has a relative or friend or a doctor or another health worker been concerned about your drinking or suggested you cut down?: No Alcohol Use Disorder Identification Test Final Score (AUDIT): 0 Alcohol Brief Interventions/Follow-up: AUDIT Score <7 follow-up not indicated Substance Abuse History in the last 12 months:  No. Consequences of Substance Abuse: NA Previous Psychotropic Medications: Yes  Reports she has been on  Zoloft for months, including during her pregnancy. She was taking 50 mgrs daily , was  titrated to 75 mgrs QDAY several weeks ago. Psychological Evaluations: No  Past Medical History:  Past Medical History:  Diagnosis Date  . Anemia    . Anxiety   . Depression   . Pregnancy induced hypertension     Past Surgical History:  Procedure Laterality Date  . CESAREAN SECTION N/A 08/18/2016   Procedure: CESAREAN SECTION;  Surgeon: Princess Bruins, MD;  Location: [facility];  Service: Obstetrics;  Laterality: N/A;  . CESAREAN SECTION N/A 03/07/2019   Procedure: CESAREAN SECTION;  Surgeon: Everett Graff, MD;  Location: [facility];  Service: Obstetrics;  Laterality: N/A;   Family History:  Family History  Problem Relation Age of Onset  . Alcohol abuse Mother   . Anxiety disorder Mother   . Hypertension Mother   . Alcohol abuse Father   . Cancer Maternal Grandmother   . Anxiety disorder Maternal Grandmother   . Hypertension Maternal Grandmother   . Anxiety disorder Sister   . Early death Maternal Uncle    Family Psychiatric  History: Reports there is a history of depression and anxiety in extended family. Sister attempted suicide, no completed suicides in family. Tobacco Screening: Have you used any form of tobacco in the last 30 days? (Cigarettes, Smokeless Tobacco, Cigars, and/or Pipes): No Social History:  Social History   Substance and Sexual Activity  Alcohol Use No     Social History   Substance and Sexual Activity  Drug Use No    Additional Social History:                           Allergies:  No Known Allergies Lab Results:  Results for orders placed or performed during the hospital encounter of 08/30/19 (from the past 48 hour(s))  Comprehensive metabolic panel     Status: Abnormal   Collection Time: 08/30/19  8:04 PM  Result Value Ref Range   Sodium 138 135 - 145 mmol/L   Potassium 3.8 3.5 - 5.1 mmol/L   Chloride 104 98 - 111 mmol/L   CO2 23 22 - 32 mmol/L   Glucose, Bld 88 70 - 99 mg/dL   BUN 19 6 - 20 mg/dL   Creatinine, Ser 1.05 (H) 0.44 - 1.00 mg/dL   Calcium 9.2 8.9 - 10.3 mg/dL   Total Protein 8.4 (H) 6.5 - 8.1 g/dL   Albumin 5.2 (H) 3.5 - 5.0 g/dL   AST 72 (H) 15 -  41 U/L   ALT 48 (H) 0 - 44 U/L   Alkaline Phosphatase 76 38 - 126 U/L   Total Bilirubin 0.7 0.3 - 1.2 mg/dL   GFR calc non Af Amer >60 >60 mL/min   GFR calc Af Amer >60 >60 mL/min   Anion gap 11 5 - 15    Comment: Performed at [facility], [place] [address] [place], [place] [address]  Ethanol     Status: None   Collection Time: 08/30/19  8:04 PM  Result Value Ref Range   Alcohol, Ethyl (B) <10 <10 mg/dL    Comment: (NOTE) Lowest detectable limit for serum alcohol is 10 mg/dL. For medical purposes only. Performed at [facility], [place] [address] [place], [place] [address]   Salicylate level     Status: None   Collection Time: 08/30/19  8:04 PM  Result Value Ref Range   Salicylate  Lvl <7.0 2.8 - 30.0 mg/dL    Comment: Performed at [facility], [place] [address] [place], [place] [address]  Acetaminophen level     Status: Abnormal   Collection Time: 08/30/19  8:04 PM  Result Value Ref Range   Acetaminophen (Tylenol), Serum <10 (L) 10 - 30 ug/mL    Comment: (NOTE) Therapeutic concentrations vary significantly. A range of 10-30 ug/mL  may be an effective concentration for many patients. However, some  are best treated at concentrations outside of this range. Acetaminophen concentrations >150 ug/mL at 4 hours after ingestion  and >50 ug/mL at 12 hours after ingestion are often associated with  toxic reactions. Performed at [facility], [place] [address], [place], [place] [address]   cbc     Status: None   Collection Time: 08/30/19  8:04 PM  Result Value Ref Range   WBC 9.4 4.0 - 10.5 K/uL   RBC 4.67 3.87 - 5.11 MIL/uL   Hemoglobin 13.6 12.0 - 15.0 g/dL   HCT 42.2 36.0 - 46.0 %   MCV 90.4 80.0 - 100.0 fL   MCH 29.1 26.0 - 34.0 pg   MCHC 32.2 30.0 - 36.0 g/dL   RDW 15.0 11.5 - 15.5 %   Platelets 263 150 - 400 K/uL   nRBC 0.0 0.0 - 0.2 %    Comment: Performed at [facility], [place]  [address] [place], [place] [address]  TSH     Status: Abnormal   Collection Time: 08/30/19  8:04 PM  Result Value Ref Range   TSH 234.971 (H) 0.350 - 4.500 uIU/mL    Comment: Performed by a 3rd Generation assay with a functional sensitivity of <=0.01 uIU/mL. Performed at [facility], [place] [address], [place], [place] [address]   I-Stat beta hCG blood, ED     Status: None   Collection Time: 08/30/19  8:08 PM  Result Value Ref Range   I-stat hCG, quantitative <5.0 <5 mIU/mL   Comment 3            Comment:   GEST. AGE      CONC.  (mIU/mL)   <=1 WEEK        5 - 50     2 WEEKS       50 - 500     3 WEEKS       100 - 10,000     4 WEEKS     1,000 - 30,000        FEMALE AND NON-PREGNANT FEMALE:     LESS THAN 5 mIU/mL   Rapid urine drug screen (hospital performed)     Status: None   Collection Time: 08/30/19  9:30 PM  Result Value Ref Range   Opiates NONE DETECTED NONE DETECTED   Cocaine NONE DETECTED NONE DETECTED   Benzodiazepines NONE DETECTED NONE DETECTED   Amphetamines NONE DETECTED NONE DETECTED   Tetrahydrocannabinol NONE DETECTED NONE DETECTED   Barbiturates NONE DETECTED NONE DETECTED    Comment: (NOTE) DRUG SCREEN FOR MEDICAL PURPOSES ONLY.  IF CONFIRMATION IS NEEDED FOR ANY PURPOSE, NOTIFY LAB WITHIN 5 DAYS. LOWEST DETECTABLE LIMITS FOR URINE DRUG SCREEN Drug Class                     Cutoff (ng/mL) Amphetamine and metabolites    1000 Barbiturate and metabolites    200 Benzodiazepine  700 Tricyclics and metabolites     300 Opiates and metabolites        300 Cocaine and metabolites        300 THC                            50 Performed at [facility], [place] [address] [place], [place] [address]   TSH     Status: Abnormal   Collection Time: 08/30/19 10:24 PM  Result Value Ref Range   TSH 222.307 (H) 0.350 - 4.500 uIU/mL    Comment: Performed by a 3rd Generation assay with a functional sensitivity of <=0.01  uIU/mL. Performed at [facility], [place] [address] [place], [place] [address]   T4, free     Status: Abnormal   Collection Time: 08/30/19 10:28 PM  Result Value Ref Range   Free T4 <0.25 (L) 0.61 - 1.12 ng/dL    Comment: Performed at [place] [address] [place], [place] [address]  SARS CORONAVIRUS 2 (TAT 6-24 HRS) Nasopharyngeal Nasopharyngeal Swab     Status: None   Collection Time: 08/30/19 10:29 PM   Specimen: Nasopharyngeal Swab  Result Value Ref Range   SARS Coronavirus 2 NEGATIVE NEGATIVE    Comment: (NOTE) SARS-CoV-2 target nucleic acids are NOT DETECTED. The SARS-CoV-2 RNA is generally detectable in upper and lower respiratory specimens during the acute phase of infection. Negative results do not preclude SARS-CoV-2 infection, do not rule out co-infections with other pathogens, and should not be used as the sole basis for treatment or other patient management decisions. Negative results must be combined with clinical observations, patient history, and epidemiological information. The expected result is Negative. Fact Sheet for Patients: SugarRoll.be Fact Sheet for Healthcare Providers: https://www.woods-mathews.com/ This test is not yet approved or cleared by the [place] FDA and  has been authorized for detection and/or diagnosis of SARS-CoV-2 by FDA under an Emergency Use Authorization (EUA). This EUA will remain  in effect (meaning this test can be used) for the duration of the COVID-19 declaration under Section 56 4(b)(1) of the Act, 21 U.S.C. section 360bbb-3(b)(1), unless the authorization is terminated or revoked sooner. Performed at [facility], [place] [address], [place], [place] [address]     Blood Alcohol level:  Lab Results  Component Value Date   ETH <10 93/57/0177    Metabolic Disorder Labs:  No results found for: HGBA1C, MPG No results found for: PROLACTIN No  results found for: CHOL, TRIG, HDL, CHOLHDL, VLDL, LDLCALC  Current Medications: Current Facility-Administered Medications  Medication Dose Route Frequency Provider Last Rate Last Dose  . ibuprofen (ADVIL) tablet 600 mg  600 mg Oral Q8H PRN Rankin, Shuvon B, NP   600 mg at 09/01/19 0528  . levothyroxine (SYNTHROID) tablet 200 mcg  200 mcg Oral Q0600 Rankin, Shuvon B, NP   200 mcg at 09/01/19 0528  . LORazepam (ATIVAN) tablet 0.5 mg  0.5 mg Oral Q6H PRN Oneisha Ammons, Myer Peer, MD      . prenatal multivitamin tablet 1 tablet  1 tablet Oral Q1200 Rankin, Shuvon B, NP   1 tablet at 09/01/19 1137  . [START ON 09/02/2019] sertraline (ZOLOFT) tablet 100 mg  100 mg Oral Daily Mirabelle Cyphers, Myer Peer, MD       PTA Medications: Medications Prior to Admission  Medication Sig Dispense Refill Last Dose  . ibuprofen (ADVIL) 600 MG tablet Take 1 tablet (  600 mg total) by mouth every 8 (eight) hours as needed for mild pain or moderate pain. 30 tablet 0   . iron polysaccharides (NIFEREX) 150 MG capsule Take 1 capsule (150 mg total) by mouth daily. (Patient not taking: Reported on 08/30/2019) 45 capsule 0   . Prenatal Vit-Fe Fumarate-FA (PRENATAL MULTIVITAMIN) TABS tablet Take 1 tablet by mouth daily at 12 noon.     . sertraline (ZOLOFT) 50 MG tablet Take 75 mg by mouth daily.        Musculoskeletal: Strength & Muscle Tone: within normal limits Gait & Station: normal Patient leans: N/A  Psychiatric Specialty Exam: Physical Exam  Constitutional: She is oriented to person, place, and time. She appears well-developed and well-nourished. No distress.  Respiratory: Effort normal. No respiratory distress.  Musculoskeletal: Normal range of motion.  Neurological: She is alert and oriented to person, place, and time.  Skin: She is not diaphoretic.    Review of Systems  Constitutional: Negative.   Eyes: Negative.   Respiratory: Negative.   Cardiovascular: Negative.   Gastrointestinal: Negative.   Genitourinary:  Negative.   Skin: Negative.   Neurological: Positive for headaches.    Blood pressure (!) 127/91, pulse 78, temperature (!) 97.4 F (36.3 C), temperature source Oral, resp. rate 16, height 5' 5"  (1.651 m), weight 124.7 kg, SpO2 99 %, unknown if currently breastfeeding.Body mass index is 45.76 kg/m.  General Appearance: Well Groomed  Eye Contact:  Good  Speech:  Clear and Coherent and Normal Rate  Volume:  Decreased  Mood:  Depressed and reports feelins some better  Affect:  Constricted, Depressed and Tearful  Thought Process:  Coherent, Goal Directed, Linear and Descriptions of Associations: Intact  Orientation:  Full (Time, Place, and Person)  Thought Content:  no hallucinations, no delusions, not internally preoccupied   Suicidal Thoughts:  denies suicidal or self injurious ideations at this time , contracts for safety on unit, denies homicidal or violent ideations, and specifically also denies any violent ideations or HI towards her child.   Homicidal Thoughts:  No  Memory:  Immediate;   Good Recent;   Good Remote;   Good  Judgement:  Fair  Insight:  Fair  Psychomotor Activity:  Decreased  Concentration:  Concentration: Good and Attention Span: Good  Recall:  Good  Fund of Knowledge:  Good  Language:  Good  Akathisia:  Negative  Handed:  Right  AIMS (if indicated):     Assets:  Communication Skills Desire for Improvement Financial Resources/Insurance Housing Intimacy Leisure Time Physical Health Transportation  ADL's:  Intact  Cognition:  WNL  Sleep:  Number of Hours: 6.5    Treatment Plan Summary: Daily contact with patient to assess and evaluate symptoms and progress in treatment and Medication management  Observation Level/Precautions:  15 minute checks  Psychotherapy:  Group  Medications:  We discussed options. Currently prefers to continue current treatment with Zoloft. She reports that in the past she had some arthritic pains which she felt might be related  to Zoloft titration but currently denies any side effects at current dose .  Will increase Zoloft from  75 mgrs to 100 mgrs QDAY  Consultations:  As needed  Discharge Concerns:  safety  Estimated LOS: 3-4 days  Other:     Physician Treatment Plan for Primary Diagnosis: MDD (major depressive disorder), severe ([place]) Long Term Goal(s): Improvement in symptoms so as ready for discharge  Short Term Goals: Ability to identify changes in lifestyle to reduce recurrence of condition  will improve, Ability to verbalize feelings will improve, Ability to disclose and discuss suicidal ideas, Ability to demonstrate self-control will improve, Ability to identify and develop effective coping behaviors will improve, Ability to maintain clinical measurements within normal limits will improve and Ability to identify triggers associated with substance abuse/mental health issues will improve  Physician Treatment Plan for Secondary Diagnosis: Active Problems:   MDD (major depressive disorder), severe ([employer])  Long Term Goal(s): Improvement in symptoms so as ready for discharge  Short Term Goals: Ability to identify changes in lifestyle to reduce recurrence of condition will improve, Ability to verbalize feelings will improve, Ability to disclose and discuss suicidal ideas, Ability to demonstrate self-control will improve, Ability to identify and develop effective coping behaviors will improve, Ability to maintain clinical measurements within normal limits will improve and Ability to identify triggers associated with substance abuse/mental health issues will improve  I certify that inpatient services furnished can reasonably be expected to improve the patient's condition.    Rozetta Nunnery, NP 11/25/20201:34 PM  I have discussed case with NP and have met with patient  Agree with NP note and assessment  33, married, has two children ( 3, 6 months- children currently being taken care of by father and patient's mother),  homemaker. Patient presented to hospital on 11/23 . She reports she had recently seen her Obstetrician as she felt tired, sluggish, depressed, with feelings of anxiety and hopelessness, subjectively decreased concentration. States " I felt  like I had PMS symptoms all the time". She is 5 + months post partum . Reports she was encouraged to come to ED. She reports she has been having some depressive symptoms since her pregnancy, but has felt more depressed over recent days to weeks. She has recently had suicidal ideations with thoughts of cutting wrist. Denies any psychotic symptoms. Reports she was recently diagnosed with Hypothyroidism. NKDA. Does not smoke. Reports she had been breast feeding up to about a week ago. She takes Prenatal Vitamins  Home medications-  Reports she has been on Zoloft for months, including during her pregnancy. She was taking 50 mgrs daily , was  titrated to 75 mgrs QDAY several weeks ago.  Reports past history of anxiety, panic attacks in the past, even prior to pregnancies. No prior psychiatric admissions, no prior history of suicide attempts, remote history of self cutting when in middle school, denies history of psychosis, does not endorse history of PTSD. No history of mania or hypomania described  Denies alcohol or drug abuse. Admission BAL negative, UDS negative. Parents divorced when patient was 43, has one half sister.  Reports there is a history of depression and anxiety in extended family. Sister attempted suicide, no completed suicides in family. Dx- MDD, no psychotic features- post partum depression. Consider also Depression secondary to Hypothyroidism ( which was recently diagnosed ) .  Plan- inpatient admission. Was recently started on Synthroid at 200 micorgrams daily, tolerating thus far . We discussed options. Currently prefers to continue current treatment with Zoloft. She reports that in the past she had some arthritic pains which she felt might be related  to Zoloft titration but currently denies any side effects at current dose .  Will increase Zoloft from  75 mgrs to 100 mgrs QDAY.

## 2019-09-02 NOTE — [facility] Group Notes (Signed)
[facility] Group Notes:  (Nursing/MHT/Case Management/Adjunct)  Date:  09/02/2019  Time:  8:59 PM  Type of Therapy:  Wrap Up Group  Participation Level:  Active  Participation Quality:  Appropriate  Affect:  Appropriate  Cognitive:  Appropriate  Insight:  Appropriate  Engagement in Group:  Supportive  Modes of Intervention:  Discussion  Summary of Progress/Problems:  Patient reports being "Thankful that I'm here even though it's Thanksgiving.  I had a fantastic day yesterday.  I've been sluggish today because they upped my antidepressant.  I'm thankful for the people here."  Scherrie November 09/02/2019, 8:59 PM

## 2019-09-02 NOTE — Progress Notes (Signed)
   09/01/19 2243  Psych Admission Type (Psych Patients Only)  Admission Status Voluntary  Psychosocial Assessment  Patient Complaints None  Eye Contact Fair  Facial Expression Pensive;Sullen;Sad  Affect Depressed;Preoccupied;Sad;Sullen  Speech Logical/coherent  Interaction Assertive  Motor Activity Slow  Appearance/Hygiene Unremarkable  Behavior Characteristics Cooperative  Mood Pleasant  Thought Process  Coherency WDL  Content WDL  Delusions None reported or observed  Perception WDL  Hallucination None reported or observed  Judgment Poor  Confusion None  Danger to Self  Current suicidal ideation? Denies  Self-Injurious Behavior No self-injurious ideation or behavior indicators observed or expressed   Agreement Not to Harm Self Yes  Description of Agreement verbalizes   Danger to Others  Danger to Others None reported or observed

## 2019-09-02 NOTE — Progress Notes (Signed)
[place] NOVEL CORONAVIRUS (COVID-19) DAILY CHECK-OFF SYMPTOMS - answer yes or no to each - every day NO YES  Have you had a fever in the past 24 hours?  . Fever (Temp > 37.80C / 100F) X   Have you had any of these symptoms in the past 24 hours? . New Cough .  Sore Throat  .  Shortness of Breath .  Difficulty Breathing .  Unexplained Body Aches   X   Have you had any one of these symptoms in the past 24 hours not related to allergies?   . Runny Nose .  Nasal Congestion .  Sneezing   X   If you have had runny nose, nasal congestion, sneezing in the past 24 hours, has it worsened?  X   EXPOSURES - check yes or no X   Have you traveled outside the state in the past 14 days?  X   Have you been in contact with someone with a confirmed diagnosis of COVID-19 or PUI in the past 14 days without wearing appropriate PPE?  X   Have you been living in the same home as a person with confirmed diagnosis of COVID-19 or a PUI (household contact)?    X   Have you been diagnosed with COVID-19?    X              What to do next: Answered NO to all: Answered YES to anything:   Proceed with unit schedule Follow the [facility] Inpatient Flowsheet.   

## 2019-09-02 NOTE — Progress Notes (Signed)
[facility] MD Progress Note  09/02/2019 3:39 PM Hannah Acosta  MRN:  [id number] Subjective: Patient is reporting partial improvement compared to admission.  Currently denies medication side effect.  Denies suicidal ideations. Objective: I reviewed chart notes and met with patient. 33 year old married female, 2 children ages 30 years old and 32 months old, presented to hospital on 11/23 at encouragement of her obstetrician, whom she had consulted due to feeling tired, sluggish, depressed, anxious, with recent self-injurious ideations of cutting her wrist.  Of note she was recently diagnosed with hypothyroidism with an elevated TSH at 222.3 on 11/23. She reports history of anxiety and depression for which she has been on Zoloft.  She had taken Zoloft during her pregnancy and since delivery 5 months ago.  Patient reports some improvement compared to admission.  Presents vaguely depressed with a constricted affect although tends to improve during session and smiles at times appropriately.  Today denies suicidal ideations or any self-injurious thoughts.  Of note has denied any violent or homicidal ideations towards family or child and states that she feels her maternal bond to her child has been strong and "normal". At present denies medication side effects-she is continued on Zoloft/dose was increased to 100 mg daily.  Visible in milieu, no disruptive or agitated behaviors. Principal Problem: MDD (major depressive disorder), severe ([place]) Diagnosis: Principal Problem:   MDD (major depressive disorder), severe ([facility]) Active Problems:   Hypothyroidism  Total Time spent with patient: 20 minutes  Past Psychiatric History:   Past Medical History:  Past Medical History:  Diagnosis Date  . Anemia   . Anxiety   . Depression   . Pregnancy induced hypertension     Past Surgical History:  Procedure Laterality Date  . CESAREAN SECTION N/A 08/18/2016   Procedure: CESAREAN SECTION;  Surgeon: Princess Bruins,  MD;  Location: [facility];  Service: Obstetrics;  Laterality: N/A;  . CESAREAN SECTION N/A 03/07/2019   Procedure: CESAREAN SECTION;  Surgeon: Everett Graff, MD;  Location: [facility];  Service: Obstetrics;  Laterality: N/A;   Family History:  Family History  Problem Relation Age of Onset  . Alcohol abuse Mother   . Anxiety disorder Mother   . Hypertension Mother   . Alcohol abuse Father   . Cancer Maternal Grandmother   . Anxiety disorder Maternal Grandmother   . Hypertension Maternal Grandmother   . Anxiety disorder Sister   . Early death Maternal Uncle    Family Psychiatric  History:  Social History:  Social History   Substance and Sexual Activity  Alcohol Use No     Social History   Substance and Sexual Activity  Drug Use No    Social History   Socioeconomic History  . Marital status: Married    Spouse name: Markus Daft  . Number of children: Not on file  . Years of education: Not on file  . Highest education level: Not on file  Occupational History  . Not on file  Social Needs  . Financial resource strain: Not hard at all  . Food insecurity    Worry: Never true    Inability: Never true  . Transportation needs    Medical: No    Non-medical: No  Tobacco Use  . Smoking status: Never Smoker  . Smokeless tobacco: Never Used  Substance and Sexual Activity  . Alcohol use: No  . Drug use: No  . Sexual activity: Yes    Birth control/protection: None  Lifestyle  . Physical activity  Days per week: Patient refused    Minutes per session: Patient refused  . Stress: To some extent  Relationships  . Social Herbalist on phone: Once a week    Gets together: Once a week    Attends religious service: Patient refused    Active member of club or organization: Patient refused    Attends meetings of clubs or organizations: Patient refused    Relationship status: Patient refused  Other Topics Concern  . Not on file  Social History Narrative  . Not  on file   Additional Social History:   Sleep: Good  Appetite:  Good  Current Medications: Current Facility-Administered Medications  Medication Dose Route Frequency Provider Last Rate Last Dose  . ibuprofen (ADVIL) tablet 600 mg  600 mg Oral Q8H PRN Rankin, Shuvon B, NP   600 mg at 09/01/19 2108  . levothyroxine (SYNTHROID) tablet 200 mcg  200 mcg Oral Q0600 Rankin, Shuvon B, NP   200 mcg at 09/02/19 0932  . LORazepam (ATIVAN) tablet 0.5 mg  0.5 mg Oral Q6H PRN Valerie Cones, Myer Peer, MD      . prenatal multivitamin tablet 1 tablet  1 tablet Oral Q1200 Rankin, Shuvon B, NP   1 tablet at 09/02/19 1213  . sertraline (ZOLOFT) tablet 100 mg  100 mg Oral Daily Patsie Mccardle, Myer Peer, MD   100 mg at 09/02/19 6712    Lab Results: No results found for this or any previous visit (from the past 48 hour(s)).  Blood Alcohol level:  Lab Results  Component Value Date   ETH <10 45/80/9983    Metabolic Disorder Labs: No results found for: HGBA1C, MPG No results found for: PROLACTIN No results found for: CHOL, TRIG, HDL, CHOLHDL, VLDL, LDLCALC  Physical Findings: AIMS: Facial and Oral Movements Muscles of Facial Expression: None, normal Lips and Perioral Area: None, normal Jaw: None, normal Tongue: None, normal,Extremity Movements Upper (arms, wrists, hands, fingers): None, normal Lower (legs, knees, ankles, toes): None, normal, Trunk Movements Neck, shoulders, hips: None, normal, Overall Severity Severity of abnormal movements (highest score from questions above): None, normal Incapacitation due to abnormal movements: None, normal Patient's awareness of abnormal movements (rate only patient's report): No Awareness, Dental Status Current problems with teeth and/or dentures?: No Does patient usually wear dentures?: No  CIWA:    COWS:     Musculoskeletal: Strength & Muscle Tone: within normal limits Gait & Station: normal Patient leans: N/A  Psychiatric Specialty Exam: Physical Exam  ROS  denies chest pain, no shortness of breath, no cough no vomiting, no fever, no chills  Blood pressure (!) 118/99, pulse 83, temperature (!) 97.1 F (36.2 C), temperature source Oral, resp. rate 16, height 5' 5"  (1.651 m), weight 124.7 kg, SpO2 99 %, unknown if currently breastfeeding.Body mass index is 45.76 kg/m.  General Appearance: Well Groomed  Eye Contact:  Good  Speech:  Normal Rate  Volume:  Normal  Mood:  Improving  Affect:  Constricted, improves partially during session, smiles at times appropriately during session  Thought Process:  Linear and Descriptions of Associations: Intact  Orientation:  Other:  Fully alert and attentive  Thought Content:  No hallucinations, no delusions  Suicidal Thoughts:  No denies suicidal or self-injurious ideations, denies any homicidal or violent ideations, specifically also denies any violent thoughts towards her child  Homicidal Thoughts:  No  Memory:  Recent and remote grossly intact  Judgement: Improving  Insight:  Fair/improving  Psychomotor Activity:  Normal-no psychomotor  agitation or restlessness  Concentration:  Concentration: Good and Attention Span: Good  Recall:  Good  Fund of Knowledge:  Good  Language:  Good  Akathisia:  Negative  Handed:  Right  AIMS (if indicated):     Assets:  Communication Skills Desire for Improvement Resilience  ADL's:  Intact  Cognition:  WNL  Sleep:  Number of Hours: 6.75   Assessment:  33 year old married female, 2 children ages 9 years old and 74 months old, presented to hospital on 11/23 at encouragement of her obstetrician, whom she had consulted due to feeling tired, sluggish, depressed, anxious, with recent self-injurious ideations of cutting her wrist.  Of note she was recently diagnosed with hypothyroidism with an elevated TSH at 222.3 on 11/23. She reports history of anxiety and depression for which she has been on Zoloft.  She had taken Zoloft during her pregnancy and since delivery 5 months  ago.   Currently patient presents with some improvement compared to admission.  Does continue to present with a vaguely constricted/sad affect although improves during session and does smile at times appropriately.  Denies SI.  Presents with postpartum depression but reports prior history of depression and anxiety for which she had been treated with Zoloft.  She was also recently diagnosed with hypothyroidism.  Currently tolerating Zoloft titration well without side effects thus far.  Treatment Plan Summary: Daily contact with patient to assess and evaluate symptoms and progress in treatment, Medication management, Plan Inpatient treatment and Medications as below Encourage group and milieu participation Continue Zoloft 100 mg daily for depression/anxiety Continue Synthroid 200 mcg for hypothyroidism Continue Ativan 0.5 mg every 6 hours as needed for anxiety Continue prenatal vitamin as supplement. Treatment team working on disposition planning options.  Jenne Campus, MD 09/02/2019, 3:39 PM

## 2019-09-02 NOTE — Progress Notes (Signed)
D. Pt presents as flat, but brightens during interactions- per pt's self inventory, pt rated her depression, hopelessness and anxiety a 2/0/2, respectively. Pt currently denies SI/HI and AVH   A. Labs and vitals monitored. Pt compliant with medications. Pt supported emotionally and encouraged to express concerns and ask questions.   R. Pt remains safe with 15 minute checks. Will continue POC.

## 2019-09-03 NOTE — Progress Notes (Signed)
   09/03/19 2200  Psych Admission Type (Psych Patients Only)  Admission Status Voluntary  Psychosocial Assessment  Patient Complaints Anxiety  Eye Contact Fair  Facial Expression Animated;Pensive  Affect Anxious;Preoccupied  Speech Logical/coherent  Interaction Assertive  Motor Activity Slow  Appearance/Hygiene Unremarkable  Behavior Characteristics Cooperative  Mood Anxious  Thought Process  Coherency WDL  Content WDL  Delusions None reported or observed  Perception WDL  Hallucination None reported or observed  Judgment Poor  Confusion None  Danger to Self  Current suicidal ideation? Denies  Self-Injurious Behavior No self-injurious ideation or behavior indicators observed or expressed   Agreement Not to Harm Self Yes  Description of Agreement verbalizes   Danger to Others  Danger to Others None reported or observed   Pt stated she was better , still having issues with her short term memory and concentration.

## 2019-09-03 NOTE — Progress Notes (Signed)
Recreation Therapy Notes  Date:  11.27.20 Time: 0930 Location: [address] Dayroom  Group Topic: Stress Management  Goal Area(s) Addresses:  Patient will identify positive stress management techniques. Patient will identify benefits of using stress management post d/c.  Behavioral Response:  Engaged  Intervention: Stress Management  Activity :  Progressive Muscle Relaxation.  LRT introduced the stress management technique of progressive muscle relaxation.  LRT read a script that guided patients in tensing then relaxing each muscle group.  Patients were to follow along as script was read to engage in activity.  Education:  Stress Management, Discharge Planning.   Education Outcome: Acknowledges Education  Clinical Observations/Feedback: Pt attended and participated in activity.     Victorino Sparrow, LRT/CTRS         Victorino Sparrow A 09/03/2019 11:21 AM

## 2019-09-03 NOTE — Progress Notes (Signed)
   09/03/19 0031  Psych Admission Type (Psych Patients Only)  Admission Status Voluntary  Psychosocial Assessment  Patient Complaints None  Eye Contact Fair  Facial Expression Pensive;Sullen;Sad  Affect Depressed;Preoccupied;Sad;Sullen  Speech Logical/coherent  Interaction Assertive  Motor Activity Slow  Appearance/Hygiene Unremarkable  Behavior Characteristics Cooperative  Mood Depressed  Thought Process  Coherency WDL  Content WDL  Delusions None reported or observed  Perception WDL  Hallucination None reported or observed  Judgment Poor  Confusion None  Danger to Self  Current suicidal ideation? Denies  Self-Injurious Behavior No self-injurious ideation or behavior indicators observed or expressed   Agreement Not to Harm Self Yes  Description of Agreement verbalizes   Danger to Others  Danger to Others None reported or observed  D: Patient in dayroom reports she had a good day interacting with peers. Pt reports she misses her children but enjoy the adult conversations.  A: Medications administered as prescribed. Support and encouragement provided as needed.  R: Patient remains safe on the unit. Will continue to monitor for safety and stability.

## 2019-09-03 NOTE — Plan of Care (Signed)
Progress note  D: pt found in bed; compliant with medication administration. Pt is pleasant. Pt states they feel the extra dosage of their antidepressant caused drowsiness yesterday. Pt isn't concerned, and wants to continue on the prescribed regimen. Pt denies any physical complaints or pain. Pt denies si/hi/ah/vh and verbally agrees to approach staff if these become apparent or before harming themself/others while at [facility].  A: Pt provided support and encouragement. Pt given medication per protocol and standing orders. Q53m safety checks implemented and continued.  R: Pt safe on the unit. Will continue to monitor.  Pt progressing in the following metrics  Problem: Education: Goal: Knowledge of the prescribed therapeutic regimen will improve Outcome: Progressing   Problem: Activity: Goal: Interest or engagement in leisure activities will improve Outcome: Progressing Goal: Imbalance in normal sleep/wake cycle will improve Outcome: Progressing   Problem: Coping: Goal: Coping ability will improve Outcome: Progressing
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
## 2019-09-03 NOTE — Progress Notes (Signed)
[facility] MD Progress Note  09/03/2019 2:59 PM Hannah Acosta  MRN:  [id number] Subjective:  Patient reports she is feeling better than on admission. States yesterday felt tired /sedated after taking increased Zoloft dose, but that today did not experience any sedation or side effects after taking it. Denies suicidal ideations.  Objective: I reviewed chart notes and met with patient. 33 year old married female, 2 children ages 36 years old and 18 months old, presented to hospital on 11/23 at encouragement of her obstetrician, whom she had consulted due to feeling tired, sluggish, depressed, anxious, with recent self-injurious ideations of cutting her wrist.  Of note she was recently diagnosed with hypothyroidism with an elevated TSH at 222.3 on 11/23. She reports history of anxiety and depression for which she has been on Zoloft.  She had taken Zoloft during her pregnancy and since delivery 5 months ago.  Presents calm, pleasant on approach, endorses improving mood . Also reports feeling less tired and less sluggish, which she had reported on admission and which I suspect was at least partly due to hypothyroidism, which was recently diagnosed . Denies SI. Denies medication side effects. Visible in day room and interacting appropriately with peers.   Principal Problem: MDD (major depressive disorder), severe ([place]) Diagnosis: Principal Problem:   MDD (major depressive disorder), severe ([facility]) Active Problems:   Hypothyroidism  Total Time spent with patient: 15 minutes  Past Psychiatric History:   Past Medical History:  Past Medical History:  Diagnosis Date  . Anemia   . Anxiety   . Depression   . Pregnancy induced hypertension     Past Surgical History:  Procedure Laterality Date  . CESAREAN SECTION N/A 08/18/2016   Procedure: CESAREAN SECTION;  Surgeon: Princess Bruins, MD;  Location: [place];  Service: Obstetrics;  Laterality: N/A;  . CESAREAN SECTION N/A 03/07/2019   Procedure: CESAREAN SECTION;  Surgeon: Everett Graff, MD;  Location: [place] LD [facility]S;  Service: Obstetrics;  Laterality: N/A;   Family History:  Family History  Problem Relation Age of Onset  . Alcohol abuse Mother   . Anxiety disorder Mother   . Hypertension Mother   . Alcohol abuse Father   . Cancer Maternal Grandmother   . Anxiety disorder Maternal Grandmother   . Hypertension Maternal Grandmother   . Anxiety disorder Sister   . Early death Maternal Uncle    Family Psychiatric  History:  Social History:  Social History   Substance and Sexual Activity  Alcohol Use No     Social History   Substance and Sexual Activity  Drug Use No    Social History   Socioeconomic History  . Marital status: Married    Spouse name: Markus Daft  . Number of children: Not on file  . Years of education: Not on file  . Highest education level: Not on file  Occupational History  . Not on file  Social Needs  . Financial resource strain: Not hard at all  . Food insecurity    Worry: Never true    Inability: Never true  . Transportation needs    Medical: No    Non-medical: No  Tobacco Use  . Smoking status: Never Smoker  . Smokeless tobacco: Never Used  Substance and Sexual Activity  . Alcohol use: No  . Drug use: No  . Sexual activity: Yes    Birth control/protection: None  Lifestyle  . Physical activity    Days per week: Patient refused    Minutes per session: Patient refused  .  Stress: To some extent  Relationships  . Social Herbalist on phone: Once a week    Gets together: Once a week    Attends religious service: Patient refused    Active member of club or organization: Patient refused    Attends meetings of clubs or organizations: Patient refused    Relationship status: Patient refused  Other Topics Concern  . Not on file  Social History Narrative  . Not on file   Additional Social History:   Sleep: Good  Appetite:  Good  Current Medications: Current  Facility-Administered Medications  Medication Dose Route Frequency Provider Last Rate Last Dose  . ibuprofen (ADVIL) tablet 600 mg  600 mg Oral Q8H PRN Rankin, Shuvon B, NP   600 mg at 09/02/19 2214  . levothyroxine (SYNTHROID) tablet 200 mcg  200 mcg Oral Q0600 Rankin, Shuvon B, NP   200 mcg at 09/03/19 0718  . LORazepam (ATIVAN) tablet 0.5 mg  0.5 mg Oral Q6H PRN Vessie Olmsted, Myer Peer, MD      . prenatal multivitamin tablet 1 tablet  1 tablet Oral Q1200 Rankin, Shuvon B, NP   1 tablet at 09/03/19 1154  . sertraline (ZOLOFT) tablet 100 mg  100 mg Oral Daily Jakwan Sally, Myer Peer, MD   100 mg at 09/03/19 6160    Lab Results: No results found for this or any previous visit (from the past 48 hour(s)).  Blood Alcohol level:  Lab Results  Component Value Date   ETH <10 73/71/0626    Metabolic Disorder Labs: No results found for: HGBA1C, MPG No results found for: PROLACTIN No results found for: CHOL, TRIG, HDL, CHOLHDL, VLDL, LDLCALC  Physical Findings: AIMS: Facial and Oral Movements Muscles of Facial Expression: None, normal Lips and Perioral Area: None, normal Jaw: None, normal Tongue: None, normal,Extremity Movements Upper (arms, wrists, hands, fingers): None, normal Lower (legs, knees, ankles, toes): None, normal, Trunk Movements Neck, shoulders, hips: None, normal, Overall Severity Severity of abnormal movements (highest score from questions above): None, normal Incapacitation due to abnormal movements: None, normal Patient's awareness of abnormal movements (rate only patient's report): No Awareness, Dental Status Current problems with teeth and/or dentures?: No Does patient usually wear dentures?: No  CIWA:    COWS:     Musculoskeletal: Strength & Muscle Tone: within normal limits Gait & Station: normal Patient leans: N/A  Psychiatric Specialty Exam: Physical Exam  ROS denies chest pain, no shortness of breath, no cough no vomiting, no fever, no chills  Blood pressure 102/89,  pulse 90, temperature (!) 97.1 F (36.2 C), temperature source Oral, resp. rate 20, height 5' 5"  (1.651 m), weight 124.7 kg, SpO2 99 %, unknown if currently breastfeeding.Body mass index is 45.76 kg/m.  General Appearance: Well Groomed  Eye Contact:  Good  Speech:  Normal Rate  Volume:  Normal  Mood:  improving compared to admission, states she is feeling better  Affect:  becoming more reactive  Thought Process:  Linear and Descriptions of Associations: Intact  Orientation:  Other:  Fully alert and attentive  Thought Content:  No hallucinations, no delusions  Suicidal Thoughts:  No denies suicidal or self-injurious ideations, denies any homicidal or violent ideations, specifically also denies any violent thoughts towards her child  Homicidal Thoughts:  No  Memory:  Recent and remote grossly intact  Judgement: Improving  Insight:  Fair/improving  Psychomotor Activity:  Normal-no psychomotor agitation or restlessness  Concentration:  Concentration: Good and Attention Span: Good  Recall:  Good  Fund of Knowledge:  Good  Language:  Good  Akathisia:  Negative  Handed:  Right  AIMS (if indicated):     Assets:  Communication Skills Desire for Improvement Resilience  ADL's:  Intact  Cognition:  WNL  Sleep:  Number of Hours: 6.75   Assessment:  33 year old married female, 2 children ages 49 years old and 74 months old, presented to hospital on 11/23 at encouragement of her obstetrician, whom she had consulted due to feeling tired, sluggish, depressed, anxious, with recent self-injurious ideations of cutting her wrist.  Of note she was recently diagnosed with hypothyroidism with an elevated TSH at 222.3 on 11/23. She reports history of anxiety and depression for which she has been on Zoloft.  She had taken Zoloft during her pregnancy and since delivery 5 months ago.   Patient presents with gradually improving mood, fuller range of affect. Denies SI. Tolerating Zoloft , now titrated to 100  mgrs QDAY, well . Reports yesterday felt sedated after taking increased dose, but not today. She was recently diagnosed with Hypothyroidism, which was likely contributing to feelings of " sluggishness" and low energy level. These symptoms are now improving as well .    Treatment Plan Summary: Daily contact with patient to assess and evaluate symptoms and progress in treatment, Medication management, Plan Inpatient treatment and Medications as below  Treatment plan reviewed as below today 11/27 Encourage group and milieu participation Continue Zoloft 100 mg daily for depression/anxiety Continue Synthroid 200 mcg for hypothyroidism Continue Ativan 0.5 mg every 6 hours as needed for anxiety Continue prenatal vitamin as supplement. Treatment team working on disposition planning options.  Jenne Campus, MD 09/03/2019, 2:59 PM   Patient ID: Buddy Duty, female   DOB: 1986-08-06, 33 y.o.   MRN: [id number]

## 2019-09-04 MED ORDER — LEVOTHYROXINE SODIUM 200 MCG PO TABS: 200.0000 ug | ORAL_TABLET | Freq: Every day | ORAL | 0 refills | Status: DC

## 2019-09-04 MED ORDER — SERTRALINE HCL 100 MG PO TABS: 100.0000 mg | ORAL_TABLET | Freq: Every day | ORAL | 0 refills | Status: DC

## 2019-09-04 NOTE — Discharge Summary (Addendum)
Physician Discharge Summary Note  Patient:  Hannah Acosta is an 33 y.o., female MRN:  [id number] DOB:  09/27/1986 Patient phone:  [phone] (home)  Patient address:   [place] [address],  Total Time spent with patient: 15 minutes  Date of Admission:  08/31/2019 Date of Discharge: 09/04/19  Reason for Admission:  suicidal ideation  Principal Problem: MDD (major depressive disorder), severe ([place]) Discharge Diagnoses: Principal Problem:   MDD (major depressive disorder), severe ([place]) Active Problems:   Hypothyroidism   Past Psychiatric History: Reports past history of anxiety, panic attacks in the past, even prior to pregnancies. No prior psychiatric admissions, no prior history of suicide attempts, remote history of self cutting when in middle school, denies history of psychosis, does not endorse history of PTSD. No history of mania or hypomania described   Past Medical History:  Past Medical History:  Diagnosis Date  . Anemia   . Anxiety   . Depression   . Pregnancy induced hypertension     Past Surgical History:  Procedure Laterality Date  . CESAREAN SECTION N/A 08/18/2016   Procedure: CESAREAN SECTION;  Surgeon: Princess Bruins, MD;  Location: [place];  Service: Obstetrics;  Laterality: N/A;  . CESAREAN SECTION N/A 03/07/2019   Procedure: CESAREAN SECTION;  Surgeon: Everett Graff, MD;  Location: [place] LD ORS;  Service: Obstetrics;  Laterality: N/A;   Family History:  Family History  Problem Relation Age of Onset  . Alcohol abuse Mother   . Anxiety disorder Mother   . Hypertension Mother   . Alcohol abuse Father   . Cancer Maternal Grandmother   . Anxiety disorder Maternal Grandmother   . Hypertension Maternal Grandmother   . Anxiety disorder Sister   . Early death Maternal Uncle    Family Psychiatric  History: Reports there is a history of depression and anxiety in extended family. Sister attempted suicide, no completed suicides in  family. Social History:  Social History   Substance and Sexual Activity  Alcohol Use No     Social History   Substance and Sexual Activity  Drug Use No    Social History   Socioeconomic History  . Marital status: Married    Spouse name: Markus Daft  . Number of children: Not on file  . Years of education: Not on file  . Highest education level: Not on file  Occupational History  . Not on file  Social Needs  . Financial resource strain: Not hard at all  . Food insecurity    Worry: Never true    Inability: Never true  . Transportation needs    Medical: No    Non-medical: No  Tobacco Use  . Smoking status: Never Smoker  . Smokeless tobacco: Never Used  Substance and Sexual Activity  . Alcohol use: No  . Drug use: No  . Sexual activity: Yes    Birth control/protection: None  Lifestyle  . Physical activity    Days per week: Patient refused    Minutes per session: Patient refused  . Stress: To some extent  Relationships  . Social Herbalist on phone: Once a week    Gets together: Once a week    Attends religious service: Patient refused    Active member of club or organization: Patient refused    Attends meetings of clubs or organizations: Patient refused    Relationship status: Patient refused  Other Topics Concern  . Not on file  Social History Narrative  .  Not on file    Hospital Course:  From admission H&P: 14, married, has two children ( 3, 6 months- children currently being taken care of by father and patient's mother), homemaker. Patient presented to hospital on 11/23 . She reports she had recently seen her Obstetrician as she felt tired, sluggish, depressed, with feelings of anxiety and hopelessness, subjectively decreased concentration. States " I felt  like I had PMS symptoms all the time". She is 5 + months post partum . Reports she was encouraged to come to ED. She reports she has been having some depressive symptoms since her pregnancy, but has  felt more depressed over recent days to weeks. She has recently had suicidal ideations with thoughts of cutting wrist. Denies any psychotic symptoms. Reports she was recently diagnosed with Hypothyroidism. NKDA. Does not smoke. Reports she had been breast feeding up to about a week ago. She takes Prenatal Vitamins. Home medications-  Reports she has been on Zoloft for months, including during her pregnancy. She was taking 50 mgrs daily , was  titrated to 75 mgrs QDAY several weeks ago. Denies alcohol or drug abuse. Admission BAL negative, UDS negative. Was recently started on Synthroid at 200 micorgrams daily, tolerating thus far.  Ms. Sharpless was admitted for postpartum depression with suicidal ideation. She remained on the [facility] unit for four days. Zoloft was increased. New rx for Synthroid was continued. She participated in group therapy on the unit. She responded well to treatment with no adverse effects reported. She has shown improved mood, affect, sleep, and interaction. On day of discharge, she presents with euthymic affect and reports good mood. She denies any SI/HI/AVH and contracts for safety. She specifically denies thoughts of harming herself or her baby. She is discharging on the medications listed below. She agrees to follow up at [facility] (see below). Patient is provided with prescriptions for medications upon discharge. Her family is picking her up for discharge home.  Physical Findings: AIMS: Facial and Oral Movements Muscles of Facial Expression: None, normal Lips and Perioral Area: None, normal Jaw: None, normal Tongue: None, normal,Extremity Movements Upper (arms, wrists, hands, fingers): None, normal Lower (legs, knees, ankles, toes): None, normal, Trunk Movements Neck, shoulders, hips: None, normal, Overall Severity Severity of abnormal movements (highest score from questions above): None, normal Incapacitation due to abnormal movements: None, normal Patient's awareness of abnormal  movements (rate only patient's report): No Awareness, Dental Status Current problems with teeth and/or dentures?: No Does patient usually wear dentures?: No  CIWA:    COWS:     Musculoskeletal: Strength & Muscle Tone: within normal limits Gait & Station: normal Patient leans: N/A  Psychiatric Specialty Exam: Physical Exam  Nursing note and vitals reviewed. Constitutional: She is oriented to person, place, and time. She appears well-developed and well-nourished.  Cardiovascular: Normal rate.  Respiratory: Effort normal.  Neurological: She is alert and oriented to person, place, and time.    Review of Systems  Constitutional: Negative.   Respiratory: Negative for cough and shortness of breath.   Cardiovascular: Negative for chest pain.  Psychiatric/Behavioral: Positive for depression (stable on medication). Negative for hallucinations, substance abuse and suicidal ideas. The patient is not nervous/anxious and does not have insomnia.     Blood pressure 102/89, pulse 90, temperature (!) 97.1 F (36.2 C), temperature source Oral, resp. rate 20, height 5\' 5"  (1.651 m), weight 124.7 kg, SpO2 99 %, unknown if currently breastfeeding.Body mass index is 45.76 kg/m.  General Appearance: Casual  Eye Contact:  Good  Speech:  Normal Rate  Volume:  Normal  Mood:  Euthymic  Affect:  Appropriate and Congruent  Thought Process:  Coherent and Goal Directed  Orientation:  Full (Time, Place, and Person)  Thought Content:  Logical  Suicidal Thoughts:  No  Homicidal Thoughts:  No  Memory:  Immediate;   Good Recent;   Good Remote;   Good  Judgement:  Intact  Insight:  Good  Psychomotor Activity:  Normal  Concentration:  Concentration: Good and Attention Span: Good  Recall:  Good  Fund of Knowledge:  Good  Language:  Good  Akathisia:  No  Handed:  Right  AIMS (if indicated):     Assets:  Communication Skills Desire for Improvement Housing Resilience  ADL's:  Intact  Cognition:  WNL   Sleep:  Number of Hours: 6.75     Have you used any form of tobacco in the last 30 days? (Cigarettes, Smokeless Tobacco, Cigars, and/or Pipes): No  Has this patient used any form of tobacco in the last 30 days? (Cigarettes, Smokeless Tobacco, Cigars, and/or Pipes)  No  Blood Alcohol level:  Lab Results  Component Value Date   ETH <10 08/30/2019    Metabolic Disorder Labs:  No results found for: HGBA1C, MPG No results found for: PROLACTIN No results found for: CHOL, TRIG, HDL, CHOLHDL, VLDL, LDLCALC  See Psychiatric Specialty Exam and Suicide Risk Assessment completed by Attending Physician prior to discharge.  Discharge destination:  Home  Is patient on multiple antipsychotic therapies at discharge:  No   Has Patient had three or more failed trials of antipsychotic monotherapy by history:  No  Recommended Plan for Multiple Antipsychotic Therapies: NA  Discharge Instructions    Discharge instructions   Complete by: As directed    Patient is instructed to take all prescribed medications as recommended. Report any side effects or adverse reactions to your outpatient psychiatrist. Patient is instructed to abstain from alcohol and illegal drugs while on prescription medications. In the event of worsening symptoms, patient is instructed to call the crisis hotline, 911, or go to the nearest emergency department for evaluation and treatment.     Allergies as of 09/04/2019   No Known Allergies     Medication List    STOP taking these medications   ibuprofen 600 MG tablet Commonly known as: ADVIL   iron polysaccharides 150 MG capsule Commonly known as: NIFEREX     TAKE these medications     Indication  levothyroxine 200 MCG tablet Commonly known as: SYNTHROID Take 1 tablet (200 mcg total) by mouth daily at 6 (six) AM. Start taking on: September 05, 2019  Indication: Underactive Thyroid   prenatal multivitamin Tabs tablet Take 1 tablet by mouth daily at 12 noon.   Indication: Supplementation   sertraline 100 MG tablet Commonly known as: ZOLOFT Take 1 tablet (100 mg total) by mouth daily. Start taking on: September 05, 2019 What changed:   medication strength  how much to take  Indication: Major Depressive Disorder      Follow-up Information    [facility] Follow up.   Why: Your weekday social worker [facility], then call you Monday-Tuesday with that information.  [facility] Monday-Friday 8:00am-5:00pm. Contact information: [address] [place] [id number] [phone]        [facility] Follow up.   Why: Please call to schedule primary care appointment for  follow up with new diagnosis of hypothyroidism. Contact information: [address] [place] [address] [phone]          Follow-up recommendations: Activity as tolerated. Diet as recommended by primary care physician. Keep all scheduled follow-up appointments as recommended.   Comments:   Patient is instructed to take all prescribed medications as recommended. Report any side effects or adverse reactions to your outpatient psychiatrist. Patient is instructed to abstain from alcohol and illegal drugs while on prescription medications. In the event of worsening symptoms, patient is instructed to call the crisis hotline, 911, or go to the nearest emergency department for evaluation and treatment.  Signed: Aldean Baker, NP 09/04/2019, 12:37 PM   Patient seen, Suicide Assessment Completed.  Disposition Plan Reviewed

## 2019-09-04 NOTE — Progress Notes (Signed)
Pt discharged to lobby. Pt was stable and appreciative at that time. All papers  were given and valuables returned. Verbal understanding expressed. Denies SI/HI and A/VH. Pt given opportunity to express concerns and ask questions. °

## 2019-09-04 NOTE — [facility] Group Notes (Signed)
[facility] LCSW Group Therapy Note  Date/Time:    09/04/2019 10:00-11:00AM  Type of Therapy and Topic:  Group Therapy:  Practicing Kindness to Self  Participation Level:  Active   Description of Group:  The focus of this group is to examine human tendencies to be hyper-critical of self and how this leads to feelings of worthlessness, hopelessness, and shame.  Patients were guided to the concept that shame is universal and is worsened by being kept hidden, but improved by being revealed.  We discussed how feeling unworthy is the result of shame and discussed the differences between guilt and shame.  It was shared why it is important to build the ability to tolerate discomfort, since numbing emotions is not selective and unfortunately results in numbing both painful and positive feelings.  Multiple exercises were led in a shortened version of several of the skills described.   Therapeutic Goals 1. Identify statements patients automatically say to themselves, "I'll be worthy when...." and examine how this is not kind to self because it indicates we cannot be worthy until some far-reaching goal is achieved. 2. Share current healthy and unhealthy coping skills used when feeling unworthy 3. Practice multiple coping skills including a. Questioning whether they feel guilt ("I did something bad" "I made a mistake" "I did something stupid") or shame ("I am bad" "I am a mistake" "I am stupid") and whether this feeling is based in fact. b. 5 senses mindfulness c. [place] In and Out  (Zooming in highlights our flaws and Zooming Out helps [place] to see ourselves more fully with flaws and assets) d. Grounding  e. AEIOUY (Abstinence, Exercise, I, Others, Unexpressed, Yay) f. TGIF (Trust, Gratitude, Inspiration, [place] or [place]) 4. Encourage to do the needed work to Estate manager/land agent, focusing on how medication is part of the solution to emotional and mental problems, while coping skills are necessary to actually  change behavior.  Summary of Patient Progress: During group, patient expressed that one main source of shame for her is that she is a "selfish person" and a "bad wife."  She named many things that she is good at, such as being a good mother, but said she feels that her husband deserves someone to be as good to him as he is to her.  She was tearful throughout group as she talked about this.  She stated she will feel worthy when she loves her husband the way he loves her.  She showed insight into the need to change the way she thinks.   Therapeutic Modalities Processing Lecture Activities   Selmer Dominion, [place] 09/04/2019, 2:01 PM

## 2019-09-04 NOTE — Progress Notes (Signed)
  [facility] Adult Case Management Discharge Plan :  Will you be returning to the same living situation after discharge:  Yes,  with family At discharge, do you have transportation home?: Yes,  family Do you have the ability to pay for your medications: Yes,  Medicaid  Release of information consent forms completed and Emailed to Medical Records, then turned in to Medical Records by CSW.   Patient to Follow up at: Follow-up Information    [facility] Follow up.   Why: Your weekday social worker will set an appointment for intake at [facility], then call you Monday-Tuesday with that information.  You can also go to the [facility] Monday-Friday 8:00am-5:00pm. Contact information: [place] [phone]           Next level of care provider has access to [place] and Suicide Prevention discussed: Yes,   husband, Josslyn Ciolek [phone])  Have you used any form of tobacco in the last 30 days? (Cigarettes, Smokeless Tobacco, Cigars, and/or Pipes): No  Has patient been referred to the Quitline?: N/A patient is not a smoker  Patient has been referred for addiction treatment: N/A  Maretta Los, LCSW 09/04/2019, 10:09 AM

## 2019-09-04 NOTE — Progress Notes (Signed)
[place] NOVEL CORONAVIRUS (COVID-19) DAILY CHECK-OFF SYMPTOMS - answer yes or no to each - every day NO YES  Have you had a fever in the past 24 hours?  . Fever (Temp > 37.80C / 100F) X   Have you had any of these symptoms in the past 24 hours? . New Cough .  Sore Throat  .  Shortness of Breath .  Difficulty Breathing .  Unexplained Body Aches   X   Have you had any one of these symptoms in the past 24 hours not related to allergies?   . Runny Nose .  Nasal Congestion .  Sneezing   X   If you have had runny nose, nasal congestion, sneezing in the past 24 hours, has it worsened?  X   EXPOSURES - check yes or no X   Have you traveled outside the state in the past 14 days?  X   Have you been in contact with someone with a confirmed diagnosis of COVID-19 or PUI in the past 14 days without wearing appropriate PPE?  X   Have you been living in the same home as a person with confirmed diagnosis of COVID-19 or a PUI (household contact)?    X   Have you been diagnosed with COVID-19?    X              What to do next: Answered NO to all: Answered YES to anything:   Proceed with unit schedule Follow the [facility] Inpatient Flowsheet.   

## 2019-09-04 NOTE — [facility] Suicide Risk Assessment (Signed)
[facility] INPATIENT:  Family/Significant Other Suicide Prevention Education  Suicide Prevention Education:  Education Completed;  husband, Kizzi Overbey [phone]),  (name of family member/significant other) has been identified by the patient as the family member/significant other with whom the patient will be residing, and identified as the person(s) who will aid the patient in the event of a mental health crisis (suicidal ideations/suicide attempt).  With written consent from the patient, the family member/significant other has been provided the following suicide prevention education, prior to the and/or following the discharge of the patient.  Husband states he has suffered with depression his whole life and is familiar with suicide prevention protocol.  He is only concerned that patient will have a prescription for 30 days of medicine and will have follow-up in place.  This was discussed.  He asked about an endocrinology appointment and was told to call primary care physician at [facility] and talk to referral coordinator about that.  The suicide prevention education provided includes the following:  Suicide risk factors  Suicide prevention and interventions  National Suicide Hotline telephone number  [facility] assessment telephone number  [facility] Emergency Assistance [place] and/or Residential Mobile Crisis Unit telephone number  Request made of family/significant other to:  Remove weapons (e.g., guns, rifles, knives), all items previously/currently identified as safety concern.    Remove drugs/medications (over-the-counter, prescriptions, illicit drugs), all items previously/currently identified as a safety concern.  The family member/significant other verbalizes understanding of the suicide prevention education information provided.  The family member/significant other agrees to remove the items of safety concern listed above.  Hannah Acosta  Hannah Acosta 09/04/2019, 10:04 AM

## 2019-09-04 NOTE — Progress Notes (Signed)
D. Pt visible in the milieu interacting well with peers-pt is friendly, smiles upon approach. Per pt's self inventory, pt rated her depression, hopelessness and anxiety a 3/0/3, respectively. Pt writes that her goal today is "going home". Pt currently denies SI/HI and AVH A. Labs and vitals monitored. Pt compliant with  medications. Pt supported emotionally and encouraged to express concerns and ask questions.   R. Pt remains safe with 15 minute checks. Will continue POC.

## 2019-09-04 NOTE — [facility] Suicide Risk Assessment (Addendum)
[facility] Discharge Suicide Risk Assessment   Principal Problem: MDD (major depressive disorder), severe (HCC) Discharge Diagnoses: Principal Problem:   MDD (major depressive disorder), severe (HCC) Active Problems:   Hypothyroidism   Total Time spent with patient: 30 minutes  Musculoskeletal: Strength & Muscle Tone: within normal limits Gait & Station: normal Patient leans: N/A  Psychiatric Specialty Exam: ROS no headache, no chest pain, no shortness of breath, no cough, no vomiting  Blood pressure 102/89, pulse 90, temperature (!) 97.1 F (36.2 C), temperature source Oral, resp. rate 20, height 5\' 5"  (1.651 m), weight 124.7 kg, SpO2 99 %, unknown if currently breastfeeding.Body mass index is 45.76 kg/m.  General Appearance: Well Groomed  Eye Contact::  Good  Speech:  Normal Rate409  Volume:  Normal  Mood:  Improved mood, reports feeling better than on admission  Affect:  Appropriate and Reactive  Thought Process:  Linear and Descriptions of Associations: Intact  Orientation:  Full (Time, Place, and Person)  Thought Content:  No hallucinations, no delusions  Suicidal Thoughts:  No denies suicidal or self-injurious ideations, denies homicidal or violent ideations  Homicidal Thoughts:  No  Memory:  Recent and remote grossly intact  Judgement:  Other:  Improving  Insight:  Present  Psychomotor Activity:  Normal  Concentration:  Good  Recall:  Good  Fund of Knowledge:Good  Language: Good  Akathisia:  Negative  Handed:  Right  AIMS (if indicated):     Assets:  Communication Skills Desire for Improvement Resilience  Sleep:  Number of Hours: 6.75  Cognition: WNL  ADL's:  Intact   Mental Status Per Nursing Assessment::   On Admission:  Suicidal ideation indicated by patient, Self-harm thoughts  Demographic Factors:  9, married, 2 children ages 33 years old than 46 months old, homemaker  Loss Factors: Recently diagnosed with hypothyroidism  Historical Factors: History of  anxiety, panic attacks, no prior history of psychiatric admissions, no prior history of suicide attempts  Risk Reduction Factors:   Responsible for children under 25 years of age, Sense of responsibility to family, Living with another person, especially a relative, Positive social support and Positive coping skills or problem solving skills  Continued Clinical Symptoms:  Patient presents alert, attentive, calm, pleasant on approach, describes mood as improved, affect appears more reactive and full in range, no thought disorder, no suicidal or self-injurious ideations, no homicidal or violent ideations, no hallucinations, no delusions, future oriented. Behavior on unit in good control, interacting appropriately with peers. Tolerating medications well, denies side effects. With her expressed consent I spoke with her husband 15) on the phone-he corroborates that patient has improved, and is in agreement with discharge today. Cognitive Features That Contribute To Risk:  No gross cognitive deficits noted upon discharge. Is alert , attentive, and oriented x 3   Suicide Risk:  Mild  Follow-up Information    [facility] Follow up.   Why: Your weekday social worker will set an appointment for intake at [facility], then call you Monday-Tuesday with that information.  You can also go to the [facility] Monday-Friday 8:00am-5:00pm. Contact information: [address] [place] [phone]         [facility] Follow up.   Why: Please call to schedule primary care appointment for follow up with new diagnosis of hypothyroidism. Contact information: [address] [phone] [place] [phone]          Plan Of Care/Follow-up recommendations:  Activity:  As tolerated Diet:  Regular Tests:  NA Other:  See below  Patient expresses readiness for discharge, is leaving unit in good spirits, plans to return home , follow-up  as above.  We have reviewed the importance of following up for ongoing management of hypothyroidism, which was recently diagnosed.  She expresses understanding. Jenne Campus, MD 09/04/2019, 12:52 PM

## 2019-09-14 NOTE — Progress Notes (Signed)
Patient ID: Hannah Acosta, female   DOB: November 07, 1985, 33 y.o.   MRN: [id number]  09/14/2019 phone communication Received call from patient's PCP, Dr. Radene Ou. Dr. Radene Ou informed me that patient has had difficulty with securing  psychiatric  follow up post discharge- was referred to [facility] but apparently there has been no answer at phone number that was  listed along with referral.  I spoke with patient via phone. She reports she is currently feeling better and is functioning well in her daily activities, denies SI. Reports she has attempted to leave messages to make an initial outpatient appointment without success thus far . I have relayed this to [facility] department to review and provide patient with follow up.  Gabriel Earing MD

## 2019-10-08 DIAGNOSIS — Z8616 Personal history of COVID-19: Secondary | ICD-10-CM

## 2019-10-08 HISTORY — DX: Personal history of COVID-19: Z86.16

## 2020-02-22 ENCOUNTER — Encounter: Payer: Self-pay | Admitting: Physical Therapy

## 2020-02-22 ENCOUNTER — Ambulatory Visit: Payer: Medicaid Other | Attending: Obstetrics & Gynecology | Admitting: Physical Therapy

## 2020-02-22 ENCOUNTER — Other Ambulatory Visit: Payer: Self-pay

## 2020-02-22 DIAGNOSIS — M533 Sacrococcygeal disorders, not elsewhere classified: Secondary | ICD-10-CM | POA: Diagnosis not present

## 2020-02-22 DIAGNOSIS — R279 Unspecified lack of coordination: Secondary | ICD-10-CM | POA: Insufficient documentation

## 2020-02-22 DIAGNOSIS — M6281 Muscle weakness (generalized): Secondary | ICD-10-CM | POA: Insufficient documentation

## 2020-02-22 DIAGNOSIS — R32 Unspecified urinary incontinence: Secondary | ICD-10-CM | POA: Diagnosis not present

## 2020-02-22 NOTE — Therapy (Signed)
[place] at [facility] [address] [place], [place], [address] Phone: [phone]   Fax:  [phone]  Physical Therapy Evaluation  Patient Details  Name: Hannah Acosta MRN: [id number] Date of Birth: May 11, 1986 Referring Provider (PT): Dr. Waymon Amato   Encounter Date: 02/22/2020  PT End of Session - 02/22/20 1026    Visit Number  1    Date for PT Re-Evaluation  05/16/20    Authorization Type  Medicaid    PT Start Time  0918    PT Stop Time  1003    PT Time Calculation (min)  45 min    Activity Tolerance  Patient tolerated treatment well    Behavior During Therapy  [facility] for tasks assessed/performed       Past Medical History:  Diagnosis Date  . Anemia   . Anxiety   . Depression   . Pregnancy induced hypertension     Past Surgical History:  Procedure Laterality Date  . CESAREAN SECTION N/A 08/18/2016   Procedure: CESAREAN SECTION;  Surgeon: Princess Bruins, MD;  Location: [facility];  Service: Obstetrics;  Laterality: N/A;  . CESAREAN SECTION N/A 03/07/2019   Procedure: CESAREAN SECTION;  Surgeon: Everett Graff, MD;  Location: [facility] LD [facility]S;  Service: Obstetrics;  Laterality: N/A;    There were no vitals filed for this visit.   Subjective Assessment - 02/22/20 0927    Subjective  I get tailbone pain. Patient has the perifit device. Patient report urinary incontinece after her first child 3 years ago. 08/18/16 birth was in labor for 4 days and the babys head kept on pushing onto her bladder. Patient drips all day. Does not feel urine come out with activity. Able to get to the bathroom in time.    Patient Stated Goals  not have to use pantyliners, reduce leakage, feel less self concious about the leakage    Currently in Pain?  Yes    Pain Score  9     Pain Location  Coccyx    Pain Orientation  Mid    Pain Descriptors / Indicators  Sharp    Pain Type  Acute pain    Pain Onset  1 to 4 weeks ago    Pain  Frequency  Intermittent    Aggravating Factors   sitting, getting out of a chair    Pain Relieving Factors  chiroprator, ibpurfen         [facility] PT Assessment - 02/22/20 0001      Assessment   Medical Diagnosis  R32 Unspeicied urinary incontinence    Referring Provider (PT)  Dr. Waymon Amato    Onset Date/Surgical Date  08/18/16    Prior Therapy  none      Precautions   Precautions  None    Precaution Comments  breast feeding      Restrictions   Weight Bearing Restrictions  No      Balance Screen   Has the patient fallen in the past 6 months  No    Has the patient had a decrease in activity level because of a fear of falling?   No    Is the patient reluctant to leave their home because of a fear of falling?   No      Home Film/video editor residence      Prior Function   Level of Independence  Independent    Vocation  Part time employment  Vocation Requirements  veternary reception- walking, carrying big bags of dog food    Leisure  goes to [employer] and me baby class, walks in the park      Cognition   Overall Cognitive Status  Within Functional Limits for tasks assessed      Posture/Postural Control   Posture/Postural Control  No significant limitations    Posture Comments  tightness in the thoracic lumbar junction; rib angle greater than 90 degrees      ROM / Strength   AROM / PROM / Strength  AROM;PROM;Strength      AROM   Overall AROM Comments  lumbar ROM is full      Strength   Right Hip ABduction  3+/5    Left Hip ABduction  4/5      Palpation   Spinal mobility  decreased mobility of T8-L3    SI assessment   left ilium posteriorly rotated; sacrum rotated left    Palpation comment  tenderness located on the left coccyx, left hip adductor, left hip flexor                   Objective measurements completed on examination: See above findings.    Pelvic Floor Special Questions - 02/22/20 0001    Prior Pregnancies  Yes     Number of C-Sections  2   scar mobilie   Diastasis Recti  3 fingers width ablove the umbilicus with doming    Currently Sexually Active  Yes    Is this Painful  No    Marinoff Scale  no problems    Urinary Leakage  Yes    Pad use  3-4 panty liners    Activities that cause leaking  Other   all day   Urinary urgency  Yes   after goes to bed   Urinary frequency  no    Fecal incontinence  No    Falling out feeling (prolapse)  No    Skin Integrity  Intact    Prolapse  Other    Prolapse other  when patient beared down there was a desend fo the urteine or prolapse by grade 1 but hard to tell structure at this time    Pelvic Floor Internal Exam  Patient confirms identification and approves PT to assess patient and treatment of the pelvic floor    Exam Type  Vaginal    Palpation  Tightness in the posterior introitus, left levator ani by the coccyx    Strength  weak squeeze, no lift    Strength # of seconds  5               PT Education - 02/22/20 1026    Education Details  how to use a ball to massage the left pelvic floor; massage the posterior introitus    Person(s) Educated  Patient    Methods  Explanation;Demonstration    Comprehension  Verbalized understanding       PT Short Term Goals - 02/22/20 1037      PT SHORT TERM GOAL #1   Title  independent with initial HEP    Time  4    Period  Weeks    Status  New    Target Date  03/21/20        PT Long Term Goals - 02/22/20 1038      PT LONG TERM GOAL #1   Title  independent with advanced HEP    Baseline  not educated yet  Time  12    Period  Weeks    Status  New    Target Date  05/16/20      PT LONG TERM GOAL #2   Title  coccyx pain going from sit to stand and sitting </- 0-1/10    Baseline  pain level is 8/10    Time  12    Period  Weeks    Status  New    Target Date  05/16/20      PT LONG TERM GOAL #3   Title  urinary leakage decreased >/= 80% due to pelvic floor strength >/= 4/5 with circular  contraction and lift    Baseline  pelvic floor strength is 2/5    Time  12    Period  Weeks    Status  New    Target Date  05/16/20      PT LONG TERM GOAL #4   Title  able to contract the abdominals without doming due to improve abdominal contraction and reduction of diastasis recti with improved tenstion generated    Baseline  diatasis is 3 finger width above the umbilicus, when contract the abdomen she will dome the midling    Time  12    Period  Weeks    Status  New    Target Date  05/16/20             Plan - 02/22/20 1027    Clinical Impression Statement  Patient is a 34 year old female with urinary incontinence since 08/18/1016 when she had her first child. Patient reports intermittent coccyx pain since her last child on 03/07/2019. Patient reports her coccyx pain is 8/10  with sitting and sit to stand. Patient uses 3-4 pantyliners due to continues leakage. She only has urgency at night. Patient reports no particular activity cause her leakage. Bilateral hip abduction strength is 3+/5. Decreased mobility of lower rib cage. Tightness in the T5-L3 vertebrae. Left ilium is posteriorly rotated and sacrum rotated left. Tenderness located in the left levator ani, left hip flexor, and left hip adductor, Diastasis Recti is 3 fingers width above the umbilicus. C-section scar has good mobilty. Pelvic floor strength is 2/5 with no lift. Tightness in the posterior introitus. Patient will benefit from skilled therapy to improve pelvic floor strength and coordination and reduce coccyx pain.    Personal Factors and Comorbidities  Comorbidity 1    Comorbidities  C-section 08/18/2016, 03/07/2019    Examination-Activity Limitations  Continence;Sit    Examination-Participation Restrictions  Community Activity;Interpersonal Relationship    Stability/Clinical Decision Making  Stable/Uncomplicated    Clinical Decision Making  Low    Rehab Potential  Excellent    PT Frequency  1x / week    PT Duration   12 weeks    PT Treatment/Interventions  Cryotherapy;Electrical Stimulation;Moist Heat;Therapeutic exercise;Therapeutic activities;Neuromuscular re-education;Ultrasound;Patient/family education;Manual techniques;Dry needling;Spinal Manipulations    PT Next Visit Plan  work on rib cage mobilty, soft tissue work from back to anterior abdomen to work on diastasis, check pelvic alignment, engaging the abdomen to reduce bulge, pelvic floor tissue work by the left levator    Consulted and Agree with Plan of Care  Patient       Patient will benefit from skilled therapeutic intervention in order to improve the following deficits and impairments:  Decreased activity tolerance, Decreased strength, Increased fascial restricitons, Pain, Increased muscle spasms  Visit Diagnosis: Muscle weakness (generalized) - Plan: PT plan of care cert/re-cert  Unspecified lack of coordination -  Plan: PT plan of care cert/re-cert  Coccyx pain - Plan: PT plan of care cert/re-cert     Problem List Patient Active Problem List   Diagnosis Date Noted  . Elevated TSH 08/31/2019  . Hypothyroidism 08/31/2019  . MDD (major depressive disorder), severe (HCC) 08/31/2019  . Gestational hypertension affecting second pregnancy 03/06/2019  . Depression 03/05/2019  . Gestational hypertension 03/05/2019  . History of cesarean delivery affecting pregnancy 03/05/2019  . Desires VBAC (vaginal birth after cesarean) trial 03/05/2019  . Acute blood loss anemia 08/19/2016  . Cesarean delivery delivered 08/18/2016  . Postpartum care following cesarean delivery (11/12) Indication: arrest of dilation 08/18/2016  . Binge eating disorder 12/12/2015  . Anxiety 05/11/2015  . Morbid obesity with BMI of 45.0-49.9, adult [facility]) 05/11/2015    Eulis Foster, PT 02/22/20 10:44 AM   [facility] [address] [place], [place], [address] Phone: [phone]   Fax:   [phone]  Name: Hannah Acosta MRN: [id number] Date of Birth: June 22, 1986

## 2020-02-29 ENCOUNTER — Encounter: Payer: Self-pay | Admitting: Physical Therapy

## 2020-02-29 ENCOUNTER — Other Ambulatory Visit: Payer: Self-pay

## 2020-02-29 ENCOUNTER — Encounter: Payer: Medicaid Other | Attending: Obstetrics & Gynecology | Admitting: Physical Therapy

## 2020-02-29 DIAGNOSIS — M6281 Muscle weakness (generalized): Secondary | ICD-10-CM

## 2020-02-29 DIAGNOSIS — R279 Unspecified lack of coordination: Secondary | ICD-10-CM | POA: Diagnosis present

## 2020-02-29 DIAGNOSIS — M533 Sacrococcygeal disorders, not elsewhere classified: Secondary | ICD-10-CM | POA: Insufficient documentation

## 2020-02-29 NOTE — Therapy (Signed)
[place] [facility] [address] [place], [place], [address] Phone: [phone]   Fax:  [phone]  Physical Therapy Treatment  Patient Details  Name: Hannah Acosta MRN: [id number] Date of Birth: [birth year] Referring Provider (PT): Dr. Waymon Amato   Encounter Date: 02/29/2020  PT End of Session - 02/29/20 0843    Visit Number  2    Date for PT Re-Evaluation  05/16/20    Authorization Type  Medicaid    Authorization Time Period  02/29/2020-03/20/2020    Authorization - Visit Number  1    Authorization - Number of Visits  3    PT Start Time  0830    PT Stop Time  0915    PT Time Calculation (min)  45 min    Activity Tolerance  Patient tolerated treatment well    Behavior During Therapy  [facility] for tasks assessed/performed       Past Medical History:  Diagnosis Date  . Anemia   . Anxiety   . Depression   . Pregnancy induced hypertension     Past Surgical History:  Procedure Laterality Date  . CESAREAN SECTION N/A 08/18/2016   Procedure: CESAREAN SECTION;  Surgeon: Princess Bruins, MD;  Location: [facility];  Service: Obstetrics;  Laterality: N/A;  . CESAREAN SECTION N/A 03/07/2019   Procedure: CESAREAN SECTION;  Surgeon: Everett Graff, MD;  Location: [place] [facility];  Service: Obstetrics;  Laterality: N/A;    There were no vitals filed for this visit.  Subjective Assessment - 02/29/20 0835    Subjective  I tried the balls at home but not hard enough. I forgot to do the massage to the perineal body.    Patient Stated Goals  not have to use pantyliners, reduce leakage, feel less self concious about the leakage    Currently in Pain?  Yes    Pain Score  4     Pain Location  Coccyx    Pain Orientation  Mid    Pain Descriptors / Indicators  Dull    Pain Type  Acute pain    Pain Onset  1 to 4 weeks ago    Pain Frequency  Intermittent    Aggravating Factors   sitting, getting out of a chair    Pain Relieving Factors   chiropractor, Ibuprofen    Multiple Pain Sites  No                     Pelvic Floor Special Questions - 02/29/20 0001    Diastasis Recti  2 fingers width above umilicus        [facility] Adult PT Treatment/Exercise - 02/29/20 0001      Therapeutic Activites    Therapeutic Activities  Other Therapeutic Activities    Other Therapeutic Activities  engaging abdominals with transitional movements      Lumbar Exercises: Stretches   Active Hamstring Stretch  Right;Left;1 rep;30 seconds    Active Hamstring Stretch Limitations  supine with strap    Other Lumbar Stretch Exercise  supine lateral hip stretch with strap in supine    Other Lumbar Stretch Exercise  Happy baby       Lumbar Exercises: Supine   Ab Set  10 reps;5 seconds      Manual Therapy   Manual Therapy  Soft tissue mobilization;Myofascial release;Joint mobilization    Joint Mobilization  posterior and lateral rib cage mobilization to improve mobility; PA and rotational mobilization to T4-T10 to  imporve movement    Soft tissue mobilization  bilatearl gluteal,, left coccygeus, latearl trunk , abdominals    Myofascial Release  using suction cup to posterior thoracic and lumbar to imporve mobility of tissue             PT Education - 02/29/20 0916    Education Details  Access Code: [email or web address]    Person(s) Educated  Patient    Methods  Explanation;Demonstration;Verbal cues;Handout    Comprehension  Returned demonstration;Verbalized understanding       PT Short Term Goals - 02/22/20 1037      PT SHORT TERM GOAL #1   Title  independent with initial HEP    Time  4    Period  Weeks    Status  New    Target Date  03/21/20        PT Long Term Goals - 02/22/20 1038      PT LONG TERM GOAL #1   Title  independent with advanced HEP    Baseline  not educated yet    Time  12    Period  Weeks    Status  New    Target Date  05/16/20      PT LONG TERM GOAL #2   Title  coccyx pain going from sit to stand  and sitting </- 0-1/10    Baseline  pain level is 8/10    Time  12    Period  Weeks    Status  New    Target Date  05/16/20      PT LONG TERM GOAL #3   Title  urinary leakage decreased >/= 80% due to pelvic floor strength >/= 4/5 with circular contraction and lift    Baseline  pelvic floor strength is 2/5    Time  12    Period  Weeks    Status  New    Target Date  05/16/20      PT LONG TERM GOAL #4   Title  able to contract the abdominals without doming due to improve abdominal contraction and reduction of diastasis recti with improved tenstion generated    Baseline  diatasis is 3 finger width above the umbilicus, when contract the abdomen she will dome the midling    Time  12    Period  Weeks    Status  New    Target Date  05/16/20            Plan - 02/29/20 0842    Clinical Impression Statement  After manual work the diastasis recti decreased to 2 fingers width. Patient has increased reib movement. Rib angle at 90 degrees now. Patient is able to feel the lower abdominals engage now. Patient has tenderness in the left coccygeus. Patient will benefit from skilled therapy to improve pelvic floor strength and coordination and reduce coccyx pain.    Personal Factors and Comorbidities  Comorbidity 1    Comorbidities  C-section 08/18/2016, 03/07/2019    Examination-Activity Limitations  Continence;Sit    Examination-Participation Restrictions  Community Activity;Interpersonal Relationship    Stability/Clinical Decision Making  Stable/Uncomplicated    Rehab Potential  Excellent    PT Frequency  1x / week    PT Duration  12 weeks    PT Treatment/Interventions  Cryotherapy;Electrical Stimulation;Moist Heat;Therapeutic exercise;Therapeutic activities;Neuromuscular re-education;Ultrasound;Patient/family education;Manual techniques;Dry needling;Spinal Manipulations    PT Next Visit Plan  check pelvic alignment, pelvic floor tissue work by the left levator, work on abdominal contraction  with leg and  arm movement    PT Home Exercise Plan  Access Code: [email or web address]    Consulted and Agree with Plan of Care  Patient       Patient will benefit from skilled therapeutic intervention in order to improve the following deficits and impairments:  Decreased activity tolerance, Decreased strength, Increased fascial restricitons, Pain, Increased muscle spasms  Visit Diagnosis: Muscle weakness (generalized)  Unspecified lack of coordination  Coccyx pain     Problem List Patient Active Problem List   Diagnosis Date Noted  . Elevated TSH 08/31/2019  . Hypothyroidism 08/31/2019  . MDD (major depressive disorder), severe (HCC) 08/31/2019  . Gestational hypertension affecting second pregnancy 03/06/2019  . Depression 03/05/2019  . Gestational hypertension 03/05/2019  . History of cesarean delivery affecting pregnancy 03/05/2019  . Desires VBAC (vaginal birth after cesarean) trial 03/05/2019  . Acute blood loss anemia 08/19/2016  . Cesarean delivery delivered 08/18/2016  . Postpartum care following cesarean delivery (11/12) Indication: arrest of dilation 08/18/2016  . Binge eating disorder 12/12/2015  . Anxiety 05/11/2015  . Morbid obesity with BMI of 45.0-49.9, adult [facility]) 05/11/2015    Eulis Foster, PT 02/29/20 9:28 AM   [facility] [address], [address] [place], [place], [address] Phone: [phone]   Fax:  [phone]  Name: Hannah Acosta MRN: [id number] Date of Birth: 02-Jun-1986

## 2020-02-29 NOTE — Patient Instructions (Signed)
Access Code: [email or web address] URL: [email or web address] Date: 02/29/2020 Prepared by: Eulis Foster  Exercises Cat-Camel to Child's Pose - 1 x daily - 7 x weekly - 1 sets - 10 reps - 30 sec hold Supine Hamstring Stretch with Strap - 1 x daily - 7 x weekly - 1 sets - 2 reps - 30 sec hold Hip Adductors and Hamstring Stretch with Strap - 1 x daily - 7 x weekly - 1 sets - 2 reps - 30 sec hold Supine Pelvic Floor Stretch - 1 x daily - 7 x weekly - 1 sets - 1 reps - 30 sec hold Supine Transversus Abdominis Bracing - Hands on Stomach - 3 x daily - 7 x weekly - 5 reps - 5 sec hold [facility] [address] [place], [place] [address] Phone # [phone] Fax [phone]

## 2020-03-07 ENCOUNTER — Encounter: Payer: Medicaid Other | Attending: Obstetrics & Gynecology | Admitting: Physical Therapy

## 2020-03-07 ENCOUNTER — Other Ambulatory Visit: Payer: Self-pay

## 2020-03-07 ENCOUNTER — Encounter: Payer: Self-pay | Admitting: Physical Therapy

## 2020-03-07 DIAGNOSIS — M6281 Muscle weakness (generalized): Secondary | ICD-10-CM | POA: Diagnosis not present

## 2020-03-07 DIAGNOSIS — R279 Unspecified lack of coordination: Secondary | ICD-10-CM | POA: Diagnosis present

## 2020-03-07 DIAGNOSIS — M533 Sacrococcygeal disorders, not elsewhere classified: Secondary | ICD-10-CM | POA: Diagnosis present

## 2020-03-07 NOTE — Therapy (Signed)
[place] [facility] [address] [place], [place], [address] Phone: [phone]   Fax:  [phone]  Physical Therapy Treatment  Patient Details  Name: Hannah Acosta MRN: [id number] Date of Birth: Nov 04, 1985 Referring Provider (PT): Dr. Waymon Amato   Encounter Date: 03/07/2020  PT End of Session - 03/07/20 1138    Visit Number  3    Authorization Type  Medicaid    Authorization Time Period  02/29/2020-03/20/2020    Authorization - Visit Number  2    Authorization - Number of Visits  3    PT Start Time  1030    PT Stop Time  1120    PT Time Calculation (min)  50 min    Activity Tolerance  Patient tolerated treatment well;No increased pain    Behavior During Therapy  WFL for tasks assessed/performed       Past Medical History:  Diagnosis Date  . Anemia   . Anxiety   . Depression   . Pregnancy induced hypertension     Past Surgical History:  Procedure Laterality Date  . CESAREAN SECTION N/A 08/18/2016   Procedure: CESAREAN SECTION;  Surgeon: Princess Bruins, MD;  Location: [place];  Service: Obstetrics;  Laterality: N/A;  . CESAREAN SECTION N/A 03/07/2019   Procedure: CESAREAN SECTION;  Surgeon: Everett Graff, MD;  Location: [place] [facility];  Service: Obstetrics;  Laterality: N/A;    There were no vitals filed for this visit.  Subjective Assessment - 03/07/20 1035    Subjective  I was able to do the leg and abdominal exercises. I have not noticed a difference in the leakage yet. I still wear 3-4 pads per day. I have to change underwear with pads. the coccyx pain is 50% better. NOtice when sit up with twinge.    Patient Stated Goals  not have to use pantyliners, reduce leakage, feel less self concious about the leakage    Currently in Pain?  Yes    Pain Score  5     Pain Location  Coccyx    Pain Orientation  Mid    Pain Descriptors / Indicators  Dull    Pain Type  Acute pain    Pain Onset  1 to 4 weeks ago     Pain Frequency  Intermittent    Aggravating Factors   sitting, getting out of a chair    Pain Relieving Factors  chiropractor, Ibuprofen    Multiple Pain Sites  No         [facility] PT Assessment - 03/07/20 0001      Palpation   SI assessment   ASIS is equal                 Pelvic Floor Special Questions - 03/07/20 0001    Pad use  3-4 panty liners    Pelvic Floor Internal Exam  Patient confirms identification and approves PT to assess patient and treatment of the pelvic floor    Exam Type  Vaginal    Palpation  tightness in the posterior pelvic floor with pain in coccyx with pelvic floor contraction    Strength  fair squeeze, definite lift        [facility] Adult PT Treatment/Exercise - 03/07/20 0001      Lumbar Exercises: Seated   Other Seated Lumbar Exercises  pelvic floor contaction with ball squeeze and therapist gently pulling the coccyx forward to redcue coccyx with contraction to reduce coccyx pain  Lumbar Exercises: Supine   Ab Set  10 reps;5 seconds    AB Set Limitations  with pelvic floor contraction and head lift focusing on not bearing down    Other Supine Lumbar Exercises  supine pelvic floor contraction with abdominal bracing with alternate shoulder flexion      Manual Therapy   Manual Therapy  Internal Pelvic Floor;Soft tissue mobilization    Soft tissue mobilization  soft tissue work to the left levator ani externally; patient sit on the therapist fingers tah are located anterior to the coccyx and resist the coccyx from going forward with contraction, leaning side to side    Internal Pelvic Floor  release along the sides of the introitus, urethra sphincter, release of the psterior pelvic floor             PT Education - 03/07/20 1130    Education Details  Access Code: [email or web address]; contract the pelvic floor when cough or sneeze    Person(s) Educated  Patient    Methods  Explanation;Demonstration;Handout    Comprehension  Verbalized  understanding;Returned demonstration       PT Short Term Goals - 03/07/20 1144      PT SHORT TERM GOAL #1   Title  independent with initial HEP    Time  4    Period  Weeks    Status  Achieved    Target Date  03/21/20        PT Long Term Goals - 02/22/20 1038      PT LONG TERM GOAL #1   Title  independent with advanced HEP    Baseline  not educated yet    Time  12    Period  Weeks    Status  New    Target Date  05/16/20      PT LONG TERM GOAL #2   Title  coccyx pain going from sit to stand and sitting </- 0-1/10    Baseline  pain level is 8/10    Time  12    Period  Weeks    Status  New    Target Date  05/16/20      PT LONG TERM GOAL #3   Title  urinary leakage decreased >/= 80% due to pelvic floor strength >/= 4/5 with circular contraction and lift    Baseline  pelvic floor strength is 2/5    Time  12    Period  Weeks    Status  New    Target Date  05/16/20      PT LONG TERM GOAL #4   Title  able to contract the abdominals without doming due to improve abdominal contraction and reduction of diastasis recti with improved tenstion generated    Baseline  diatasis is 3 finger width above the umbilicus, when contract the abdomen she will dome the midling    Time  12    Period  Weeks    Status  New    Target Date  05/16/20            Plan - 03/07/20 1038    Clinical Impression Statement  Patient reports her coccyx pain is 50% better. Her leakage has not changed yet. Her pelvic floor strength has increased to 3/5 today with tactile cues to the muscles and with lower abdominal contraction. Patient needed tactile cues to contract more anterior than posteriorly of the pelvic floor to reduce the coccyx pain. Patient will bear down with laughing and lifting head up in  supine posiion. Patient will benefit from skilled therapy to improve pelvic floor coordination and reduce the coccyx pain.    Personal Factors and Comorbidities  Comorbidity 1    Examination-Activity  Limitations  Continence;Sit    Examination-Participation Restrictions  Community Activity;Interpersonal Relationship    Stability/Clinical Decision Making  Stable/Uncomplicated    Rehab Potential  Excellent    PT Frequency  1x / week    PT Duration  12 weeks    PT Treatment/Interventions  Cryotherapy;Electrical Stimulation;Moist Heat;Therapeutic exercise;Therapeutic activities;Neuromuscular re-education;Ultrasound;Patient/family education;Manual techniques;Dry needling;Spinal Manipulations    PT Next Visit Plan  lifting with breath to reduce leakage, See how the coccyx pain is, pelvic floor contraction with leg movement and bridge, quadruped    PT Home Exercise Plan  Access Code: [email or web address]    Consulted and Agree with Plan of Care  Patient       Patient will benefit from skilled therapeutic intervention in order to improve the following deficits and impairments:  Decreased activity tolerance, Decreased strength, Increased fascial restricitons, Pain, Increased muscle spasms  Visit Diagnosis: Muscle weakness (generalized)  Unspecified lack of coordination  Coccyx pain     Problem List Patient Active Problem List   Diagnosis Date Noted  . Elevated TSH 08/31/2019  . Hypothyroidism 08/31/2019  . MDD (major depressive disorder), severe (HCC) 08/31/2019  . Gestational hypertension affecting second pregnancy 03/06/2019  . Depression 03/05/2019  . Gestational hypertension 03/05/2019  . History of cesarean delivery affecting pregnancy 03/05/2019  . Desires VBAC (vaginal birth after cesarean) trial 03/05/2019  . Acute blood loss anemia 08/19/2016  . Cesarean delivery delivered 08/18/2016  . Postpartum care following cesarean delivery (11/12) Indication: arrest of dilation 08/18/2016  . Binge eating disorder 12/12/2015  . Anxiety 05/11/2015  . Morbid obesity with BMI of 45.0-49.9, adult [facility]) 05/11/2015    Eulis Foster, PT 03/07/20 11:46 AM    Outpatient [facility] at  [facility] [address], [address] [place], [place], [address] Phone: [phone]   Fax:  [phone]  Name: Hannah Acosta MRN: [id number] Date of Birth: [birth year]

## 2020-03-07 NOTE — Patient Instructions (Signed)
Access Code: [email or web address] URL: [email or web address] Date: 02/29/2020 Prepared by: Eulis Foster  Exercises Cat-Camel to Child's Pose - 1 x daily - 7 x weekly - 1 sets - 10 reps - 30 sec hold Supine Hamstring Stretch with Strap - 1 x daily - 7 x weekly - 1 sets - 2 reps - 30 sec hold Hip Adductors and Hamstring Stretch with Strap - 1 x daily - 7 x weekly - 1 sets - 2 reps - 30 sec hold Supine Pelvic Floor Stretch - 1 x daily - 7 x weekly - 1 sets - 1 reps - 30 sec hold Supine Transversus Abdominis Bracing - Hands on Stomach - 3 x daily - 7 x weekly - 5 reps - 5 sec hold Supine Alternating Shoulder Flexion - 1 x daily - 7 x weekly - 3 sets - 4 reps Seated Pelvic Floor Contraction - 2 x daily - 7 x weekly - 1 sets - 5 reps - 4 sec hold [facility] [address], [address] [place], [place] [address] Phone # [phone] Fax [phone]

## 2020-03-14 ENCOUNTER — Other Ambulatory Visit: Payer: Self-pay

## 2020-03-14 ENCOUNTER — Encounter: Payer: Medicaid Other | Admitting: Physical Therapy

## 2020-03-14 ENCOUNTER — Encounter: Payer: Self-pay | Admitting: Physical Therapy

## 2020-03-14 DIAGNOSIS — R279 Unspecified lack of coordination: Secondary | ICD-10-CM

## 2020-03-14 DIAGNOSIS — M533 Sacrococcygeal disorders, not elsewhere classified: Secondary | ICD-10-CM

## 2020-03-14 DIAGNOSIS — M6281 Muscle weakness (generalized): Secondary | ICD-10-CM

## 2020-03-14 NOTE — Therapy (Signed)
[facility] at [facility] [address], [address] [place], [place], [address] Phone: [phone]   Fax:  [phone]  Physical Therapy Treatment  Patient Details  Name: Hannah Acosta MRN: [id number] Date of Birth: 04-01-1986 Referring Provider (PT): Dr. Hoover Browns   Encounter Date: 03/14/2020  PT End of Session - 03/14/20 0916    Visit Number  4    Date for PT Re-Evaluation  05/16/20    Authorization Type  Medicaid    Authorization Time Period  02/29/2020-03/20/2020    Authorization - Visit Number  3    Authorization - Number of Visits  3    PT Start Time  0834    PT Stop Time  0914    PT Time Calculation (min)  40 min    Activity Tolerance  Patient tolerated treatment well;No increased pain    Behavior During Therapy  WFL for tasks assessed/performed       Past Medical History:  Diagnosis Date  . Anemia   . Anxiety   . Depression   . Pregnancy induced hypertension     Past Surgical History:  Procedure Laterality Date  . CESAREAN SECTION N/A 08/18/2016   Procedure: CESAREAN SECTION;  Surgeon: Genia Del, MD;  Location: [facility];  Service: Obstetrics;  Laterality: N/A;  . CESAREAN SECTION N/A 03/07/2019   Procedure: CESAREAN SECTION;  Surgeon: Osborn Coho, MD;  Location: [facility] LD [facility]S;  Service: Obstetrics;  Laterality: N/A;    There were no vitals filed for this visit.  Subjective Assessment - 03/14/20 0836    Subjective  WHile camping I have to urinate 3 times at night right after laying down for bed. The urge gets trong. I urinate just a little. I drink 2-5 cups of coffee per day.    Patient Stated Goals  not have to use pantyliners, reduce leakage, feel less self concious about the leakage    Currently in Pain?  Yes    Pain Score  3     Pain Location  Coccyx    Pain Orientation  Mid    Pain Descriptors / Indicators  Dull    Pain Type  Acute pain    Pain Onset  1 to 4 weeks ago    Pain Frequency  Intermittent     Aggravating Factors   sitting, getting out of a chair    Pain Relieving Factors  chiropractor, Ibuprofen    Multiple Pain Sites  No         [facility] PT Assessment - 03/14/20 0001      Assessment   Medical Diagnosis  R32 Unspeicied urinary incontinence    Referring Provider (PT)  Dr. Hoover Browns    Onset Date/Surgical Date  08/18/16    Prior Therapy  none      Precautions   Precautions  None    Precaution Comments  breast feeding      Restrictions   Weight Bearing Restrictions  No      Home Environment   Living Environment  Private residence      Prior Function   Level of Independence  Independent    Vocation  Part time employment    Biochemist, clinical reception- walking, carrying big bags of dog food    Leisure  goes to [employer] and me baby class, walks in the park      Cognition   Overall Cognitive Status  Within Functional Limits for tasks assessed  AROM   Overall AROM Comments  lumbar ROM is full      Strength   Right Hip ABduction  5/5    Left Hip ABduction  5/5      Palpation   Patella mobility  rib cage angle 90 degrees    SI assessment   ASIS is equal    Palpation comment  left hip adductor                 Pelvic Floor Special Questions - 03/14/20 0001    Pad use  3-4 panty liners    Activities that cause leaking  Other   all day   Urinary urgency  Yes   after goes to bed   Urinary frequency  goes 3-5 times at night    Prolapse other  when patient beared down there was a desend fo the urteine or prolapse by grade 1 but hard to tell structure at this time    Strength  fair squeeze, definite lift        [facility] Adult PT Treatment/Exercise - 03/14/20 0001      Self-Care   Self-Care  Other Self-Care Comments    Other Self-Care Comments   instructed patient on bladder irritants and urge to void      Lumbar Exercises: Supine   Ab Set  10 reps;5 seconds    AB Set Limitations  with pelvic floor contraction and head lift focusing  on not bearing down    Pelvic Tilt  10 reps    Other Supine Lumbar Exercises  supine pelvic floor contraction with abdominal bracing with alternate shoulder flexion      Shoulder Exercises: Supine   Horizontal ABduction  Strengthening;Both;10 reps;Theraband    Theraband Level (Shoulder Horizontal ABduction)  Level 2 (Red)    Horizontal ABduction Limitations  ball squeeze, pelvic floor contraction, abdominal bracind      Manual Therapy   Manual Therapy  Taping    Kinesiotex  Facilitate Muscle      Kinesiotix   Facilitate Muscle   transverse abdominus to bring diastasis together             PT Education - 03/14/20 0915    Education Details  Access Code: [email or web address]; urge to void, bladder irritants    Person(s) Educated  Patient    Methods  Explanation;Demonstration;Verbal cues;Handout    Comprehension  Returned demonstration;Verbalized understanding       PT Short Term Goals - 03/07/20 1144      PT SHORT TERM GOAL #1   Title  independent with initial HEP    Time  4    Period  Weeks    Status  Achieved    Target Date  03/21/20        PT Long Term Goals - 03/14/20 0855      PT LONG TERM GOAL #1   Title  independent with advanced HEP    Baseline  learning new exercises as she progresses    Time  12    Period  Weeks    Status  On-going      PT LONG TERM GOAL #2   Title  coccyx pain going from sit to stand and sitting </- 0-1/10    Baseline  pain level is 3/10    Period  Weeks    Status  On-going      PT LONG TERM GOAL #3   Title  urinary leakage decreased >/= 80% due to pelvic floor strength >/=  4/5 with circular contraction and lift    Baseline  pelvic floor strength is 3/5; improved by 25%    Period  Weeks    Status  On-going      PT LONG TERM GOAL #4   Baseline  diatasis is 3 finger width above the umbilicus, when contract the abdomen she will dome the midling    Time  12    Period  Weeks    Status  On-going            Plan - 03/14/20 0102     Clinical Impression Statement  Patient has increased bilateral hip abduction to 5/5. Urinary leakage has improved by 25%. She is wearing 3-4 pantyliners per day. She wakes up 2-5 times per night due to the urge to void. Patient is drinking 5 cups of coffee per day and was instructed on blader irritants and how they affect the bladder. Patient continues to dome her abdominals with abdominal exercises so we taped the muscles to facilitate them. Patient reb cage angle has decreased to 90 degrees. Patient coccyx pain hs decreased to 3/10 compared to 5/10. Patient will benefit from skilled therapy to improve pelvic floor coordination and reduce the coccyx pain.    Personal Factors and Comorbidities  Comorbidity 1    Comorbidities  C-section 08/18/2016, 03/07/2019    Examination-Activity Limitations  Continence;Sit    Examination-Participation Restrictions  Community Activity;Interpersonal Relationship    Stability/Clinical Decision Making  Stable/Uncomplicated    Rehab Potential  Excellent    PT Frequency  1x / week    PT Duration  12 weeks    PT Treatment/Interventions  Cryotherapy;Electrical Stimulation;Moist Heat;Therapeutic exercise;Therapeutic activities;Neuromuscular re-education;Ultrasound;Patient/family education;Manual techniques;Dry needling;Spinal Manipulations    PT Next Visit Plan  lifting with breath to reduce leakage,  pelvic floor contraction with leg movement and bridge, quadruped, see if the tape is helping    PT Home Exercise Plan  Access Code: [email or web address]    Recommended Other Services  sent Medicaid renewal    Consulted and Agree with Plan of Care  Patient       Patient will benefit from skilled therapeutic intervention in order to improve the following deficits and impairments:  Decreased activity tolerance, Decreased strength, Increased fascial restricitons, Pain, Increased muscle spasms  Visit Diagnosis: Muscle weakness (generalized)  Unspecified lack of coordination  Coccyx  pain     Problem List Patient Active Problem List   Diagnosis Date Noted  . Elevated TSH 08/31/2019  . Hypothyroidism 08/31/2019  . MDD (major depressive disorder), severe ([place]) 08/31/2019  . Gestational hypertension affecting second pregnancy 03/06/2019  . Depression 03/05/2019  . Gestational hypertension 03/05/2019  . History of cesarean delivery affecting pregnancy 03/05/2019  . Desires VBAC (vaginal birth after cesarean) trial 03/05/2019  . Acute blood loss anemia 08/19/2016  . Cesarean delivery delivered 08/18/2016  . Postpartum care following cesarean delivery (11/12) Indication: arrest of dilation 08/18/2016  . Binge eating disorder 12/12/2015  . Anxiety 05/11/2015  . Morbid obesity with BMI of 45.0-49.9, adult [facility]) 05/11/2015    Hannah Acosta, PT 03/14/20 9:21 AM   [place] at [facility] [address] [place], [place], [address] Phone: [phone]   Fax:  [phone]  Name: Hannah Acosta MRN: [id number] Date of Birth: 04-26-1986

## 2020-03-14 NOTE — Patient Instructions (Addendum)
Certain foods and liquids will decrease the pH making the urine more acidic.  Urinary urgency increases when the urine has a low pH.  Most common irritants: alcohol, carbonated beverages and caffinated beverages.  Foods to avoid: apple juice, apples, ascorbic acid, canteloupes, chili, citrus fruits, coffee, cranberries, grapes, guava, peaches, pepper, pineapple, plums, strawberries, tea, tomatoes, and vinegar.  Drinking plenty of water may help to increase the pH and dilute out any of the effects of specific irritants.  Foods that are NOT irritating to the bladder include: Pears, papayas, sun-brewed teas, watermelons, non-citrus herbal teas, apricots, kava and low-acid instant drinks (Postum)  Relaxation Exercises with the Urge to Void   When you experience an urge to void:  FIRST  Stop and stand very still    Sit down if you can    Don't move    You need to stay very still to maintain control  SECOND Squeeze your pelvic floor muscles 5 times, like a quick flick, to keep from leaking  THIRD Relax  Take a deep breath and then let it out  Try to make the urge go away by using relaxation and visualization techniques  FINALLY When you feel the urge go away somewhat, walk normally to the bathroom.   If the urge gets suddenly stronger on the way, you may stop again and relax to regain control.  https://fit2b.us/how-to-use-kinesiology-tape-diastasis-recti/  [facility] [address] [place], [place] [address] Phone # [phone] Fax [phone]  Access Code: [email or web address] URL: [email or web address] Date: 03/14/2020 Prepared by: Eulis Foster  Exercises Cat-Camel to Child's Pose - 1 x daily - 7 x weekly - 1 sets - 10 reps - 30 sec hold Supine Hamstring Stretch with Strap - 1 x daily - 7 x weekly - 1 sets - 2 reps - 30 sec hold Hip Adductors and Hamstring Stretch with Strap - 1 x daily - 7 x weekly - 1 sets - 2 reps - 30 sec hold Supine Pelvic  Floor Stretch - 1 x daily - 7 x weekly - 1 sets - 1 reps - 30 sec hold Supine Transversus Abdominis Bracing - Hands on Stomach - 3 x daily - 7 x weekly - 5 reps - 5 sec hold Supine Alternating Shoulder Flexion - 1 x daily - 7 x weekly - 3 sets - 4 reps Seated Pelvic Floor Contraction - 2 x daily - 7 x weekly - 1 sets - 5 reps - 4 sec hold Supine Shoulder Horizontal Abduction with Resistance - 1 x daily - 7 x weekly - 3 sets - 10 reps

## 2020-03-21 ENCOUNTER — Encounter: Payer: Self-pay | Admitting: Physical Therapy

## 2020-03-21 ENCOUNTER — Other Ambulatory Visit: Payer: Self-pay

## 2020-03-21 ENCOUNTER — Encounter: Payer: Medicaid Other | Admitting: Physical Therapy

## 2020-03-21 DIAGNOSIS — M6281 Muscle weakness (generalized): Secondary | ICD-10-CM

## 2020-03-21 DIAGNOSIS — M533 Sacrococcygeal disorders, not elsewhere classified: Secondary | ICD-10-CM

## 2020-03-21 DIAGNOSIS — R279 Unspecified lack of coordination: Secondary | ICD-10-CM

## 2020-03-21 NOTE — Therapy (Signed)
[place] at [facility] [address] [place], [place], [address] Phone: [phone]   Fax:  [phone]  Physical Therapy Treatment  Patient Details  Name: Hannah Acosta MRN: [id number] Date of Birth: July 11, 1986 Referring Provider (PT): Dr. Waymon Amato   Encounter Date: 03/21/2020   PT End of Session - 03/21/20 1147    Visit Number 5    Date for PT Re-Evaluation 05/16/20    Authorization Type Medicaid    Authorization Time Period 03/21/2020-06/12/2020    Authorization - Visit Number 1    Authorization - Number of Visits 12    PT Start Time 0539    PT Stop Time 1215    PT Time Calculation (min) 42 min    Activity Tolerance Patient tolerated treatment well;No increased pain    Behavior During Therapy WFL for tasks assessed/performed           Past Medical History:  Diagnosis Date  . Anemia   . Anxiety   . Depression   . Pregnancy induced hypertension     Past Surgical History:  Procedure Laterality Date  . CESAREAN SECTION N/A 08/18/2016   Procedure: CESAREAN SECTION;  Surgeon: Princess Bruins, MD;  Location: [place];  Service: Obstetrics;  Laterality: N/A;  . CESAREAN SECTION N/A 03/07/2019   Procedure: CESAREAN SECTION;  Surgeon: Everett Graff, MD;  Location: [place] LD ORS;  Service: Obstetrics;  Laterality: N/A;    There were no vitals filed for this visit.   Subjective Assessment - 03/21/20 1136    Subjective I had irritation from the tape so I kept it off. I cut back on my coffee and has helped the urge. I have tried the exericse in bed at night and helps. I will go at 11:00 then 5:00 in the morning. I had a coccyx flare-up this week. Pain is not constant.    Patient Stated Goals not have to use pantyliners, reduce leakage, feel less self concious about the leakage    Currently in Pain? Yes    Pain Score 4     Pain Location Coccyx    Pain Orientation Mid    Pain Descriptors / Indicators Dull    Pain  Type Acute pain    Pain Onset 1 to 4 weeks ago    Pain Frequency Intermittent    Aggravating Factors  getting out of a chair    Pain Relieving Factors chiropractor, ibuprofen                             [facility] Adult PT Treatment/Exercise - 03/21/20 0001      Therapeutic Activites    Therapeutic Activities Other Therapeutic Activities    Other Therapeutic Activities sit with a u shaped towel with opening at the coccyx  and making sure feet touch the ground; standing with lumbar neutral to reduce the stress on the thoracoc lumbar junction and able to engage the abdominals more      Lumbar Exercises: Supine   Ab Set 10 reps;5 seconds    AB Set Limitations with pelvic floor contraction and head lift focusing on not bearing down    Other Supine Lumbar Exercises supine pelvic floor contraction with abdominal bracing with alternate shoulder flexion keeping back flat      Knee/Hip Exercises: Standing   Side Lunges Right;Left;1 set;10 reps    Side Lunges Limitations keeping spinal neutral and engaging the abdominals  Functional Squat 1 set;15 reps    Functional Squat Limitations reducing the thoracic lumbar extension with engaging the abdominals more and not letting the toes go over the knees    Other Standing Knee Exercises hands on wall to work on lunges and squats to increase hip flexion and reduce lumbar extension to have spinal neutral    Other Standing Knee Exercises stand hip extension with lesss lumbar extension but is not able to do yet      Manual Therapy   Manual Therapy Joint mobilization;Soft tissue mobilization    Joint Mobilization PA and rotational mobilization to T4-T12 and L5; lateral rib cage mobilzation to open up    Soft tissue mobilization right intercoastals in left sidely, right quadratus, right SI joint, right side of coccyx. bilateral serratus anterior                  PT Education - 03/21/20 1254    Education Details how to squat and  lunge correctly; standing posture to reduce thoraco-lumbar extension    Person(s) Educated Patient    Methods Explanation;Demonstration    Comprehension Verbalized understanding;Returned demonstration            PT Short Term Goals - 03/07/20 1144      PT SHORT TERM GOAL #1   Title independent with initial HEP    Time 4    Period Weeks    Status Achieved    Target Date 03/21/20             PT Long Term Goals - 03/14/20 0855      PT LONG TERM GOAL #1   Title independent with advanced HEP    Baseline learning new exercises as she progresses    Time 12    Period Weeks    Status On-going      PT LONG TERM GOAL #2   Title coccyx pain going from sit to stand and sitting </- 0-1/10    Baseline pain level is 3/10    Period Weeks    Status On-going      PT LONG TERM GOAL #3   Title urinary leakage decreased >/= 80% due to pelvic floor strength >/= 4/5 with circular contraction and lift    Baseline pelvic floor strength is 3/5; improved by 25%    Period Weeks    Status On-going      PT LONG TERM GOAL #4   Baseline diatasis is 3 finger width above the umbilicus, when contract the abdomen she will dome the midling    Time 12    Period Weeks    Status On-going                 Plan - 03/21/20 1148    Clinical Impression Statement Patient has cut back on her caffiene. Patient will wake up in the middle of the night but uses her urge to void exercise and does not get up to go to the bathroom. Patient will go to the bathroom at 11:00 PM then 5:00 AM now. Coccyx pain is less intense and frequent. Patient reports her pads are not as wet. Patient skin was irritated by the kinesiotape so did not place on. Patient will over extend at the thoraco-lumbar junction increasing the coccyx pain and rib tightness with difficulty engaging the abdominals. Patient did not leak at all during therapy. At the end of treatment she was able to feel fatque in her abdominals. Patient will benefit  from skilled therapy  to improv epelvic floor coordination and reduce the coccyx pain.    Personal Factors and Comorbidities Comorbidity 1    Comorbidities C-section 08/18/2016, 03/07/2019    Examination-Activity Limitations Continence;Sit    Examination-Participation Restrictions Community Activity;Interpersonal Relationship    Stability/Clinical Decision Making Stable/Uncomplicated    Rehab Potential Excellent    PT Frequency 1x / week    PT Duration 12 weeks    PT Treatment/Interventions Cryotherapy;Electrical Stimulation;Moist Heat;Therapeutic exercise;Therapeutic activities;Neuromuscular re-education;Ultrasound;Patient/family education;Manual techniques;Dry needling;Spinal Manipulations    PT Next Visit Plan continue to work on her exercise technique while in the baby and me class; Assess pelvic floor strength    PT Home Exercise Plan Access Code: [email or web address]    Consulted and Agree with Plan of Care Patient           Patient will benefit from skilled therapeutic intervention in order to improve the following deficits and impairments:  Decreased activity tolerance, Decreased strength, Increased fascial restricitons, Pain, Increased muscle spasms  Visit Diagnosis: Muscle weakness (generalized)  Unspecified lack of coordination  Coccyx pain     Problem List Patient Active Problem List   Diagnosis Date Noted  . Elevated TSH 08/31/2019  . Hypothyroidism 08/31/2019  . MDD (major depressive disorder), severe ([facility]) 08/31/2019  . Gestational hypertension affecting second pregnancy 03/06/2019  . Depression 03/05/2019  . Gestational hypertension 03/05/2019  . History of cesarean delivery affecting pregnancy 03/05/2019  . Desires VBAC (vaginal birth after cesarean) trial 03/05/2019  . Acute blood loss anemia 08/19/2016  . Cesarean delivery delivered 08/18/2016  . Postpartum care following cesarean delivery (11/12) Indication: arrest of dilation 08/18/2016  . Binge eating disorder  12/12/2015  . Anxiety 05/11/2015  . Morbid obesity with BMI of 45.0-49.9, adult [facility]) 05/11/2015    Eulis Foster, PT 03/21/20 1:04 PM   [place] [facility] at [facility] [address] [place], [place], [address] Phone: [phone]   Fax:  [phone]  Name: DESSIE TATEM MRN: [id number] Date of Birth: 22-May-1986

## 2020-03-28 ENCOUNTER — Other Ambulatory Visit: Payer: Self-pay

## 2020-03-28 ENCOUNTER — Encounter: Payer: Medicaid Other | Admitting: Physical Therapy

## 2020-03-28 ENCOUNTER — Encounter: Payer: Self-pay | Admitting: Physical Therapy

## 2020-03-28 DIAGNOSIS — M533 Sacrococcygeal disorders, not elsewhere classified: Secondary | ICD-10-CM

## 2020-03-28 DIAGNOSIS — R279 Unspecified lack of coordination: Secondary | ICD-10-CM

## 2020-03-28 DIAGNOSIS — M6281 Muscle weakness (generalized): Secondary | ICD-10-CM

## 2020-03-28 NOTE — Therapy (Signed)
[facility] [address] [place], [place], [address] Phone: [phone]   Fax:  [phone]  Physical Therapy Treatment  Patient Details  Name: Hannah Acosta MRN: [id number] Date of Birth: 1986/03/12 Referring Provider (PT): Dr. Hoover Browns   Encounter Date: 03/28/2020   PT End of Session - 03/28/20 1309    Visit Number 6    Date for PT Re-Evaluation 05/16/20    Authorization Type Medicaid    Authorization Time Period 03/21/2020-06/12/2020    Authorization - Visit Number 2    Authorization - Number of Visits 12    PT Start Time 1302    PT Stop Time 1345    PT Time Calculation (min) 43 min    Activity Tolerance Patient tolerated treatment well;No increased pain    Behavior During Therapy WFL for tasks assessed/performed           Past Medical History:  Diagnosis Date  . Anemia   . Anxiety   . Depression   . Pregnancy induced hypertension     Past Surgical History:  Procedure Laterality Date  . CESAREAN SECTION N/A 08/18/2016   Procedure: CESAREAN SECTION;  Surgeon: Genia Del, MD;  Location: [facility] BIRTHING SUITES;  Service: Obstetrics;  Laterality: N/A;  . CESAREAN SECTION N/A 03/07/2019   Procedure: CESAREAN SECTION;  Surgeon: Osborn Coho, MD;  Location: [facility] LD [facility]S;  Service: Obstetrics;  Laterality: N/A;    There were no vitals filed for this visit.   Subjective Assessment - 03/28/20 1307    Subjective Coccyx pain has been goodthis week. I have been modifying my exercises in class. I feel like I am working my core better.    Patient Stated Goals not have to use pantyliners, reduce leakage, feel less self concious about the leakage    Currently in Pain? No/denies                          Pelvic Floor Special Questions - 03/28/20 0001    Diastasis Recti 2 fingers width above umilicus    Pad use 3-4 panty liners             [facility] Adult PT Treatment/Exercise - 03/28/20 0001       Lumbar Exercises: Supine   Ab Set 10 reps;5 seconds    AB Set Limitations keeping the back flat on mat to engage the abdominals and bring the rib cage downward    Other Supine Lumbar Exercises tricep push up on mat    Other Supine Lumbar Exercises bridge wtih one leg in and out with pelvic control then bridge with heel raise      Lumbar Exercises: Quadruped   Madcat/Old Horse 10 reps    Madcat/Old Horse Limitations tactile cues to open up the back to stretch the thoracolumbar junction and engage the lower abdominals    Other Quadruped Lumbar Exercises side plank on elbow and knees wiht tactile cues to engage core correctly      Knee/Hip Exercises: Standing   Other Standing Knee Exercises try modified PLANK on wall or mat with adjustment ; mountain climbers on mat; toe tap on 8 inch step with core engaged and decreased trunk sway' wall push up on knees to work on core better; jumping jacks with berath; running with good posture                  PT Education - 03/28/20 1350  Education Details went over her exercise class exercises and went over how to modify; supine to contract the upper and lower abdominals correctly    Person(s) Educated Patient    Methods Explanation;Demonstration    Comprehension Verbalized understanding;Returned demonstration            PT Short Term Goals - 03/07/20 1144      PT SHORT TERM GOAL #1   Title independent with initial HEP    Time 4    Period Weeks    Status Achieved    Target Date 03/21/20             PT Long Term Goals - 03/28/20 1323      PT LONG TERM GOAL #1   Title independent with advanced HEP    Baseline learning new exercises as she progresses    Time 12    Period Weeks    Status On-going      PT LONG TERM GOAL #2   Title coccyx pain going from sit to stand and sitting </- 0-1/10    Baseline pain level is 3/10    Time 12    Period Weeks    Status On-going      PT LONG TERM GOAL #3   Title urinary leakage  decreased >/= 80% due to pelvic floor strength >/= 4/5 with circular contraction and lift    Baseline pelvic floor strength is 3/5; improved by 25%    Period Weeks    Status On-going      PT LONG TERM GOAL #4   Title able to contract the abdominals without doming due to improve abdominal contraction and reduction of diastasis recti with improved tenstion generated    Baseline diatasis is 3 finger width above the umbilicus, when contract the abdomen she will dome the midling    Period Weeks    Status On-going                 Plan - 03/28/20 1351    Clinical Impression Statement Patient reports no coccyx pain today. She feels her pads are dryer but still have a urine smell. She understands how to modify her exercises in her exercise class so she is working her core correctly. She continues to need tactile and verbal cues to engage the ower and upper abdominals. She continues to push her stomach out while bearing down on bladder to cause some urinary leakage. Patient still has 2 fingers width diastasis recti above the umbilicus but this will improve as she contracts the abdominals correctly. Patient will benefit from skilled therapy to improve pelvic floor coordination and reduce the coccyx pain.    Personal Factors and Comorbidities Comorbidity 1    Comorbidities C-section 08/18/2016, 03/07/2019    Examination-Activity Limitations Continence;Sit    Examination-Participation Restrictions Community Activity;Interpersonal Relationship    Stability/Clinical Decision Making Stable/Uncomplicated    Rehab Potential Excellent    PT Frequency 1x / week    PT Duration 12 weeks    PT Treatment/Interventions Cryotherapy;Electrical Stimulation;Moist Heat;Therapeutic exercise;Therapeutic activities;Neuromuscular re-education;Ultrasound;Patient/family education;Manual techniques;Dry needling;Spinal Manipulations    PT Next Visit Plan assess pelvic floor strength, see if she is contracting the upper and  lower rib cage; see if ready for advanced exercises; pallof press in lunge    PT Home Exercise Plan Access Code: [email or web address]    Consulted and Agree with Plan of Care Patient           Patient will benefit from skilled therapeutic intervention in order  to improve the following deficits and impairments:  Decreased activity tolerance, Decreased strength, Increased fascial restricitons, Pain, Increased muscle spasms  Visit Diagnosis: Muscle weakness (generalized)  Unspecified lack of coordination  Coccyx pain     Problem List Patient Active Problem List   Diagnosis Date Noted  . Elevated TSH 08/31/2019  . Hypothyroidism 08/31/2019  . MDD (major depressive disorder), severe (HCC) 08/31/2019  . Gestational hypertension affecting second pregnancy 03/06/2019  . Depression 03/05/2019  . Gestational hypertension 03/05/2019  . History of cesarean delivery affecting pregnancy 03/05/2019  . Desires VBAC (vaginal birth after cesarean) trial 03/05/2019  . Acute blood loss anemia 08/19/2016  . Cesarean delivery delivered 08/18/2016  . Postpartum care following cesarean delivery (11/12) Indication: arrest of dilation 08/18/2016  . Binge eating disorder 12/12/2015  . Anxiety 05/11/2015  . Morbid obesity with BMI of 45.0-49.9, adult [facility]) 05/11/2015    Eulis Foster, PT 03/28/20 1:57 PM   [place] [facility] at [facility] [address], [address] [place], [place], [address] Phone: [phone]   Fax:  [phone]  Name: Hannah Acosta MRN: [id number] Date of Birth: April 23, 1986

## 2020-04-11 ENCOUNTER — Encounter: Payer: Self-pay | Admitting: Physical Therapy

## 2020-04-11 ENCOUNTER — Other Ambulatory Visit: Payer: Self-pay

## 2020-04-11 ENCOUNTER — Telehealth: Payer: Self-pay | Admitting: Physical Therapy

## 2020-04-11 ENCOUNTER — Encounter: Payer: Medicaid Other | Attending: Obstetrics & Gynecology | Admitting: Physical Therapy

## 2020-04-11 ENCOUNTER — Encounter: Payer: Medicaid Other | Admitting: Physical Therapy

## 2020-04-11 DIAGNOSIS — R279 Unspecified lack of coordination: Secondary | ICD-10-CM | POA: Diagnosis not present

## 2020-04-11 DIAGNOSIS — M533 Sacrococcygeal disorders, not elsewhere classified: Secondary | ICD-10-CM | POA: Insufficient documentation

## 2020-04-11 DIAGNOSIS — M6281 Muscle weakness (generalized): Secondary | ICD-10-CM | POA: Diagnosis not present

## 2020-04-11 NOTE — Telephone Encounter (Signed)
Called patient and left a message about her missing her appointment on 04/11/2020 at 11:30.  Eulis Foster, PT @7 /03/2020@ 11:55 AM

## 2020-04-11 NOTE — Therapy (Signed)
[facility] at [facility] [address], [address] [place], [place], [address] Phone: [phone]   Fax:  [phone]  Physical Therapy Treatment  Patient Details  Name: Hannah Acosta MRN: [id number] Date of Birth: 11/16/1985 Referring Provider (PT): Dr. Hoover Browns   Encounter Date: 04/11/2020   PT End of Session - 04/11/20 1311    Visit Number 7    Date for PT Re-Evaluation 05/16/20    Authorization Type Medicaid    Authorization Time Period 03/21/2020-06/12/2020    Authorization - Visit Number 3    Authorization - Number of Visits 12    PT Start Time 1309    PT Stop Time 1350    PT Time Calculation (min) 41 min    Activity Tolerance Patient tolerated treatment well;No increased pain    Behavior During Therapy WFL for tasks assessed/performed           Past Medical History:  Diagnosis Date  . Anemia   . Anxiety   . Depression   . Pregnancy induced hypertension     Past Surgical History:  Procedure Laterality Date  . CESAREAN SECTION N/A 08/18/2016   Procedure: CESAREAN SECTION;  Surgeon: Genia Del, MD;  Location: [facility] BIRTHING SUITES;  Service: Obstetrics;  Laterality: N/A;  . CESAREAN SECTION N/A 03/07/2019   Procedure: CESAREAN SECTION;  Surgeon: Osborn Coho, MD;  Location: [facility];  Service: Obstetrics;  Laterality: N/A;    There were no vitals filed for this visit.   Subjective Assessment - 04/11/20 1313    Subjective I am getting up for the bathroom 1 time at night compared to 3-4 times. The exercise modifications in my class help me feel more in control. Till wearing the same amonut of panty linerw with a urine oder but wear due to still spotting. The panty liners are dryer than they used to.  I had some coccyx pain with riding in the car alot.    Patient Stated Goals not have to use pantyliners, reduce leakage, feel less self concious about the leakage    Currently in Pain? Yes    Pain Score 4     Pain Location  Coccyx    Pain Orientation Mid    Pain Descriptors / Indicators Dull    Pain Type Acute pain    Pain Onset 1 to 4 weeks ago    Pain Frequency Intermittent    Aggravating Factors  sitting    Pain Relieving Factors chiropractor, sit with feet on floor                             [facility] Adult PT Treatment/Exercise - 04/11/20 0001      Posture/Postural Control   Posture Comments educated patient on upright posture  with pushing feet into the floor and elongate  the spine to reduce the stress on the lumbar spine      Exercises   Exercises Other Exercises    Other Exercises  prone breathing into her bacl to elongate the posterior rib cage      Lumbar Exercises: Supine   Ab Set 5 reps;5 seconds    AB Set Limitations good abdominal engagement    Bent Knee Raise 20 reps;1 second    Bent Knee Raise Limitations with good abdominal control      Manual Therapy   Manual Therapy Joint mobilization;Soft tissue mobilization    Joint Mobilization Pa and rotational mobilization to  T4-L3    Soft tissue mobilization using a suction cup to lift the fascial off the muscle of the back; suing the addaday to the thoracolumbar paraspinals                  PT Education - 04/11/20 1400    Education Details Access Code: [email or web address]; postural correction    Person(s) Educated Patient    Methods Explanation;Demonstration;Verbal cues;Handout    Comprehension Verbalized understanding;Returned demonstration            PT Short Term Goals - 03/07/20 1144      PT SHORT TERM GOAL #1   Title independent with initial HEP    Time 4    Period Weeks    Status Achieved    Target Date 03/21/20             PT Long Term Goals - 04/11/20 1403      PT LONG TERM GOAL #1   Title independent with advanced HEP    Baseline learning new exercises as she progresses    Time 12    Period Weeks      PT LONG TERM GOAL #2   Title coccyx pain going from sit to stand and sitting </- 0-1/10     Baseline pain level is 3/10    Time 12    Period Weeks    Status On-going      PT LONG TERM GOAL #3   Title urinary leakage decreased >/= 80% due to pelvic floor strength >/= 4/5 with circular contraction and lift    Baseline pelvic floor strength is 3/5; improved by 25%    Time 12    Status On-going      PT LONG TERM GOAL #4   Baseline diatasis is 3 finger width above the umbilicus, when contract the abdomen she will dome the midling    Time 12    Period Weeks    Status On-going                 Plan - 04/11/20 1312    Clinical Impression Statement Patient is only getting up 1 time at night now. Her pads are not as wet. Patient is now able to engage her abdominals correctly with her exercise. She understands on how to correct her posture in standing and lift her trunk. Patient continues to have some tightness in the thoracolumbar area. She is breathing into the posterior rib cage to elongate the spine. Patietn is not feeling the coccyx pain with sit to stand just in sitting. Patient will benefit from skilled therapy to improve pelvic floor coordination and reduce the coccyx pain.    Personal Factors and Comorbidities Comorbidity 1    Comorbidities C-section 08/18/2016, 03/07/2019    Examination-Activity Limitations Continence;Sit    Examination-Participation Restrictions Community Activity;Interpersonal Relationship    Stability/Clinical Decision Making Stable/Uncomplicated    Rehab Potential Excellent    PT Frequency 1x / week    PT Duration 12 weeks    PT Treatment/Interventions Cryotherapy;Electrical Stimulation;Moist Heat;Therapeutic exercise;Therapeutic activities;Neuromuscular re-education;Ultrasound;Patient/family education;Manual techniques;Dry needling;Spinal Manipulations    PT Next Visit Plan pallof in 1/2 stand    PT Home Exercise Plan Access Code: [email or web address]    Consulted and Agree with Plan of Care Patient           Patient will benefit from skilled  therapeutic intervention in order to improve the following deficits and impairments:  Decreased activity tolerance, Decreased strength, Increased fascial restricitons, Pain, Increased muscle  spasms  Visit Diagnosis: Muscle weakness (generalized)  Unspecified lack of coordination  Coccyx pain     Problem List Patient Active Problem List   Diagnosis Date Noted  . Elevated TSH 08/31/2019  . Hypothyroidism 08/31/2019  . MDD (major depressive disorder), severe (HCC) 08/31/2019  . Gestational hypertension affecting second pregnancy 03/06/2019  . Depression 03/05/2019  . Gestational hypertension 03/05/2019  . History of cesarean delivery affecting pregnancy 03/05/2019  . Desires VBAC (vaginal birth after cesarean) trial 03/05/2019  . Acute blood loss anemia 08/19/2016  . Cesarean delivery delivered 08/18/2016  . Postpartum care following cesarean delivery (11/12) Indication: arrest of dilation 08/18/2016  . Binge eating disorder 12/12/2015  . Anxiety 05/11/2015  . Morbid obesity with BMI of 45.0-49.9, adult [facility]) 05/11/2015    Eulis Foster, PT 04/11/20 2:05 PM   [place] [facility] at [facility] [address] [place], [place], [address] Phone: [phone]   Fax:  [phone]  Name: Hannah Acosta MRN: [id number] Date of Birth: Oct 09, 1985

## 2020-04-11 NOTE — Patient Instructions (Signed)
Access Code: [email or web address] URL: [email or web address] Date: 04/11/2020 Prepared by: Eulis Foster  Exercises Cat-Camel to Child's Pose - 1 x daily - 7 x weekly - 1 sets - 10 reps - 30 sec hold Supine Hamstring Stretch with Strap - 1 x daily - 7 x weekly - 1 sets - 2 reps - 30 sec hold Hip Adductors and Hamstring Stretch with Strap - 1 x daily - 7 x weekly - 1 sets - 2 reps - 30 sec hold Supine Pelvic Floor Stretch - 1 x daily - 7 x weekly - 1 sets - 1 reps - 30 sec hold Supine Transversus Abdominis Bracing - Hands on Stomach - 3 x daily - 7 x weekly - 5 reps - 5 sec hold Supine Alternating Shoulder Flexion - 1 x daily - 7 x weekly - 1 sets - 10 reps Seated Pelvic Floor Contraction - 2 x daily - 7 x weekly - 1 sets - 5 reps - 4 sec hold Supine Shoulder Horizontal Abduction with Resistance - 1 x daily - 7 x weekly - 3 sets - 10 reps Supine March with Posterior Pelvic Tilt - 1 x daily - 7 x weekly - 3 sets - 10 reps [facility] [address] [place], [place] [address] Phone # [phone] Fax [phone]

## 2020-04-25 ENCOUNTER — Encounter: Payer: Self-pay | Admitting: Physical Therapy

## 2020-04-25 ENCOUNTER — Other Ambulatory Visit: Payer: Self-pay

## 2020-04-25 ENCOUNTER — Encounter: Payer: Medicaid Other | Admitting: Physical Therapy

## 2020-04-25 DIAGNOSIS — R279 Unspecified lack of coordination: Secondary | ICD-10-CM

## 2020-04-25 DIAGNOSIS — M6281 Muscle weakness (generalized): Secondary | ICD-10-CM

## 2020-04-25 DIAGNOSIS — M533 Sacrococcygeal disorders, not elsewhere classified: Secondary | ICD-10-CM

## 2020-04-25 NOTE — Patient Instructions (Signed)
Access Code: [email or web address] URL: [email or web address] Date: 04/25/2020 Prepared by: Eulis Foster  Exercises Quadruped Diaphragmatic Breathing - 1 x daily - 7 x weekly - 3 sets - 10 reps Cat-Camel to Child's Pose - 1 x daily - 7 x weekly - 1 sets - 10 reps - 30 sec hold Quadruped Thoracic Rotation with Hand on Neck - 1 x daily - 7 x weekly - 1 sets - 10 reps Supine Hamstring Stretch with Strap - 1 x daily - 7 x weekly - 1 sets - 2 reps - 30 sec hold Hip Adductors and Hamstring Stretch with Strap - 1 x daily - 7 x weekly - 1 sets - 2 reps - 30 sec hold Supine Pelvic Floor Stretch - 1 x daily - 7 x weekly - 1 sets - 1 reps - 30 sec hold Supine Transversus Abdominis Bracing - Hands on Stomach - 3 x daily - 7 x weekly - 5 reps - 5 sec hold Supine Alternating Shoulder Flexion - 1 x daily - 7 x weekly - 1 sets - 10 reps Seated Pelvic Floor Contraction - 2 x daily - 7 x weekly - 1 sets - 5 reps - 4 sec hold Supine Shoulder Horizontal Abduction with Resistance - 1 x daily - 7 x weekly - 3 sets - 10 reps Supine March with Posterior Pelvic Tilt - 1 x daily - 7 x weekly - 3 sets - 10 reps  Patient Education Trigger Point Dry Needling Eulis Foster, PT [facility] [address] [place], [place] [address] W: [phone] [email or web address]

## 2020-04-25 NOTE — Therapy (Signed)
[facility] at [facility] [address], [address] [place], [place], [address] Phone: [phone]   Fax:  [phone]  Physical Therapy Treatment  Patient Details  Name: Hannah Acosta MRN: [id number] Date of Birth: [birth year] Referring Provider (PT): Dr. Hoover Browns   Encounter Date: 04/25/2020   PT End of Session - 04/25/20 1658    Visit Number 8    Date for PT Re-Evaluation 05/16/20    Authorization Type Medicaid    Authorization Time Period 03/21/2020-06/12/2020    Authorization - Visit Number 4    Authorization - Number of Visits 12    PT Start Time 1400    PT Stop Time 1455    PT Time Calculation (min) 55 min    Activity Tolerance Patient tolerated treatment well;No increased pain    Behavior During Therapy WFL for tasks assessed/performed           Past Medical History:  Diagnosis Date  . Anemia   . Anxiety   . Depression   . Pregnancy induced hypertension     Past Surgical History:  Procedure Laterality Date  . CESAREAN SECTION N/A 08/18/2016   Procedure: CESAREAN SECTION;  Surgeon: Genia Del, MD;  Location: [facility] BIRTHING SUITES;  Service: Obstetrics;  Laterality: N/A;  . CESAREAN SECTION N/A 03/07/2019   Procedure: CESAREAN SECTION;  Surgeon: Osborn Coho, MD;  Location: [facility] LD ORS;  Service: Obstetrics;  Laterality: N/A;    There were no vitals filed for this visit.   Subjective Assessment - 04/25/20 1405    Subjective [place]. My exercise classes are going well.    Patient Stated Goals not have to use pantyliners, reduce leakage, feel less self concious about the leakage    Currently in Pain? No/denies                          Pelvic Floor Special Questions - 04/25/20 0001    Diastasis Recti 2 fingers width above umilicus with good tension    Pad use 2-3 panty liners and 1 at night             [facility] Adult PT Treatment/Exercise - 04/25/20 0001      Lumbar Exercises:  Quadruped   Other Quadruped Lumbar Exercises quadruped thoracic rotation 5x each way      Manual Therapy   Manual Therapy Soft tissue mobilization;Myofascial release    Manual therapy comments assessing the muscles for dry needling    Soft tissue mobilization thoracic lumbar paraspinals, obliques, iliopsoas, and quadratus    Myofascial Release using a suction cup on the tlow thoracic and lumbar area, lateral trunk and abdominals            Trigger Point Dry Needling - 04/25/20 0001    Consent Given? Yes    Education Handout Provided Yes    Muscles Treated Back/Hip Thoracic multifidi;Iliopsoas   T10-T12   Iliopsoas Response Twitch response elicited;Palpable increased muscle length   left   Thoracic multifidi response Twitch response elicited;Palpable increased muscle length   bil.                PT Education - 04/25/20 1458    Education Details Access Code: [email or web address]; information on dry needling    Person(s) Educated Patient    Methods Explanation;Demonstration;Verbal cues;Handout    Comprehension Verbalized understanding;Returned demonstration            PT Short  Term Goals - 03/07/20 1144      PT SHORT TERM GOAL #1   Title independent with initial HEP    Time 4    Period Weeks    Status Achieved    Target Date 03/21/20             PT Long Term Goals - 04/25/20 1410      PT LONG TERM GOAL #1   Title independent with advanced HEP    Baseline learning new exercises as she progresses    Time 12    Period Weeks    Status On-going      PT LONG TERM GOAL #2   Title coccyx pain going from sit to stand and sitting </- 0-1/10    Time 12    Period Weeks    Status Achieved      PT LONG TERM GOAL #3   Title urinary leakage decreased >/= 80% due to pelvic floor strength >/= 4/5 with circular contraction and lift    Baseline pelvic floor strength is 3/5; improved by 40%    Time 12    Period Weeks    Status On-going      PT LONG TERM GOAL #4   Title able to  contract the abdominals without doming due to improve abdominal contraction and reduction of diastasis recti with improved tenstion generated    Time 12    Period Weeks    Status On-going                 Plan - 04/25/20 1419    Clinical Impression Statement Patient has tightness in the rib cage, quadratus, low thoracic area. Patient needed many tactile and verbal cues to open up the rib cage. Patient has tightness in the thoracolumbar junction. Patient is now down to 2-3 pantyliners instead of 3-4. Patient was able to walk through the zoo without difficulty due to increased strength and endurance. Patinet was able to contract the abdominals better after maunual work. Urinary leakage improved by 50%. Patient has not had coccyx pain since last visit. Patient will benefit from skilled therapy to improve pelvic floor coordination and reduce urinary leakage.    Personal Factors and Comorbidities Comorbidity 1    Comorbidities C-section 08/18/2016, 03/07/2019    Examination-Activity Limitations Continence;Sit    Examination-Participation Restrictions Community Activity;Interpersonal Relationship    Stability/Clinical Decision Making Stable/Uncomplicated    Rehab Potential Excellent    PT Frequency 1x / week    PT Duration 12 weeks    PT Treatment/Interventions Cryotherapy;Electrical Stimulation;Moist Heat;Therapeutic exercise;Therapeutic activities;Neuromuscular re-education;Ultrasound;Patient/family education;Manual techniques;Dry needling;Spinal Manipulations    PT Next Visit Plan pallof in 1/2 stand; see if rib cage is moving, see if she needs dry needling, work on pelvic floor contraction    PT Home Exercise Plan Access Code: [email or web address]    Consulted and Agree with Plan of Care Patient           Patient will benefit from skilled therapeutic intervention in order to improve the following deficits and impairments:  Decreased activity tolerance, Decreased strength, Increased fascial  restricitons, Pain, Increased muscle spasms  Visit Diagnosis: Muscle weakness (generalized)  Unspecified lack of coordination  Coccyx pain     Problem List Patient Active Problem List   Diagnosis Date Noted  . Elevated TSH 08/31/2019  . Hypothyroidism 08/31/2019  . MDD (major depressive disorder), severe (HCC) 08/31/2019  . Gestational hypertension affecting second pregnancy 03/06/2019  . Depression 03/05/2019  . Gestational hypertension 03/05/2019  .  History of cesarean delivery affecting pregnancy 03/05/2019  . Desires VBAC (vaginal birth after cesarean) trial 03/05/2019  . Acute blood loss anemia 08/19/2016  . Cesarean delivery delivered 08/18/2016  . Postpartum care following cesarean delivery (11/12) Indication: arrest of dilation 08/18/2016  . Binge eating disorder 12/12/2015  . Anxiety 05/11/2015  . Morbid obesity with BMI of 45.0-49.9, adult [facility]) 05/11/2015    Eulis Foster, PT 04/25/20 5:04 PM   [facility] [facility] at [facility] [address], [address] [place], [place], [address] Phone: [phone]   Fax:  [phone]  Name: Hannah Acosta MRN: [id number] Date of Birth: Jul 27, 1986

## 2020-05-02 ENCOUNTER — Other Ambulatory Visit: Payer: Self-pay

## 2020-05-02 ENCOUNTER — Encounter: Payer: Self-pay | Admitting: Physical Therapy

## 2020-05-02 ENCOUNTER — Encounter: Payer: Medicaid Other | Admitting: Physical Therapy

## 2020-05-02 DIAGNOSIS — M533 Sacrococcygeal disorders, not elsewhere classified: Secondary | ICD-10-CM

## 2020-05-02 DIAGNOSIS — R279 Unspecified lack of coordination: Secondary | ICD-10-CM

## 2020-05-02 DIAGNOSIS — M6281 Muscle weakness (generalized): Secondary | ICD-10-CM

## 2020-05-02 NOTE — Patient Instructions (Addendum)
Access Code: [email or web address] URL: [email or web address] Date: 05/02/2020 Prepared by: Eulis Foster  Exercises Quadruped Diaphragmatic Breathing - 1 x daily - 7 x weekly - 3 sets - 10 reps Cat-Camel to Child's Pose - 1 x daily - 7 x weekly - 1 sets - 10 reps - 30 sec hold Quadruped Thoracic Rotation with Hand on Neck - 1 x daily - 7 x weekly - 1 sets - 10 reps Supine Hamstring Stretch with Strap - 1 x daily - 7 x weekly - 1 sets - 2 reps - 30 sec hold Hip Adductors and Hamstring Stretch with Strap - 1 x daily - 7 x weekly - 1 sets - 2 reps - 30 sec hold Supine Pelvic Floor Stretch - 1 x daily - 7 x weekly - 1 sets - 1 reps - 30 sec hold Supine Transversus Abdominis Bracing - Hands on Stomach - 3 x daily - 7 x weekly - 5 reps - 5 sec hold Supine Alternating Shoulder Flexion - 1 x daily - 7 x weekly - 1 sets - 10 reps Seated Pelvic Floor Contraction - 2 x daily - 7 x weekly - 1 sets - 5 reps - 4 sec hold Supine Shoulder Horizontal Abduction with Resistance - 1 x daily - 7 x weekly - 3 sets - 10 reps Supine March with Posterior Pelvic Tilt - 1 x daily - 7 x weekly - 3 sets - 10 reps Child's Pose with Thread the Needle - 1 x daily - 7 x weekly - 3 sets - 10 reps Warrior II - 1 x daily - 7 x weekly - 3 sets - 10 reps Reverse Warrior Pose - 1 x daily - 7 x weekly - 3 sets - 10 reps  Patient Education Trigger Point Dry Needling Eulis Foster, PT [facility] [address], [address] [place], [place] [address] W: [phone] [email or web address]

## 2020-05-02 NOTE — Therapy (Signed)
[facility] at [facility] [address] [place], [place], [address] Phone: [phone]   Fax:  [phone]  Physical Therapy Treatment  Patient Details  Name: Hannah Acosta MRN: [id number] Date of Birth: [birth year] Referring Provider (PT): Dr. Hoover Browns   Encounter Date: 05/02/2020   PT End of Session - 05/02/20 1359    Visit Number 9    Date for PT Re-Evaluation 07/25/20    Authorization Type Medicaid    Authorization Time Period 03/21/2020-06/12/2020    Authorization - Visit Number 5    Authorization - Number of Visits 12    PT Start Time 0830    PT Stop Time 0925    PT Time Calculation (min) 55 min    Activity Tolerance Patient tolerated treatment well;No increased pain    Behavior During Therapy WFL for tasks assessed/performed           Past Medical History:  Diagnosis Date  . Anemia   . Anxiety   . Depression   . Pregnancy induced hypertension     Past Surgical History:  Procedure Laterality Date  . CESAREAN SECTION N/A 08/18/2016   Procedure: CESAREAN SECTION;  Surgeon: Genia Del, MD;  Location: [facility] BIRTHING SUITES;  Service: Obstetrics;  Laterality: N/A;  . CESAREAN SECTION N/A 03/07/2019   Procedure: CESAREAN SECTION;  Surgeon: Osborn Coho, MD;  Location: [facility] LD ORS;  Service: Obstetrics;  Laterality: N/A;    There were no vitals filed for this visit.   Subjective Assessment - 05/02/20 0839    Subjective I have had alot of back pain. I was suppose to get my bloodwork for my medication. I have been doing [place] classes and it has helped me. After exercise class I get real bad chaffing and feels like urine burn.    Patient Stated Goals not have to use pantyliners, reduce leakage, feel less self concious about the leakage    Currently in Pain? Yes    Pain Score 6     Pain Location Back    Pain Orientation Mid    Pain Descriptors / Indicators Dull;Discomfort    Pain Type Acute pain    Pain Onset 1 to 4  weeks ago    Pain Frequency Intermittent    Aggravating Factors  at night    Pain Relieving Factors rest    Multiple Pain Sites No              [facility] PT Assessment - 05/02/20 0001      Assessment   Medical Diagnosis R32 Unspeicied urinary incontinence    Referring Provider (PT) Dr. 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, carrying big bags of dog food    Leisure goes to [employer] and me baby class, walks in the park      Cognition   Overall Cognitive Status Within Functional Limits for tasks assessed      AROM   Lumbar - Right Rotation decreased by 25%      Strength   Right Hip ABduction 5/5  Left Hip ABduction 5/5                      Pelvic Floor Special Questions - 05/02/20 0001    Pad use 2-3 panty liners and 1 at night    Urinary frequency gets up 2 times per night             [facility] Adult PT Treatment/Exercise - 05/02/20 0001      Lumbar Exercises: Sidelying   Other Sidelying Lumbar Exercises sidely with knees and hips to 90/90 with breathing into the back rib cage and when exhale with contract the abdominals to work on rib mobility      Lumbar Exercises: Quadruped   Other Quadruped Lumbar Exercises quadruped thread the needle      Knee/Hip Exercises: Standing   Other Standing Knee Exercises woarrior II pose and reverse warrior pose to open up the lateral rib cage and thoracic area    Other Standing Knee Exercises standing with breathing into the back and not lift the chest up and as she breaths out to contract the abdominals especially the upper with tactile cues      Manual Therapy    Manual Therapy Soft tissue mobilization;Joint mobilization    Manual therapy comments assessing the muscles for dry needling; instructd patient on how to use a tennis ball to massage the multifdi in supin    Joint Mobilization PA and rotational mobilization to T3-L5 grade III    Soft tissue mobilization using the addaday to the thoracic and lumbar multifidi            Trigger Point Dry Needling - 05/02/20 0001    Consent Given? Yes    Education Handout Provided Previously provided    Muscles Treated Back/Hip Thoracic multifidi;Lumbar multifidi;Quadratus lumborum    Lumbar multifidi Response Twitch response elicited;Palpable increased muscle length    Quadratus Lumborum Response Twitch response elicited;Palpable increased muscle length    Thoracic multifidi response Twitch response elicited;Palpable increased muscle length   bil.                PT Education - 05/02/20 1359    Education Details Access Code: [email or web address]    Person(s) Educated Patient    Methods Explanation;Demonstration;Verbal cues;Handout    Comprehension Returned demonstration;Verbalized understanding            PT Short Term Goals - 03/07/20 1144      PT SHORT TERM GOAL #1   Title independent with initial HEP    Time 4    Period Weeks    Status Achieved    Target Date 03/21/20             PT Long Term Goals - 05/02/20 1404      PT LONG TERM GOAL #1   Title independent with advanced HEP    Baseline learning new exercises as she progresses    Time 12    Period Weeks    Status On-going      PT LONG TERM GOAL #2   Title coccyx pain going from sit to stand and sitting </- 0-1/10    Baseline pain level is 3/10    Time 12    Period Weeks    Status Achieved      PT LONG TERM GOAL #3   Title urinary leakage decreased >/= 80% due to pelvic floor strength >/= 4/5 with circular contraction and lift    Baseline pelvic floor strength is 3/5; improved  by 40%    Time 12    Period Weeks    Status  On-going      PT LONG TERM GOAL #4   Title able to contract the abdominals without doming due to improve abdominal contraction and reduction of diastasis recti with improved tenstion generated    Baseline diatasis is 3 finger width above the umbilicus, when contract the abdomen she will dome the midling    Time 12    Period Weeks    Status On-going                 Plan - 05/02/20 0920    Clinical Impression Statement Patient reports less urinary leakage but sstill uses 2-3 pads. Patient is getting up 2 times per night instead of 3. Patient reports back pain when going to sleep. She has alot of tightness in the thoracoclumbar junction and mid thoracic limiting her rib cage mobiltiy. Today we focuse on elongating the thoracic and lumbar area and expanding the low rib cage to move the diaphgram. Patient needs tactile cues to breath into her back and as she breathes out to contract her abdominals. Paitent is exercising regularly with her HEP and goes to an exercise class. Patient has increased endurance with daily activities. Patient will benefti from skilled therapy to imporve pelvic floor coordination and reduce urinary leakage.    Personal Factors and Comorbidities Comorbidity 1    Comorbidities C-section 08/18/2016, 03/07/2019    Examination-Activity Limitations Continence;Sit    Examination-Participation Restrictions Community Activity;Interpersonal Relationship    Stability/Clinical Decision Making Stable/Uncomplicated    Rehab Potential Excellent    PT Frequency 1x / week    PT Duration 12 weeks    PT Treatment/Interventions Cryotherapy;Electrical Stimulation;Moist Heat;Therapeutic exercise;Therapeutic activities;Neuromuscular re-education;Ultrasound;Patient/family education;Manual techniques;Dry needling;Spinal Manipulations    PT Next Visit Plan pallof in 1/2 stand; see if rib cage is moving, see if she needs dry needling, work on pelvic floor contraction    PT Home Exercise Plan  Access Code: [email or web address]    Recommended Other Services sent MD renewal    Consulted and Agree with Plan of Care Patient           Patient will benefit from skilled therapeutic intervention in order to improve the following deficits and impairments:  Decreased activity tolerance, Decreased strength, Increased fascial restricitons, Pain, Increased muscle spasms  Visit Diagnosis: Muscle weakness (generalized) - Plan: PT plan of care cert/re-cert  Unspecified lack of coordination - Plan: PT plan of care cert/re-cert  Coccyx pain - Plan: PT plan of care cert/re-cert     Problem List Patient Active Problem List   Diagnosis Date Noted  . Elevated TSH 08/31/2019  . Hypothyroidism 08/31/2019  . MDD (major depressive disorder), severe (HCC) 08/31/2019  . Gestational hypertension affecting second pregnancy 03/06/2019  . Depression 03/05/2019  . Gestational hypertension 03/05/2019  . History of cesarean delivery affecting pregnancy 03/05/2019  . Desires VBAC (vaginal birth after cesarean) trial 03/05/2019  . Acute blood loss anemia 08/19/2016  . Cesarean delivery delivered 08/18/2016  . Postpartum care following cesarean delivery (11/12) Indication: arrest of dilation 08/18/2016  . Binge eating disorder 12/12/2015  . Anxiety 05/11/2015  . Morbid obesity with BMI of 45.0-49.9, adult [facility]) 05/11/2015    Eulis Foster, PT 05/02/20 2:09 PM ' [place] [facility] [address], [address] [place], [place], [address] Phone: [phone]   Fax:  [phone]  Name: SYDNIE SIGMUND MRN: [id number] Date of Birth: 1986-02-15

## 2020-05-09 ENCOUNTER — Encounter: Payer: Medicaid Other | Admitting: Physical Therapy

## 2020-05-16 ENCOUNTER — Encounter: Payer: Self-pay | Admitting: Physical Therapy

## 2020-05-23 ENCOUNTER — Other Ambulatory Visit: Payer: Self-pay

## 2020-05-23 ENCOUNTER — Encounter: Payer: Self-pay | Admitting: Physical Therapy

## 2020-05-23 ENCOUNTER — Encounter: Payer: Medicaid Other | Attending: Obstetrics & Gynecology | Admitting: Physical Therapy

## 2020-05-23 DIAGNOSIS — R279 Unspecified lack of coordination: Secondary | ICD-10-CM

## 2020-05-23 DIAGNOSIS — M6281 Muscle weakness (generalized): Secondary | ICD-10-CM | POA: Insufficient documentation

## 2020-05-23 DIAGNOSIS — M533 Sacrococcygeal disorders, not elsewhere classified: Secondary | ICD-10-CM | POA: Diagnosis not present

## 2020-05-23 NOTE — Therapy (Signed)
[facility] [address], [address] [place], [place], [address] Phone: [phone]   Fax:  [phone]  Physical Therapy Treatment  Patient Details  Name: Hannah Acosta MRN: [id number] Date of Birth: 03/27/1986 Referring Provider (PT): Dr. Hoover Browns   Encounter Date: 05/23/2020   PT End of Session - 05/23/20 0937    Visit Number 10    Date for PT Re-Evaluation 07/25/20    Authorization Type Medicaid    Authorization Time Period 03/21/2020-06/12/2020    Authorization - Visit Number 6    Authorization - Number of Visits 12    PT Start Time 0837    PT Stop Time 0930    PT Time Calculation (min) 53 min    Activity Tolerance Patient tolerated treatment well;No increased pain    Behavior During Therapy WFL for tasks assessed/performed           Past Medical History:  Diagnosis Date  . Anemia   . Anxiety   . Depression   . Pregnancy induced hypertension     Past Surgical History:  Procedure Laterality Date  . CESAREAN SECTION N/A 08/18/2016   Procedure: CESAREAN SECTION;  Surgeon: Genia Del, MD;  Location: [facility] BIRTHING SUITES;  Service: Obstetrics;  Laterality: N/A;  . CESAREAN SECTION N/A 03/07/2019   Procedure: CESAREAN SECTION;  Surgeon: Osborn Coho, MD;  Location: [facility] LD ORS;  Service: Obstetrics;  Laterality: N/A;    There were no vitals filed for this visit.   Subjective Assessment - 05/23/20 0840    Subjective MD wants me to continue pelvic floor. MD stills wants me to take the Oxybutine; I asked to be referred to a urologist. MD feell I have mixed incontinence. The exercise classes have been doing well. Same number of pads. Patient has had a stomach virus and and upperespiratory infection.    Patient Stated Goals not have to use pantyliners, reduce leakage, feel less self concious about the leakage    Currently in Pain? No/denies                          Pelvic Floor Special Questions -  05/23/20 0001    Pelvic Floor Internal Exam Patient confirms identification and approves PT to assess patient and treatment of the pelvic floor    Exam Type Vaginal    Palpation tightness in the bulbocavernosus bilaterally    Strength fair squeeze, definite lift             [facility] Adult PT Treatment/Exercise - 05/23/20 0001      Self-Care   Self-Care Other Self-Care Comments    Other Self-Care Comments  using the perifit for muscle strangth and relaxation, went over the programs she had on her phone with the perifit and the patient iwll be using the mixed incontinence program      Neuro Re-ed    Neuro Re-ed Details  diaphragmatic breathing to expand the pelvic floor with manual work to assist the muscle to open up and relax then after 5 breaths to contract the pelvic floor muscle and lift upward      Manual Therapy   Manual Therapy Internal Pelvic Floor    Internal Pelvic Floor stroking of bilateal bulbocavernnosus                  PT Education - 05/23/20 0936    Education Details using her perifit program daily; continue with her exercises she is  doing with her workouts    Person(s) Educated Patient    Methods Explanation;Demonstration    Comprehension Verbalized understanding;Returned demonstration            PT Short Term Goals - 03/07/20 1144      PT SHORT TERM GOAL #1   Title independent with initial HEP    Time 4    Period Weeks    Status Achieved    Target Date 03/21/20             PT Long Term Goals - 05/23/20 0942      PT LONG TERM GOAL #1   Title independent with advanced HEP    Baseline learning new exercises as she progresses    Time 12    Period Weeks    Status On-going      PT LONG TERM GOAL #2   Title coccyx pain going from sit to stand and sitting </- 0-1/10    Baseline pain level is 3/10    Time 12    Period Weeks    Status Achieved      PT LONG TERM GOAL #3   Title urinary leakage decreased >/= 80% due to pelvic floor strength  >/= 4/5 with circular contraction and lift    Baseline pelvic floor strength is 3/5; improved by 40%    Time 12    Period Weeks    Status On-going      PT LONG TERM GOAL #4   Title able to contract the abdominals without doming due to improve abdominal contraction and reduction of diastasis recti with improved tenstion generated    Baseline diatasis is 3 finger width above the umbilicus, when contract the abdomen she will dome the midling    Time 12    Period Weeks    Status On-going                 Plan - 05/23/20 6314    Clinical Impression Statement Pelvic floor strength is 3/5 with a better lift. Patient had tightness in the bulbocavernosus muscle. Patient was able to feel the lift of the pelvic floor muscles after the manual work and relaxation breathing. Patient has a drop of the bladder at midway in the vaginal canal when bearing down. Patient has not changes with her bladder leakages. She has been sick for several weeks with a stomach virus and upper respirator infection. Patient back pain has declined. Patient has the perifit pelvic floor muscle relaxer and she will be using it at home to work on pelvic floor strength and relaxation. Patient will benefit from skilled therapy to to improve pelvic floor coordination and reduce urinary leakage.    Personal Factors and Comorbidities Comorbidity 1    Comorbidities C-section 08/18/2016, 03/07/2019    Examination-Activity Limitations Continence;Sit    Examination-Participation Restrictions Community Activity;Interpersonal Relationship    Stability/Clinical Decision Making Stable/Uncomplicated    Rehab Potential Excellent    PT Frequency 1x / week    PT Duration 12 weeks    PT Treatment/Interventions Cryotherapy;Electrical Stimulation;Moist Heat;Therapeutic exercise;Therapeutic activities;Neuromuscular re-education;Ultrasound;Patient/family education;Manual techniques;Dry needling;Spinal Manipulations    PT Next Visit Plan see how  the perifit is doing, manual work to the bulbocavernosus, work on pelvic floor ; check diastasis recti    PT Home Exercise Plan Access Code: [email or web address]    Consulted and Agree with Plan of Care Patient           Patient will benefit from skilled therapeutic intervention in order to  improve the following deficits and impairments:  Decreased activity tolerance, Decreased strength, Increased fascial restricitons, Pain, Increased muscle spasms  Visit Diagnosis: Muscle weakness (generalized)  Unspecified lack of coordination  Coccyx pain     Problem List Patient Active Problem List   Diagnosis Date Noted  . Elevated TSH 08/31/2019  . Hypothyroidism 08/31/2019  . MDD (major depressive disorder), severe (HCC) 08/31/2019  . Gestational hypertension affecting second pregnancy 03/06/2019  . Depression 03/05/2019  . Gestational hypertension 03/05/2019  . History of cesarean delivery affecting pregnancy 03/05/2019  . Desires VBAC (vaginal birth after cesarean) trial 03/05/2019  . Acute blood loss anemia 08/19/2016  . Cesarean delivery delivered 08/18/2016  . Postpartum care following cesarean delivery (11/12) Indication: arrest of dilation 08/18/2016  . Binge eating disorder 12/12/2015  . Anxiety 05/11/2015  . Morbid obesity with BMI of 45.0-49.9, adult [facility]) 05/11/2015    Eulis Foster, PT 05/23/20 9:43 AM   [place] [facility] for [facility] [address] [place], [place], [address] Phone: [phone]   Fax:  [phone]  Name: Hannah Acosta MRN: [id number] Date of Birth: 06/24/1986

## 2020-05-30 ENCOUNTER — Encounter: Payer: Self-pay | Admitting: Physical Therapy

## 2020-06-06 ENCOUNTER — Other Ambulatory Visit: Payer: Self-pay

## 2020-06-06 ENCOUNTER — Encounter: Payer: Self-pay | Admitting: Physical Therapy

## 2020-06-06 ENCOUNTER — Encounter: Payer: Medicaid Other | Admitting: Physical Therapy

## 2020-06-06 DIAGNOSIS — M6281 Muscle weakness (generalized): Secondary | ICD-10-CM

## 2020-06-06 DIAGNOSIS — M533 Sacrococcygeal disorders, not elsewhere classified: Secondary | ICD-10-CM

## 2020-06-06 DIAGNOSIS — R279 Unspecified lack of coordination: Secondary | ICD-10-CM

## 2020-06-06 NOTE — Therapy (Signed)
[facility] at [facility] for [facility] [address], [address] [place], [place], [address] Phone: [phone]   Fax:  [phone]  Physical Therapy Treatment  Patient Details  Name: Hannah Acosta MRN: [id number] Date of Birth: 29-May-1986 Referring Provider (PT): Dr. Hoover Browns   Encounter Date: 06/06/2020   PT End of Session - 06/06/20 0923    Visit Number 11    Date for PT Re-Evaluation 07/25/20    Authorization Type Medicaid    Authorization Time Period 03/21/2020-06/12/2020    Authorization - Visit Number 7    Authorization - Number of Visits 12    PT Start Time 0830    PT Stop Time 0920    PT Time Calculation (min) 50 min    Activity Tolerance Patient tolerated treatment well;No increased pain    Behavior During Therapy WFL for tasks assessed/performed           Past Medical History:  Diagnosis Date  . Anemia   . Anxiety   . Depression   . Pregnancy induced hypertension     Past Surgical History:  Procedure Laterality Date  . CESAREAN SECTION N/A 08/18/2016   Procedure: CESAREAN SECTION;  Surgeon: Genia Del, MD;  Location: [facility] BIRTHING SUITES;  Service: Obstetrics;  Laterality: N/A;  . CESAREAN SECTION N/A 03/07/2019   Procedure: CESAREAN SECTION;  Surgeon: Osborn Coho, MD;  Location: [facility] LD [facility]S;  Service: Obstetrics;  Laterality: N/A;    There were no vitals filed for this visit.   Subjective Assessment - 06/06/20 0834    Subjective My back is really hurting me. I would like to work on my back. I used the perifit and it helped me visually see the muscle contract.    Patient Stated Goals not have to use pantyliners, reduce leakage, feel less self concious about the leakage    Currently in Pain? Yes    Pain Score 6     Pain Location Back    Pain Orientation Mid;Lower    Pain Descriptors / Indicators Dull;Aching;Tightness    Pain Type Acute pain    Pain Onset 1 to 4 weeks ago    Pain Frequency Constant    Aggravating Factors   sleeping,    Pain Relieving Factors exercise class helps    Multiple Pain Sites No              [facility] PT Assessment - 06/06/20 0001      Assessment   Medical Diagnosis R32 Unspeicied urinary incontinence (P)     Referring Provider (PT) Dr. Hoover Browns (P)     Onset Date/Surgical Date 08/18/16 (P)     Prior Therapy none (P)       Precautions   Precautions None (P)     Precaution Comments breast feeding (P)       AROM   Overall AROM Comments flexion with tightness in the TL junction (P)     Lumbar - Right Rotation decreased by 25% (P)                          [facility] Adult PT Treatment/Exercise - 06/06/20 0001      Therapeutic Activites    Therapeutic Activities Other Therapeutic Activities    Other Therapeutic Activities education on posture to contract the lower abdominals for some pos. tilt, lift chest, and tuck chin in to reduce ther tightness in the TL junction      Lumbar Exercises: Stretches  Double Knee to Chest Stretch 3 reps;20 seconds      Lumbar Exercises: Supine   Other Supine Lumbar Exercises supine with massage balls alont the lower and mid thoracic spine and moving shoulders up and down      Lumbar Exercises: Quadruped   Madcat/Old Horse 15 reps    Madcat/Old Horse Limitations tactile cues to go through the full motion    Other Quadruped Lumbar Exercises quadruped with breathing and tigthening the lower abdominals      Manual Therapy   Manual Therapy Soft tissue mobilization;Joint mobilization    Joint Mobilization PA and rotational mobilization to T3-L5 grade III in prone and sidely    Soft tissue mobilization thoracic and lumbar paraspinals using the addaday            Trigger Point Dry Needling - 06/06/20 0001    Consent Given? Yes    Education Handout Provided Previously provided    Muscles Treated Back/Hip Thoracic multifidi;Lumbar multifidi;Quadratus lumborum    Lumbar multifidi Response Twitch response elicited;Palpable  increased muscle length    Thoracic multifidi response Twitch response elicited;Palpable increased muscle length                PT Education - 06/06/20 0923    Education Details Access Code: [email or web address]; postureal awareness; using her perifit    Person(s) Educated Patient    Methods Explanation;Demonstration;Verbal cues;Handout    Comprehension Returned demonstration;Verbalized understanding            PT Short Term Goals - 03/07/20 1144      PT SHORT TERM GOAL #1   Title independent with initial HEP    Time 4    Period Weeks    Status Achieved    Target Date 03/21/20             PT Long Term Goals - 06/06/20 0928      PT LONG TERM GOAL #1   Title independent with advanced HEP    Baseline learning new exercises as she progresses    Time 12    Period Weeks    Status On-going      PT LONG TERM GOAL #2   Title coccyx pain going from sit to stand and sitting </- 0-1/10    Baseline no complaints of coccyx pain    Time 12    Period Weeks    Status Achieved      PT LONG TERM GOAL #3   Title urinary leakage decreased >/= 80% due to pelvic floor strength >/= 4/5 with circular contraction and lift    Baseline pelvic floor strength is 3/5; improved by 40%    Time 12    Period Weeks    Status On-going      PT LONG TERM GOAL #4   Title able to contract the abdominals without doming due to improve abdominal contraction and reduction of diastasis recti with improved tenstion generated    Baseline diatasis is 3 finger width above the umbilicus, when contract the abdomen she will dome the midling    Time 12    Period Weeks    Status On-going                 Plan - 06/06/20 0924    Clinical Impression Statement Patient had increased in thoracic lumbar pain due ot being stuck in extension and her posture. Patient pain level decreased to a 2/10 from a 6/10. Patient was made aware of her posture and when she  tightens her lower abdominals she feel the pelvic floor  muscles contract. Patient is able to do quadruped breathing with abdominal contract and pelvi cfloor contraction keeping a neutral spine. Patient has used her Perifit and feels a difference in her leakage. Patient is coming along slowly but showing progress. Patient will benefit from skilled therapy to improve pelvic floor coordination and reduce urinary leakage.    Personal Factors and Comorbidities Comorbidity 1    Comorbidities C-section 08/18/2016, 03/07/2019    Examination-Activity Limitations Continence;Sit    Examination-Participation Restrictions Community Activity;Interpersonal Relationship    Stability/Clinical Decision Making Stable/Uncomplicated    Rehab Potential Excellent    PT Frequency 1x / week    PT Duration 12 weeks    PT Treatment/Interventions Cryotherapy;Electrical Stimulation;Moist Heat;Therapeutic exercise;Therapeutic activities;Neuromuscular re-education;Ultrasound;Patient/family education;Manual techniques;Dry needling;Spinal Manipulations    PT Next Visit Plan see how the perifit is doing, manual work to the bulbocavernosus, work on pelvic floor ; check diastasis recti; work on elongation of the thoracolumbar junction    PT Home Exercise Plan Access Code: [email or web address]    Consulted and Agree with Plan of Care Patient           Patient will benefit from skilled therapeutic intervention in order to improve the following deficits and impairments:  Decreased activity tolerance, Decreased strength, Increased fascial restricitons, Pain, Increased muscle spasms  Visit Diagnosis: Muscle weakness (generalized)  Unspecified lack of coordination  Coccyx pain     Problem List Patient Active Problem List   Diagnosis Date Noted  . Elevated TSH 08/31/2019  . Hypothyroidism 08/31/2019  . MDD (major depressive disorder), severe (HCC) 08/31/2019  . Gestational hypertension affecting second pregnancy 03/06/2019  . Depression 03/05/2019  . Gestational hypertension 03/05/2019   . History of cesarean delivery affecting pregnancy 03/05/2019  . Desires VBAC (vaginal birth after cesarean) trial 03/05/2019  . Acute blood loss anemia 08/19/2016  . Cesarean delivery delivered 08/18/2016  . Postpartum care following cesarean delivery (11/12) Indication: arrest of dilation 08/18/2016  . Binge eating disorder 12/12/2015  . Anxiety 05/11/2015  . Morbid obesity with BMI of 45.0-49.9, adult [facility]) 05/11/2015    Eulis Foster, PT 06/06/20 9:30 AM   [facility] at [facility] for [facility] [address], [address] [place], [place], [address] Phone: [phone]   Fax:  [phone]  Name: Hannah Acosta MRN: [id number] Date of Birth: 09-11-1986

## 2020-06-06 NOTE — Patient Instructions (Signed)
Access Code: [email or web address] URL: [email or web address] Date: 06/06/2020 Prepared by: Eulis Foster  Exercises Quadruped Diaphragmatic Breathing - 1 x daily - 7 x weekly - 1 sets - 10 reps Cat-Camel to Child's Pose - 1 x daily - 7 x weekly - 1 sets - 10 reps - 30 sec hold Quadruped Thoracic Rotation with Hand on Neck - 1 x daily - 7 x weekly - 1 sets - 10 reps Eulis Foster, PT [facility] [address], [address] [place], [place] [address] W: [phone] [email or web address]

## 2020-06-13 ENCOUNTER — Encounter: Payer: Self-pay | Admitting: Physical Therapy

## 2020-06-13 ENCOUNTER — Other Ambulatory Visit: Payer: Self-pay

## 2020-06-13 ENCOUNTER — Encounter: Payer: Medicaid Other | Attending: Obstetrics & Gynecology | Admitting: Physical Therapy

## 2020-06-13 DIAGNOSIS — M533 Sacrococcygeal disorders, not elsewhere classified: Secondary | ICD-10-CM | POA: Diagnosis not present

## 2020-06-13 DIAGNOSIS — M6281 Muscle weakness (generalized): Secondary | ICD-10-CM | POA: Diagnosis not present

## 2020-06-13 DIAGNOSIS — R279 Unspecified lack of coordination: Secondary | ICD-10-CM

## 2020-06-13 NOTE — Therapy (Signed)
[facility] at [facility] [address], [address] [place], [place], [address] Phone: [phone]   Fax:  [phone]  Physical Therapy Treatment  Patient Details  Name: Hannah Acosta MRN: [id number] Date of Birth: 09-19-1986 Referring Provider (PT): Dr. Hoover Browns   Encounter Date: 06/13/2020   PT End of Session - 06/13/20 0928    Visit Number 12    Date for PT Re-Evaluation 07/25/20    Authorization Type Medicaid    Authorization Time Period since 04/06/2020 had 5 visits    Authorization - Visit Number 5    Authorization - Number of Visits 27    PT Start Time 3500    PT Stop Time 0920    PT Time Calculation (min) 48 min    Activity Tolerance Patient tolerated treatment well;No increased pain    Behavior During Therapy WFL for tasks assessed/performed           Past Medical History:  Diagnosis Date  . Anemia   . Anxiety   . Depression   . Pregnancy induced hypertension     Past Surgical History:  Procedure Laterality Date  . CESAREAN SECTION N/A 08/18/2016   Procedure: CESAREAN SECTION;  Surgeon: Genia Del, MD;  Location: [facility] BIRTHING SUITES;  Service: Obstetrics;  Laterality: N/A;  . CESAREAN SECTION N/A 03/07/2019   Procedure: CESAREAN SECTION;  Surgeon: Osborn Coho, MD;  [facility]: [facility] LD [facility]S;  Service: Obstetrics;  Laterality: N/A;    There were no vitals filed for this visit.   Subjective Assessment - 06/13/20 0841    Subjective The back is feeling better. I have been doing the Perifit. I am on level 2 with mixed incontinence. I am tired after 10 minutes. I am not getting up to urinate as much. I am still using pads and can use 1-2 pads. Instead of 3-4.Marland Kitchen Pads less damp.    Patient Stated Goals not have to use pantyliners, reduce leakage, feel less self concious about the leakage    Currently in Pain? Yes    Pain Score 1     Pain Location Back    Pain Orientation Mid    Pain Descriptors / Indicators  Aching;Dull;Tightness    Pain Onset 1 to 4 weeks ago    Pain Frequency Intermittent    Aggravating Factors  sleeping    Pain Relieving Factors exercise class helps    Multiple Pain Sites No                          Pelvic Floor Special Questions - 06/13/20 0001    Diastasis Recti 1-2 fingers with good tnesion and no doming    Pad use 1-2 panty liners    Urinary frequency 1 time at 5: 00 AM    Pelvic Floor Internal Exam Patient confirms identification and approves PT to assess patient and treatment of the pelvic floor    Exam Type Vaginal    Palpation tightness on thelevt bulbocavernosus and urethra sphincter    Strength fair squeeze, definite lift             [facility] Adult PT Treatment/Exercise - 06/13/20 0001      Neuro Re-ed    Neuro Re-ed Details  pelvic floor contraction with tactile cues to lift and not lift rib cage 10x for 5 seconds      Lumbar Exercises: Supine   Ab Set 10 reps;5 seconds    AB Set Limitations while  on the foam roll and tactile  cues to engage the upper abdominals    Other Supine Lumbar Exercises supine with foam roll along the mid thoracic and rolling on it to mobilize T4-T8 then hip thrust with VC to tightne the abodminals and squeeze the gluteals 10x      Shoulder Exercises: Supine   Other Supine Exercises supine bil. shoulder flexion keeping the TL junction flat and not extending at the area as the arms go overhead                  PT Education - 06/13/20 0924    Education Details overhead breathing with keeping the Thoracic lumbar junction pressed inot floor    Person(s) Educated Patient    Methods Explanation;Demonstration;Verbal cues;Handout    Comprehension Returned demonstration;Verbalized understanding            PT Short Term Goals - 03/07/20 1144      PT SHORT TERM GOAL #1   Title independent with initial HEP    Time 4    Period Weeks    Status Achieved    Target Date 03/21/20             PT Long  Term Goals - 06/13/20 0936      PT LONG TERM GOAL #1   Title independent with advanced HEP    Baseline learning new exercises as she progresses    Time 12    Period Weeks    Status On-going      PT LONG TERM GOAL #3   Title urinary leakage decreased >/= 80% due to pelvic floor strength >/= 4/5 with circular contraction and lift    Baseline pelvic floor strength is 3/5; down to 1-2 pads    Time 12    Period Weeks    Status On-going      PT LONG TERM GOAL #4   Title able to contract the abdominals without doming due to improve abdominal contraction and reduction of diastasis recti with improved tenstion generated    Baseline 2 fingers width with very good tnesion    Time 12    Period Weeks    Status Achieved                 Plan - 06/13/20 3220    Clinical Impression Statement Patient has a diastasis 2 fingers but the therapist is not able to push her fingers down into it due to the good tension she has. Patient is down wot 1-2 pads compared to 2-3. Patient reports her pads are dryer. She is getting up 1 times at night compared to 2 times. Patient is using her Perifit daily for 10 minutes. Pelvic floor strength is 3/5 with tightness on the left bulbocavernosus and urethra sphincter. Patient is learning how to breath and bring her arms overhead without hinging at the thoracic lumbar junction. She continues to not complain about the coccyx pain. Patient will benefit from skilled therapy to improve pelvic floor coordination and reduce urinary leakage.    Personal Factors and Comorbidities Comorbidity 1    Comorbidities C-section 08/18/2016, 03/07/2019    Examination-Activity Limitations Continence;Sit    Examination-Participation Restrictions Community Activity;Interpersonal Relationship    Stability/Clinical Decision Making Stable/Uncomplicated    Rehab Potential Excellent    PT Frequency 1x / week    PT Duration 12 weeks    PT Treatment/Interventions Cryotherapy;Electrical  Stimulation;Moist Heat;Therapeutic exercise;Therapeutic activities;Neuromuscular re-education;Ultrasound;Patient/family education;Manual techniques;Dry needling;Spinal Manipulations    PT Next Visit Plan  work on pelvic floor  strength in standing; work the lower traps; deep squat to stretch the lumbar    PT Home Exercise Plan Access Code: [email or web address]    Consulted and Agree with Plan of Care Patient           Patient will benefit from skilled therapeutic intervention in order to improve the following deficits and impairments:  Decreased activity tolerance, Decreased strength, Increased fascial restricitons, Pain, Increased muscle spasms  Visit Diagnosis: Muscle weakness (generalized)  Unspecified lack of coordination  Coccyx pain     Problem List Patient Active Problem List   Diagnosis Date Noted  . Elevated TSH 08/31/2019  . Hypothyroidism 08/31/2019  . MDD (major depressive disorder), severe (HCC) 08/31/2019  . Gestational hypertension affecting second pregnancy 03/06/2019  . Depression 03/05/2019  . Gestational hypertension 03/05/2019  . History of cesarean delivery affecting pregnancy 03/05/2019  . Desires VBAC (vaginal birth after cesarean) trial 03/05/2019  . Acute blood loss anemia 08/19/2016  . Cesarean delivery delivered 08/18/2016  . Postpartum care following cesarean delivery (11/12) Indication: arrest of dilation 08/18/2016  . Binge eating disorder 12/12/2015  . Anxiety 05/11/2015  . Morbid obesity with BMI of 45.0-49.9, adult [facility]) 05/11/2015    Eulis Foster, PT 06/13/20 9:37 AM   [place] [facility] for [facility] [address], [address] [place], [place], [address] Phone: [phone]   Fax:  [phone]  Name: DORTHEY DEPACE MRN: [id number] Date of Birth: 1986-01-12

## 2020-07-11 ENCOUNTER — Encounter: Payer: Medicaid Other | Attending: Obstetrics & Gynecology | Admitting: Physical Therapy

## 2020-07-11 ENCOUNTER — Other Ambulatory Visit: Payer: Self-pay

## 2020-07-11 ENCOUNTER — Encounter: Payer: Self-pay | Admitting: Physical Therapy

## 2020-07-11 DIAGNOSIS — R279 Unspecified lack of coordination: Secondary | ICD-10-CM | POA: Diagnosis present

## 2020-07-11 DIAGNOSIS — M6281 Muscle weakness (generalized): Secondary | ICD-10-CM | POA: Insufficient documentation

## 2020-07-11 DIAGNOSIS — M533 Sacrococcygeal disorders, not elsewhere classified: Secondary | ICD-10-CM | POA: Diagnosis present

## 2020-07-11 NOTE — Therapy (Signed)
[facility] at [facility] [address] [place], [place], [address] Phone: [phone]   Fax:  [phone]  Physical Therapy Treatment  Patient Details  Name: Hannah Acosta MRN: [id number] Date of Birth: April 14, 1986 Referring Provider (PT): Dr. Hoover Browns   Encounter Date: 07/11/2020   PT End of Session - 07/11/20 1505    Visit Number 13    Date for PT Re-Evaluation 07/25/20    Authorization Type Medicaid    Authorization Time Period since 04/06/2020 had 5 visits    Authorization - Visit Number 6    Authorization - Number of Visits 27    PT Start Time 1503    PT Stop Time 1548    PT Time Calculation (min) 45 min    Activity Tolerance Patient tolerated treatment well;No increased pain    Behavior During Therapy WFL for tasks assessed/performed           Past Medical History:  Diagnosis Date  . Anemia   . Anxiety   . Depression   . Pregnancy induced hypertension     Past Surgical History:  Procedure Laterality Date  . CESAREAN SECTION N/A 08/18/2016   Procedure: CESAREAN SECTION;  Surgeon: Genia Del, MD;  Location: [facility] BIRTHING SUITES;  Service: Obstetrics;  Laterality: N/A;  . CESAREAN SECTION N/A 03/07/2019   Procedure: CESAREAN SECTION;  Surgeon: Osborn Coho, MD;  Location: [facility] LD ORS;  Service: Obstetrics;  Laterality: N/A;    There were no vitals filed for this visit.   Subjective Assessment - 07/11/20 1507    Subjective I saw a urologist last month. He did not feel like I had a prolapse bladder. I did a bladder filling test. MD said I had good control and feels the pelvic floor is helping. I got a sample of Mybetriq. I took the Mybetriq with no side effects and still leak urine. MD felt my urethra is mobile.    Patient Stated Goals not have to use pantyliners, reduce leakage, feel less self concious about the leakage    Currently in Pain? Yes    Pain Score 6     Pain Location Back    Pain Orientation Mid      Pain Descriptors / Indicators Aching;Dull;Tightness    Pain Type Acute pain    Pain Onset 1 to 4 weeks ago    Pain Frequency Intermittent    Aggravating Factors  sleeping    Pain Relieving Factors exercise class helps    Multiple Pain Sites No              [facility] PT Assessment - 07/11/20 0001      Assessment   Medical Diagnosis R32 Unspeicied urinary incontinence    Referring Provider (PT) Dr. 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, carrying big bags of dog food    Leisure goes to [employer] and me baby class, walks in the park      Cognition   Overall Cognitive Status Within Functional Limits for tasks assessed  AROM   Overall AROM Comments flexion with tightness in the TL junction    Lumbar - Right Rotation full      Strength   Right Hip ABduction 5/5    Left Hip ABduction 5/5                      Pelvic Floor Special Questions - 07/11/20 0001    Pelvic Floor Internal Exam Patient confirms identification and approves PT to assess patient and treatment of the pelvic floor    Exam Type Vaginal    Palpation tightness in the clitoral hood, alnong the isciocavernosus and bulbocavernosus, at first the clitoral hood would not go downward but after manual work it did    Strength good squeeze, good lift, able to hold agaisnt strong resistance   at end of treatment but took many tactile cues            [facility] Adult PT Treatment/Exercise - 07/11/20 0001      Neuro Re-ed    Neuro Re-ed Details  pelvic floro contraction wiht therapist finger in the vaginal cananl to feel the contraction and guide  patient with anterior contraction and lower abdominal contraction, pelvic floro contraction with lifting head wiht VC to not bulge the stomach out      Manual Therapy   Manual Therapy Soft tissue mobilization;Internal Pelvic Floor    Soft tissue mobilization bil. ischiocavernosus and citoral hood    Internal Pelvic Floor bulbocavernosus and uretra sphincter                    PT Short Term Goals - 03/07/20 1144      PT SHORT TERM GOAL #1   Title independent with initial HEP    Time 4    Period Weeks    Status Achieved    Target Date 03/21/20             PT Long Term Goals - 07/11/20 1518      PT LONG TERM GOAL #1   Title independent with advanced HEP    Baseline learning new exercises as she progresses    Time 12    Period Weeks    Status On-going      PT LONG TERM GOAL #2   Title coccyx pain going from sit to stand and sitting </- 0-1/10    Baseline no complaints of coccyx pain    Time 12    Period Weeks    Status Achieved      PT LONG TERM GOAL #3   Title urinary leakage decreased >/= 80% due to pelvic floor strength >/= 4/5 with circular contraction and lift    Baseline pelvic floor strength is 3/5; 40% better, down to 2 pads    Time 12    Period Weeks    Status On-going      PT LONG TERM GOAL #4   Title able to contract the abdominals without doming due to improve abdominal contraction and reduction of diastasis recti with improved tenstion generated    Baseline 2 fingers width with very good tnesion    Time 12    Period Weeks    Status Achieved                 Plan - 07/11/20 1506    Clinical Impression Statement Patient reports her urinary leakage is 40% better. She is down to 2 pads. Patient will leak the most during her exercise and  sometimes just leaks without activity. Patient clitoral hood would not go downward but after manual work she was able to move it downward. Plevic floro strength increased from a 3/5 to 4/5 after manual work  and tactile cues to contract the anterior pelvic floor. When patient curls up she will bulge her abdoment unless she has verbal cues then is able to brace her abdominals. Patient contiues to have back pain. When she is contracting her pelvic floor she will overcontract her lumbar paraspinals. Used a post. pelvic tilt to reduce the paraspinals overactivity. Patient continues to not have coccyx pain. Patient reports she is trying a new medicine to see if helps with urinary leakage. Patient will benefit from skilled therapy to improve pelvic floor contraction and reduce urinary leakage.    Personal Factors and Comorbidities Comorbidity 1    Comorbidities C-section 08/18/2016, 03/07/2019    Examination-Activity Limitations Continence;Sit    Examination-Participation Restrictions Community Activity;Interpersonal Relationship    Stability/Clinical Decision Making Stable/Uncomplicated    Rehab Potential Excellent    PT Frequency 1x / week    PT Duration Other (comment)   10 weeks after 10/19   PT Treatment/Interventions Cryotherapy;Electrical Stimulation;Moist Heat;Therapeutic exercise;Therapeutic activities;Neuromuscular re-education;Ultrasound;Patient/family education;Manual techniques;Dry needling;Spinal Manipulations    PT Next Visit Plan work on pelvic floor contraction with reduction of lumbar over activity. deep squat stretch, pelvic floor contraction with tactile cues with leg movement    PT Home Exercise Plan Access Code: [email or web address]    Consulted and Agree with Plan of Care Patient           Patient will benefit from skilled therapeutic intervention in order to improve the following deficits and impairments:  Decreased activity tolerance, Decreased strength, Increased fascial restricitons, Pain, Increased muscle spasms  Visit Diagnosis: Muscle weakness (generalized) - Plan: PT plan of care cert/re-cert  Unspecified lack of coordination - Plan: PT plan of care cert/re-cert  Coccyx pain - Plan:  PT plan of care cert/re-cert     Problem List Patient Active Problem List   Diagnosis Date Noted  . Elevated TSH 08/31/2019  . Hypothyroidism 08/31/2019  . MDD (major depressive disorder), severe (HCC) 08/31/2019  . Gestational hypertension affecting second pregnancy 03/06/2019  . Depression 03/05/2019  . Gestational hypertension 03/05/2019  . History of cesarean delivery affecting pregnancy 03/05/2019  . Desires VBAC (vaginal birth after cesarean) trial 03/05/2019  . Acute blood loss anemia 08/19/2016  . Cesarean delivery delivered 08/18/2016  . Postpartum care following cesarean delivery (11/12) Indication: arrest of dilation 08/18/2016  . Binge eating disorder 12/12/2015  . Anxiety 05/11/2015  . Morbid obesity with BMI of 45.0-49.9, adult [facility]) 05/11/2015    Eulis Foster, PT 07/11/20 4:01 PM   [facility] at [facility] [address], [address] [place], [place], [address] Phone: [phone]   Fax:  [phone]  Name: DARCEL ZICK MRN: [id number] Date of Birth: 24-Nov-1985

## 2020-07-18 ENCOUNTER — Encounter: Payer: Medicaid Other | Admitting: Physical Therapy

## 2020-07-25 ENCOUNTER — Encounter: Payer: Medicaid Other | Admitting: Physical Therapy

## 2020-07-25 ENCOUNTER — Other Ambulatory Visit: Payer: Self-pay

## 2020-07-25 ENCOUNTER — Encounter: Payer: Self-pay | Admitting: Physical Therapy

## 2020-07-25 DIAGNOSIS — M6281 Muscle weakness (generalized): Secondary | ICD-10-CM

## 2020-07-25 DIAGNOSIS — M533 Sacrococcygeal disorders, not elsewhere classified: Secondary | ICD-10-CM

## 2020-07-25 DIAGNOSIS — R279 Unspecified lack of coordination: Secondary | ICD-10-CM

## 2020-07-25 NOTE — Therapy (Signed)
[facility] at [facility] [address] [place], [place], [address] Phone: [phone]   Fax:  [phone]  Physical Therapy Treatment  Patient Details  Name: Hannah Acosta MRN: [id number] Date of Birth: 02/18/1986 Referring Provider (PT): Dr. Hoover Browns   Encounter Date: 07/25/2020   PT End of Session - 07/25/20 1525    Visit Number 14    Date for PT Re-Evaluation 09/19/20    Authorization Type Medicaid    Authorization Time Period since 04/06/2020 had 5 visits    Authorization - Visit Number 7    Authorization - Number of Visits 27    PT Start Time 1510    PT Stop Time 1555    PT Time Calculation (min) 45 min    Activity Tolerance Patient tolerated treatment well;No increased pain    Behavior During Therapy WFL for tasks assessed/performed           Past Medical History:  Diagnosis Date  . Anemia   . Anxiety   . Depression   . Pregnancy induced hypertension     Past Surgical History:  Procedure Laterality Date  . CESAREAN SECTION N/A 08/18/2016   Procedure: CESAREAN SECTION;  Surgeon: Genia Del, MD;  Location: [facility] BIRTHING SUITES;  Service: Obstetrics;  Laterality: N/A;  . CESAREAN SECTION N/A 03/07/2019   Procedure: CESAREAN SECTION;  Surgeon: Osborn Coho, MD;  Location: [facility] LD ORS;  Service: Obstetrics;  Laterality: N/A;    There were no vitals filed for this visit.   Subjective Assessment - 07/25/20 1514    Subjective I mad it to 3 different workout classes. I am drinking more water. I have 1 week of the Mybetriq and not seeing a difference. I am leaking urine the same amount. I  did the perifit one time this week.    Patient Stated Goals not have to use pantyliners, reduce leakage, feel less self concious about the leakage    Currently in Pain? Yes    Pain Score 7     Pain Location Back    Pain Orientation Mid    Pain Descriptors / Indicators Aching;Tightness;Dull    Pain Type Acute pain    Pain  Onset 1 to 4 weeks ago    Pain Frequency Intermittent    Aggravating Factors  after waking up    Pain Relieving Factors exercise class helps              [facility] PT Assessment - 07/25/20 0001      Assessment   Medical Diagnosis R32 Unspeicied urinary incontinence    Referring Provider (PT) Dr. 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, carrying big bags of dog food    Leisure goes to [employer] and me baby class, walks in the park      Cognition   Overall Cognitive Status Within Functional Limits for tasks assessed      AROM   Overall AROM Comments flexion with tightness in the TL junction  Pelvic Floor Special Questions - 07/25/20 0001    Diastasis Recti 2 fingers above umbilicus with good tension and with some  doming;l 1/2 finger width with good tension below umbilicus    Pad use 1-2 panty liners    Urinary frequency 1 time at 5: 00 AM             [facility] Adult PT Treatment/Exercise - 07/25/20 0001      Lumbar Exercises: Supine   Ab Set 15 reps    AB Set Limitations tactile cues to engage the abdominals    Other Supine Lumbar Exercises supine blowing into a rolled up piece of theraband to give reisistance and engage the abdominals, lots of tactile with verbal cues to contract the abdomen and not bulge, sinking the abdomen downward, not to hold her breath then did the same in sitting with a lift of the lower abdomen                  PT Education - 07/25/20 1556    Education Details Access Code: [email or web address]    Person(s) Educated Patient    Methods  Explanation;Demonstration;Verbal cues;Handout    Comprehension Returned demonstration;Verbalized understanding            PT Short Term Goals - 03/07/20 1144      PT SHORT TERM GOAL #1   Title independent with initial HEP    Time 4    Period Weeks    Status Achieved    Target Date 03/21/20             PT Long Term Goals - 07/25/20 1557      PT LONG TERM GOAL #1   Title independent with advanced HEP    Baseline learning new exercises as she progresses    Time 12    Period Weeks    Status On-going      PT LONG TERM GOAL #2   Title coccyx pain going from sit to stand and sitting </- 0-1/10    Baseline no complaints of coccyx pain    Time 12    Status Achieved      PT LONG TERM GOAL #3   Title urinary leakage decreased >/= 80% due to pelvic floor strength >/= 4/5 with circular contraction and lift    Baseline pelvic floor strength is 3/5; 40% better, down to 2 pads    Time 12    Period Weeks    Status On-going      PT LONG TERM GOAL #4   Title able to contract the abdominals without doming due to improve abdominal contraction and reduction of diastasis recti with improved tenstion generated    Baseline 2 fingers width with very good tension; needs tactile and verbal cues to not doom the abdomen above the umbilicus    Time 12    Period Weeks    Status On-going                 Plan - 07/25/20 1558    Clinical Impression Statement Patient reports her urinary leakage is 40% better. She is down to 2 pads. If she does not wear a pad her underwear and pants are wet. Patient is able to feel the pelvic floor contract but she still has difficulty with coordination of her abdominals. She will place pressure on the pelvic floor as she dooms above the umbilicus. Patient has 2 fingers width with good tension above the umbilicus. Patient continues to have back pain.  Patient does not have coccyx pain anymore. Patient will benefit from skilled therapy to improve pelvic floor  contraction and abodminal coordination to reduce her urinary leakage.    Personal Factors and Comorbidities Comorbidity 1    Examination-Activity Limitations Continence;Sit    Examination-Participation Restrictions Community Activity;Interpersonal Relationship    Stability/Clinical Decision Making Stable/Uncomplicated    Rehab Potential Excellent    PT Frequency 1x / week    PT Duration 8 weeks    PT Treatment/Interventions Cryotherapy;Electrical Stimulation;Moist Heat;Therapeutic exercise;Therapeutic activities;Neuromuscular re-education;Ultrasound;Patient/family education;Manual techniques;Dry needling;Spinal Manipulations    PT Next Visit Plan work on pelvic floor contraction with reduction of lumbar over activity and engaging the abdoemn deep squat stretch, pelvic floor contraction with tactile cues with leg movement    PT Home Exercise Plan Access Code: [email or web address]    Recommended Other Services sent MD renewal    Consulted and Agree with Plan of Care Patient           Patient will benefit from skilled therapeutic intervention in order to improve the following deficits and impairments:  Decreased activity tolerance, Decreased strength, Increased fascial restricitons, Pain, Increased muscle spasms  Visit Diagnosis: Muscle weakness (generalized) - Plan: PT plan of care cert/re-cert  Unspecified lack of coordination - Plan: PT plan of care cert/re-cert  Coccyx pain - Plan: PT plan of care cert/re-cert     Problem List Patient Active Problem List   Diagnosis Date Noted  . Elevated TSH 08/31/2019  . Hypothyroidism 08/31/2019  . MDD (major depressive disorder), severe (HCC) 08/31/2019  . Gestational hypertension affecting second pregnancy 03/06/2019  . Depression 03/05/2019  . Gestational hypertension 03/05/2019  . History of cesarean delivery affecting pregnancy 03/05/2019  . Desires VBAC (vaginal birth after cesarean) trial 03/05/2019  . Acute blood loss anemia 08/19/2016  .  Cesarean delivery delivered 08/18/2016  . Postpartum care following cesarean delivery (11/12) Indication: arrest of dilation 08/18/2016  . Binge eating disorder 12/12/2015  . Anxiety 05/11/2015  . Morbid obesity with BMI of 45.0-49.9, adult [facility]) 05/11/2015    Eulis Foster, PT 07/25/20 5:18 PM   [place] [facility] at [facility] [address], [address] [place], [place], [address] Phone: [phone]   Fax:  [phone]  Name: Hannah Acosta MRN: [id number] Date of Birth: 05/27/1986

## 2020-07-25 NOTE — Patient Instructions (Signed)
Access Code: [email or web address] URL: [email or web address] Date: 07/25/2020 Prepared by: Eulis Foster  Exercises Supine Transversus Abdominis Bracing - Hands on Stomach - 1 x daily - 7 x weekly - 1 sets - 10 reps  Patient Education Trigger Point Dry Needling Ionto Patient Instructions Eulis Foster, PT [facility] [address], [address] [place], [place] [address] W: [phone] [name]

## 2020-08-01 ENCOUNTER — Encounter: Payer: Medicaid Other | Admitting: Physical Therapy

## 2020-08-08 ENCOUNTER — Other Ambulatory Visit: Payer: Self-pay

## 2020-08-08 ENCOUNTER — Encounter: Payer: Self-pay | Admitting: Physical Therapy

## 2020-08-08 ENCOUNTER — Encounter: Payer: Medicaid Other | Attending: Obstetrics & Gynecology | Admitting: Physical Therapy

## 2020-08-08 DIAGNOSIS — M533 Sacrococcygeal disorders, not elsewhere classified: Secondary | ICD-10-CM | POA: Insufficient documentation

## 2020-08-08 DIAGNOSIS — M6281 Muscle weakness (generalized): Secondary | ICD-10-CM | POA: Diagnosis not present

## 2020-08-08 DIAGNOSIS — R279 Unspecified lack of coordination: Secondary | ICD-10-CM

## 2020-08-08 NOTE — Therapy (Addendum)
[place] at [facility] [address] [place], [place], [address] Phone: [phone]   Fax:  [phone]  Physical Therapy Treatment  Patient Details  Name: Hannah Acosta MRN: [id number] Date of Birth: Mar 26, 1986 Referring Provider (PT): Dr. Waymon Amato   Encounter Date: 08/08/2020   PT End of Session - 08/08/20 1008    Visit Number 15    Date for PT Re-Evaluation 09/19/20    Authorization Type Medicaid    Authorization Time Period since 04/06/2020 had 5 visits    Authorization - Visit Number 8    Authorization - Number of Visits 27    PT Start Time 0900    PT Stop Time 1000    PT Time Calculation (min) 60 min    Activity Tolerance Patient tolerated treatment well;No increased pain    Behavior During Therapy WFL for tasks assessed/performed           Past Medical History:  Diagnosis Date  . Anemia   . Anxiety   . Depression   . Pregnancy induced hypertension     Past Surgical History:  Procedure Laterality Date  . CESAREAN SECTION N/A 08/18/2016   Procedure: CESAREAN SECTION;  Surgeon: Princess Bruins, MD;  Location: [place];  Service: Obstetrics;  Laterality: N/A;  . CESAREAN SECTION N/A 03/07/2019   Procedure: CESAREAN SECTION;  Surgeon: Everett Graff, MD;  Location: [place] LD ORS;  Service: Obstetrics;  Laterality: N/A;    There were no vitals filed for this visit.   Subjective Assessment - 08/08/20 0905    Subjective The leakage is the same.    Patient Stated Goals not have to use pantyliners, reduce leakage, feel less self concious about the leakage    Currently in Pain? No/denies              [facility] PT Assessment - 08/08/20 0001      Assessment   Medical Diagnosis R32 Unspeicied urinary incontinence    Referring Provider (PT) Dr. Waymon Amato    Onset Date/Surgical Date 08/18/16    Prior Therapy none      Precautions   Precautions None    Precaution Comments breast feeding       Restrictions   Weight Bearing Restrictions No      [place] residence      Prior Function   Level of Independence Independent    Vocation Part time employment    Education officer, museum reception- walking, carrying big bags of dog food    Leisure goes to [employer] and [employer] baby class, walks in the park      Cognition   Overall Cognitive Status Within Functional Limits for tasks assessed      AROM   Overall AROM Comments flexion with tightness in the TL junction    Lumbar - Right Rotation full      Strength   Right Hip ABduction 5/5    Left Hip ABduction 5/5                      Pelvic Floor Special Questions - 08/08/20 0001    Diastasis Recti 2 fingers above umbilicus with good tension and with some  doming;l 1/2 finger width with good tension below umbilicus    Pad use 1-2 panty liners    Urinary frequency 1 time at 5: 00 AM    Strength good squeeze, good lift, able to hold agaisnt strong resistance  at end of treatment but took many tactile cues            [facility] PT Treatment/Exercise - 08/08/20 0001      Self-Care   Self-Care Other Self-Care Comments    Other Self-Care Comments  discussed with patient pessaries, had her try the impresa level 1 to see if it helps stabilize ther urethra      Lumbar Exercises: Supine   Ab Set 10 reps    Dead Bug 20 reps;1 second    Dead Bug Limitations breath out with movement      Lumbar Exercises: Quadruped   Straight Leg Raise 10 reps    Straight Leg Raises Limitations on left with control and not letting hips drop    Opposite Arm/Leg Raise Left arm/Right leg;10 reps;1 second    Opposite Arm/Leg Raise Limitations working on control; tried other arm and leg but not able to control and leaned too much to the right      Shoulder Exercises: Supine   Horizontal ABduction Strengthening;Both;10 reps;Theraband    Other Supine Exercises 1/2 kneel chop and lift with red theraband  and engaging the core                  PT Education - 08/08/20 1007    Education Details Access Code: [email or web address]; gave patient information about impressa poise to see if assists with urinary leakage    Person(s) Educated Patient    Methods Explanation;Demonstration;Verbal cues;Handout    Comprehension Returned demonstration;Verbalized understanding            PT Short Term Goals - 03/07/20 1144      PT SHORT TERM GOAL #1   Title independent with initial HEP    Time 4    Period Weeks    Status Achieved    Target Date 03/21/20             PT Long Term Goals - 08/08/20 1014      PT LONG TERM GOAL #1   Title independent with advanced HEP    Baseline learning new exercises as she progresses    Time 12    Period Weeks    Status On-going      PT LONG TERM GOAL #2   Title coccyx pain going from sit to stand and sitting </- 0-1/10    Baseline no complaints of coccyx pain    Time 12    Period Weeks    Status Achieved      PT LONG TERM GOAL #3   Title urinary leakage decreased >/= 80% due to pelvic floor strength >/= 4/5 with circular contraction and lift    Baseline pelvic floor strength is 3/5; 40% better, down to 2 pads    Time 12    Period Weeks    Status On-going      PT LONG TERM GOAL #4   Baseline 2 fingers width with very good tension; needs tactile and verbal cues to not doom the abdomen above the umbilicus    Time 12    Period Weeks    Status On-going                 Plan - 08/08/20 1009    Clinical Impression Statement Patient has not noticed a significant change in the past 2 weeks with her urinary leakage. She does not always feel her leakage and can be a constant flow. Patient continues to waer 2 panty liners per day. she does  not wake up in the middle of the night to urinate. Pelvic floor strength is 4/5 and she can feel it tighten with exercise. Patient has difficulty with quadruped lift left leg and right arm due to leaning to far the  right and not engaging the core correctly. Patinet understands hwo to brace her core for exercise correctly. She is able to do the half kneel exercises but needs verbal cues to contract her muscles so she does not lose her balance. Patient will be calling Dr. Garnette Scheuermann, urogynecologist , to see if there is sometheing else for her leakage. Patietn will be on hold at this time.    Personal Factors and Comorbidities Comorbidity 1    Comorbidities C-section 08/18/2016, 03/07/2019    Examination-Activity Limitations Continence;Sit    Examination-Participation Restrictions Community Activity;Interpersonal Relationship    Stability/Clinical Decision Making Stable/Uncomplicated    Rehab Potential Excellent    PT Frequency 1x / week    PT Duration 8 weeks    PT Treatment/Interventions Cryotherapy;Electrical Stimulation;Moist Heat;Therapeutic exercise;Therapeutic activities;Neuromuscular re-education;Ultrasound;Patient/family education;Manual techniques;Dry needling;Spinal Manipulations    PT Next Visit Plan on hold till she sees MD    PT Home Exercise Plan Access Code: [email or web address]    Consulted and Agree with Plan of Care Patient           Patient will benefit from skilled therapeutic intervention in order to improve the following deficits and impairments:  Decreased activity tolerance, Decreased strength, Increased fascial restricitons, Pain, Increased muscle spasms  Visit Diagnosis: Muscle weakness (generalized)  Unspecified lack of coordination  Coccyx pain     Problem List Patient Active Problem List   Diagnosis Date Noted  . Elevated TSH 08/31/2019  . Hypothyroidism 08/31/2019  . MDD (major depressive disorder), severe ([place]) 08/31/2019  . Gestational hypertension affecting second pregnancy 03/06/2019  . Depression 03/05/2019  . Gestational hypertension 03/05/2019  . History of cesarean delivery affecting pregnancy 03/05/2019  . Desires VBAC (vaginal birth after cesarean) trial  03/05/2019  . Acute blood loss anemia 08/19/2016  . Cesarean delivery delivered 08/18/2016  . Postpartum care following cesarean delivery (11/12) Indication: arrest of dilation 08/18/2016  . Binge eating disorder 12/12/2015  . Anxiety 05/11/2015  . Morbid obesity with BMI of 45.0-49.9, adult [facility]) 05/11/2015    Earlie Counts, PT 08/08/20 10:15 AM   [place] at [facility] [address], [place], [place], [address] Phone: [phone]   Fax:  [phone]  Name: Hannah Acosta MRN: [id number] Date of Birth: 01-04-1986  PHYSICAL THERAPY DISCHARGE SUMMARY  Visits from Start of Care: 15  Current functional level related to goals / functional outcomes: See above.   Remaining deficits: See above.    Education / Equipment: HEP Plan: Patient agrees to discharge.  Patient goals were partially met. Patient is being discharged due to                                                    maximized her potential in physical therapy at this time. Patient is seeing Dr. Windy Canny to be evaluated further. Thank you for the referral. Earlie Counts, PT 09/21/20 8:37 AM   ?????

## 2020-08-08 NOTE — Patient Instructions (Signed)
Access Code: [email or web address] URL: [email or web address] Date: 08/08/2020 Prepared by: Eulis Foster  Exercises Half Kneeling Lift - Opposite Forward Leg - 1 x daily - 4 x weekly - 2 sets - 10 reps Half Kneeling Diagonal Chops with Resistance - 1 x daily - 4 x weekly - 2 sets - 10 reps Half-Kneeling Shoulder Horizontal Abduction with Resistance - 1 x daily - 4 x weekly - 2 sets - 10 reps Plank with Hip Abduction - 1 x daily - 4 x weekly - 1 sets - 4 reps Bird Dog - 1 x daily - 4 x weekly - 2 sets - 10 reps Dead Bug - 1 x daily - 7 x weekly - 3 sets - 10 reps Sidestepping in Squat with Resistance and Arms Forward - 1 x daily - 4 x weekly - 2 sets - 10 reps Deadlift with Resistance - 1 x daily - 4 x weekly - 2 sets - 10 reps Cat-Camel to Child's Pose - 1 x daily - 7 x weekly - 1 sets - 10 reps - 30 sec hold Quadruped Thoracic Rotation with Hand on Neck - 1 x daily - 7 x weekly - 1 sets - 10 reps Supine Hamstring Stretch with Strap - 1 x daily - 7 x weekly - 1 sets - 2 reps - 30 sec hold Hip Adductors and Hamstring Stretch with Strap - 1 x daily - 7 x weekly - 1 sets - 2 reps - 30 sec hold Supine Pelvic Floor Stretch - 1 x daily - 7 x weekly - 1 sets - 1 reps - 30 sec hold Supine Transversus Abdominis Bracing - Hands on Stomach - 3 x daily - 7 x weekly - 5 reps - 5 sec hold Seated Pelvic Floor Contraction - 2 x daily - 7 x weekly - 1 sets - 5 reps - 10 sec hold Supine March with Posterior Pelvic Tilt - 1 x daily - 7 x weekly - 3 sets - 10 reps Child's Pose with Thread the Needle - 1 x daily - 7 x weekly - 3 sets - 10 reps Warrior II - 1 x daily - 7 x weekly - 3 sets - 10 reps Reverse Warrior Pose - 1 x daily - 7 x weekly - 3 sets - 10 reps Supine Transversus Abdominis Bracing - Hands on Stomach - 1 x daily - 4 x weekly - 1 sets - 10 reps Beginner Front Arm Support - 1 x daily - 4 x weekly - 1 sets - 10 reps  Patient Education Trigger Point Dry Needling Ionto Patient  Instructions Eulis Foster, PT [facility] [address], [address] [place], [place] [address] W: [phone] [email or web address]

## 2020-08-15 ENCOUNTER — Encounter: Payer: Self-pay | Admitting: Physical Therapy

## 2020-08-22 ENCOUNTER — Encounter: Payer: Self-pay | Admitting: Physical Therapy

## 2020-09-06 DIAGNOSIS — J210 Acute bronchiolitis due to respiratory syncytial virus: Secondary | ICD-10-CM

## 2020-09-06 DIAGNOSIS — Z8619 Personal history of other infectious and parasitic diseases: Secondary | ICD-10-CM

## 2020-09-06 HISTORY — DX: Personal history of other infectious and parasitic diseases: Z86.19

## 2020-09-06 HISTORY — DX: Acute bronchiolitis due to respiratory syncytial virus: J21.0

## 2020-09-13 NOTE — Progress Notes (Addendum)
[facility]ne[facility] New Patient Evaluation and Consultation  Referring Provider: Clayborn Heron, MD PCP: Clayborn Heron, MD Date of Service: 09/15/2020  SUBJECTIVE Chief Complaint: New Patient (Initial Visit) (Urinary incontinence)  History of Present Illness: Hannah Acosta is a 34 y.o. White or Caucasian female presenting for evaluation of urinary leakage.    Review of records significant for: She has been attending pelvic floor physical therapy but still continues to have urinary leakage.   Has had abt 14 pelvic floor PT appointments.   Urinary Symptoms: Leaks urine without sensation, while asleep and continuously Leaks all the time- slow drip Pad use: 2-3  liners/ mini-pads per day.   She is bothered by her UI symptoms.  Day time voids 5.  Nocturia: 2 times per night to void. Voiding dysfunction: she empties her bladder well.  does not use a catheter to empty bladder.  When urinating, she feels dribbling after finishing Drinks: 2-3 cups coffee in AM, 1 cup in afternoon, water, occasional soda per day Has been to [facility] and reported that she had a bladder backfill but does not believe she had the full urodynamic testing.  She was given a prescription for Myrbetriq and took for a month and saw no difference.  Had cut back to 2 cups of coffee per day and none after 3pm and did not have a difference in her symptoms.  Used an Development worker, community and it was not comfortable. Has also used a perifit device which did not significantly help her symptoms.   UTIs: 0 UTI's in the last year.   Denies history of blood in urine and kidney or bladder stones  Pelvic Organ Prolapse Symptoms:                  She Denies a feeling of a bulge the vaginal area.  Bowel Symptom: Bowel movements: 1-2 time(s) per day Stool consistency: hard or soft  Straining: no.  Splinting: no.  Incomplete evacuation: no.  She Denies accidental bowel leakage / fecal incontinence Bowel regimen:  none Last colonoscopy: n/a  Sexual Function Sexually active: yes.  Sexual orientation: heterosexual Pain with sex: No  Pelvic Pain Denies pelvic pain   Past Medical History:  Past Medical History:  Diagnosis Date  . Anemia    during pregnancy  . Anxiety   . Depression   . Pregnancy induced hypertension   . Thyroid disease    hashimotos     Past Surgical History:   Past Surgical History:  Procedure Laterality Date  . CESAREAN SECTION N/A 08/18/2016   Procedure: CESAREAN SECTION;  Surgeon: Genia Del, MD;  Location: [facility];  Service: Obstetrics;  Laterality: N/A;  . CESAREAN SECTION N/A 03/07/2019   Procedure: CESAREAN SECTION;  Surgeon: Osborn Coho, MD;  Location: [facility] LD [facility];  Service: Obstetrics;  Laterality: N/A;     Past OB/GYN History: G2 P2 Vaginal deliveries: 0,  Forceps/ Vacuum deliveries: 0, Cesarean section: 2 Menopausal: No, (Menstrual status: Oral contraceptives). Contraception: Salpingectomy Last pap smear was last year.  Any history of abnormal pap smears: no.   Medications: She has a current medication list which includes the following prescription(s): levonorgestrel-ethinyl estradiol, levothyroxine, and sertraline.   Allergies: Patient has No Known Allergies.   Social History:  Social History   Tobacco Use  . Smoking status: Never Smoker  . Smokeless tobacco: Never Used  Vaping Use  . Vaping Use: Never used  Substance Use Topics  . Alcohol use: No  .  Drug use: No    Relationship status: married She lives with husband and 2 children.   She is employed as a Geologist, engineering. Regular exercise: Yes: 3x week- [id number] stroller class History of abuse: No  Family History:   Family History  Problem Relation Age of Onset  . Alcohol abuse Mother   . Anxiety disorder Mother   . Hypertension Mother   . Alcohol abuse Father   . Cancer Maternal Grandmother   . Anxiety disorder Maternal Grandmother   . Hypertension Maternal  Grandmother   . Anxiety disorder Sister   . Early death Maternal Uncle      Review of Systems: Review of Systems  Constitutional: Negative for fever, malaise/fatigue and weight loss.  Respiratory: Negative for cough, shortness of breath and wheezing.   Cardiovascular: Negative for chest pain, palpitations and leg swelling.  Gastrointestinal: Negative for abdominal pain and blood in stool.  Genitourinary: Negative for dysuria.  Musculoskeletal: Negative for myalgias.  Skin: Negative for rash.  Neurological: Negative for dizziness and headaches.  Endo/Heme/Allergies: Does not bruise/bleed easily.  Psychiatric/Behavioral: Positive for depression. The patient is nervous/anxious.      OBJECTIVE Physical Exam: Vitals:   09/15/20 1410  BP: (!) 142/84  Pulse: 79  Weight: 272 lb 8 oz (123.6 kg)  Height: 5\' 5"  (1.651 m)    Physical Exam Constitutional:      General: She is not in acute distress.    Appearance: She is obese.  Pulmonary:     Effort: Pulmonary effort is normal.  Abdominal:     General: There is no distension.     Palpations: Abdomen is soft.     Tenderness: There is no abdominal tenderness. There is no rebound.  Musculoskeletal:        General: No swelling. Normal range of motion.  Skin:    General: Skin is warm and dry.     Findings: No rash.  Neurological:     Mental Status: She is alert and oriented to person, place, and time.  Psychiatric:        Mood and Affect: Mood normal.        Behavior: Behavior normal.      GU / Detailed Urogynecologic Evaluation:  Pelvic Exam: Normal external female genitalia; Bartholin's and Skene's glands normal in appearance; urethral meatus normal in appearance, no urethral masses or discharge.   CST: negative, mobile urethra  Speculum exam reveals normal vaginal mucosa without atrophy. Cervix normal appearance. Uterus normal single, nontender. Adnexa no mass, fullness, tenderness.    s/p hysterectomy: Speculum exam  reveals normal vaginal mucosa without  atrophy and normal vaginal cuff.  Adnexa no mass, fullness, tenderness.     Pelvic floor strength III/V  Pelvic floor musculature: Right levator non-tender, Right obturator non-tender, Left levator non-tender, Left obturator non-tender  POP-Q:   POP-Q  -1.5                                            Aa   -1.5                                           Ba  -6.5  C   4.5                                            Gh  5                                            Pb  9                                            tvl   -1.5                                            Ap  -1.5                                            Bp  -7                                              D     Rectal Exam:  Normal external rectum  Post-Void Residual (PVR) by Bladder Scan: In order to evaluate bladder emptying, we discussed obtaining a postvoid residual and she agreed to this procedure.  Procedure: The ultrasound unit was placed on the patient's abdomen in the suprapubic region after the patient had voided. A PVR of 4 ml was obtained by bladder scan.  Laboratory Results: POC urine: negative  I visualized the urine specimen, noting the specimen to be clear yellow  ASSESSMENT AND PLAN Ms. MEAS is a 34 y.o. with:  1. SUI (stress urinary incontinence, female)   2. Urinary incontinence without sensory awareness   3. Prolapse of anterior vaginal wall   4. Prolapse of posterior vaginal wall    Difficult to distinguish her type of incontinence based on her symptoms since she is usually unaware of leakage. Therefore, we will proceed with Urodynamic testing to better help distinguish treatment pathway. We reviewed options for both SUI and UUI and that she may have components of both.   1. SUI For treatment of stress urinary incontinence,  non-surgical options include expectant management, weight loss, physical  therapy, as well as a pessary.  Surgical options include a midurethral sling, Burch urethropexy, and transurethral injection of a bulking agent. - Has not had improvement with physical therapy.   2. Urge incontinence We discussed the symptoms of overactive bladder (OAB), which include urinary urgency, urinary frequency, nocturia, with or without urge incontinence.  While we do not know the exact etiology of OAB, several treatment options exist. We discussed management including behavioral therapy (decreasing bladder irritants, urge suppression strategies, timed voids, bladder retraining), physical therapy, medication; for refractory cases posterior tibial nerve stimulation, sacral neuromodulation, and intravesical botulinum toxin injection.  - She has not had improvement with reducing bladder irritants and Myrbetriq.   3. Stage I anterior, Stage  I posterior, Stage I apical prolapse - She is asymptomatic from her prolapse and it is unlikely to be contributing to her urinary symptoms.   Plan for urodynamic testing and return visit to further discuss treatment options.    Marguerita BeardsMichelle N Jeffie Spivack, MD   Time spent:  Approximately 60 min was spent on this encounter

## 2020-09-15 ENCOUNTER — Encounter: Payer: Self-pay | Admitting: Obstetrics and Gynecology

## 2020-09-15 ENCOUNTER — Other Ambulatory Visit: Payer: Self-pay

## 2020-09-15 ENCOUNTER — Ambulatory Visit (INDEPENDENT_AMBULATORY_CARE_PROVIDER_SITE_OTHER): Payer: Medicaid Other | Admitting: Obstetrics and Gynecology

## 2020-09-15 VITALS — BP 142/84 | HR 79 | Ht 65.0 in | Wt 272.5 lb

## 2020-09-15 DIAGNOSIS — N8110 Cystocele, unspecified: Secondary | ICD-10-CM

## 2020-09-15 DIAGNOSIS — N3942 Incontinence without sensory awareness: Secondary | ICD-10-CM

## 2020-09-15 DIAGNOSIS — N816 Rectocele: Secondary | ICD-10-CM

## 2020-09-15 DIAGNOSIS — N393 Stress incontinence (female) (male): Secondary | ICD-10-CM

## 2020-09-15 DIAGNOSIS — N3281 Overactive bladder: Secondary | ICD-10-CM | POA: Diagnosis not present

## 2020-09-15 LAB — POCT URINALYSIS DIPSTICK
Appearance: NORMAL
Bilirubin, UA: NEGATIVE
Blood, UA: NEGATIVE
Glucose, UA: NEGATIVE
Ketones, UA: NEGATIVE
Leukocytes, UA: NEGATIVE
Nitrite, UA: NEGATIVE
Protein, UA: NEGATIVE
Spec Grav, UA: 1.02 (ref 1.010–1.025)
Urobilinogen, UA: 0.2 E.U./dL
pH, UA: 5 (ref 5.0–8.0)

## 2020-09-15 NOTE — Addendum Note (Signed)
Addended by: Marguerita Beards on: 09/15/2020 03:13 PM   Modules accepted: Level of Service

## 2020-09-15 NOTE — Patient Instructions (Signed)
For treatment of stress urinary incontinence, which is leakage with physical activity/movement/strainging/coughing, we discussed expectant management versus nonsurgical options versus surgery. Nonsurgical options include weight loss, physical therapy, as well as a pessary.  Surgical options include a midurethral sling, which is a synthetic mesh sling that acts like a hammock under the urethra to prevent leakage of urine, and transurethral injection of a bulking agent.   We discussed the symptoms of overactive bladder (OAB), which include urinary urgency, urinary frequency, night-time urination, with or without urge incontinence.  We discussed management including behavioral therapy (decreasing bladder irritants by following a bladder diet, urge suppression strategies, timed voids, bladder retraining), physical therapy, medication; and for refractory cases posterior tibial nerve stimulation, sacral neuromodulation, and intravesical botulinum toxin injection.    URODYNAMICS (UDS) TEST INFORMATION  IMPORTANT: Please try to arrive with a comfortably full bladder!    What is UDS? Marland Kitchen Urodynamics is a bladder test used to evaluate how your bladder and urethra (tube you urinate out of) work to help find out the cause of your bladder symptoms and evaluate your bladder function in order to make the best treatment plan for you.   What to expect? . A nurse will perform the test and will be with you during the entire exam. . First we will have to empty your bladder on a special toilet.  After you have emptied your bladder, very small catheters (plastic tubing) will be placed into your bladder and into your vagina (or rectum). These special small catheters measure pressure to help measure your bladder function.  Your bladder will be gently filled with water and you will be asked to cough and strain at several different points during the test.   . You will then be asked to empty your bladder in the special toilet  with the catheters in place. Most patients can urinate (pee) easily with the catheters in place since the catheters are so small. . In total this procedure lasts about 45 minutes to 1 hour.  . After your test is completed, you will return (or possibly be seen the same day) to review the results, talk about treatment options and make a plan moving forward.

## 2020-10-17 NOTE — Progress Notes (Signed)
[facility] Urodynamics Procedure  Referring Physician: Clayborn Heron, MD Date of Procedure: 10/20/2020  Hannah Acosta is a 35 y.o. female who presents for urodynamic evaluation. Indication(s) for study: mixed incontinence  Vital Signs: BP (!) 154/86   Pulse 81   Ht 5\' 5"  (1.651 m)   Wt 280 lb (127 kg)   BMI 46.59 kg/m   Laboratory Results: A catheterized urine specimen revealed:  POC urine negative  Voiding Diary: Not completed.   Procedure Timeout:  The correct patient was verified and the correct procedure was verified. The procedure consent form was reviewed and accurate. The patient was in the correct position and safety precautions were reviewed based on at the patient's history.   Urodynamic Procedure A 56F dual lumen urodynamics catheter was placed under sterile conditions into the patient's bladder. A 56F catheter was placed into the vagina in order to measure abdominal pressure. EMG patches were placed in the appropriate position.  All connections were confirmed and calibrations/adjusted made. Saline was instilled into the bladder through the dual lumen catheters.  Cough/valsalva pressures were measured periodically during filling.  Patient was allowed to void.  The bladder was then emptied of its residual.  UROFLOW: Revealed a Qmax of 16 mL/sec.  She voided 352 mL and had a residual of 95 mL.  It was a normal pattern and represented normal habits.  CMG: This was performed with sterile water in the sitting position at a fill rate of 30 and increased to 50 mL/min.    First sensation of fullness was 40 mLs,  First urge was 98 mLs,  Strong urge was 270 mLs and  Capacity was 286 mLs  Stress incontinence was demonstrated Highest negative CLPP was 227 cmH20 at 200 ml. Lowest positive VLPP was 99 cmH20at 200 ml.  Detrusor function was not overactive, with no phasic contractions seen.    Compliance:  Normal. Max Pdet was 8.5 with calculated compliance  was 32 .   UPP: MUCP without barrier reduction was 15 cm of water and functional urethral length was 55 mm.    MICTURITION STUDY: Voiding was performed without reduction in the sitting position.  Pdet at Qmax was 0 cm of water.  Qmax was 2.3 mL/sec.  It was a normal pattern, although very low volume.  She voided 11 mL and had a residual of 275 mL.  It was a volitional void, sustained detrusor contraction was not present and abdominal straining was not present  EMG: This was performed with patches.  She had voluntary contractions, recruitment with fill was present and urethral sphincter was relaxed with void.  The details of the procedure with the study tracings have been scanned into EPIC.   Urodynamic Impression:  1. Sensation was normal; capacity was reduced 2. Stress Incontinence was demonstrated at normal pressures; MUCP showed ISD pressures however there was a catheter error and this may not be accurate. 3. Detrusor Overactivity was not demonstrated. 4. Emptying was dysfunctional with a elevated PVR, a sustained detrusor contraction not present,  abdominal straining not present, normal urethral sphincter activity on EMG.  Plan: - She will self-catheterize for the next few days after voiding and record PVRs. Self-cath teaching and instructions provided.  - The patient will follow up  to discuss the findings and treatment options.    [id number], MD

## 2020-10-20 ENCOUNTER — Encounter: Payer: Self-pay | Admitting: Obstetrics and Gynecology

## 2020-10-20 ENCOUNTER — Other Ambulatory Visit: Payer: Self-pay

## 2020-10-20 ENCOUNTER — Ambulatory Visit (INDEPENDENT_AMBULATORY_CARE_PROVIDER_SITE_OTHER): Payer: Medicaid Other | Admitting: Obstetrics and Gynecology

## 2020-10-20 VITALS — BP 154/86 | HR 81 | Ht 65.0 in | Wt 280.0 lb

## 2020-10-20 DIAGNOSIS — R339 Retention of urine, unspecified: Secondary | ICD-10-CM

## 2020-10-20 DIAGNOSIS — N393 Stress incontinence (female) (male): Secondary | ICD-10-CM | POA: Diagnosis not present

## 2020-10-20 NOTE — Patient Instructions (Addendum)
Instructions for catheterization:   Measure how much you urinate in a hat. Then empty hat and catheterize to measure how much is left in bladder.  Call on Tuesday with amounts you voided and amounts you catheterized.    Taking Care of Yourself after Urodynamics, Cystoscopy, Coaptite Injection, or Botox Injection    .   Drink plenty of water for a day or two following your procedure. Try to have about 8 ounces (one cup) at a time, and do this 6 times or more per day unless you have fluid restrictitons  .   AVOID irritative beverages such as coffee, tea, soda, alcoholic or citrus drinks for a day or two, as this may cause burning with urination.  . For the first 1-2 days after the procedure, your urine may be pink or red in color. You may have some blood in your urine as a normal side effect of the procedure. Large amounts of bleeding or difficulty urinating are NOT normal. Call the nurse line if this happens or go to the nearest Emergency Room if the bleeding is heavy or you cannot urinate at all and it is after hours. If you had a Coaptite injection in the urethra and need to be catheterized afterward, ask for a pediatric catheter to be used (size 10 or 12-French) so the Coaptite material is not pushed out of place.    Bonita Quin may experience some discomfort or a burning sensation with urination after having this procedure. You can use over the counter Azo or pyridium to help with burning and follow the instructions on the packaging. If it does not improve within 1-2 days, or other symptoms appear (fever, chills, or difficulty urinating) call the office to speak to a nurse.   .   You may return to normal daily activities such as work, school, driving, exercising and housework on the day of the procedure.  .  If your doctor gave you a prescription, take it as ordered.   .   If you need a return appointment, the front desk staff will arrange it when you check out.

## 2020-10-24 ENCOUNTER — Telehealth: Payer: Self-pay | Admitting: Obstetrics and Gynecology

## 2020-10-24 LAB — POCT URINALYSIS DIPSTICK (MANUAL)
Leukocytes, UA: NEGATIVE
Nitrite, UA: NEGATIVE
Poct Bilirubin: NEGATIVE
Poct Blood: NEGATIVE
Poct Glucose: NORMAL mg/dL
Poct Ketones: NEGATIVE
Poct Protein: NEGATIVE mg/dL
Poct Urobilinogen: NORMAL mg/dL
pH, UA: 5 (ref 5.0–8.0)

## 2020-10-24 NOTE — Telephone Encounter (Signed)
Reviewed voided and cath volumes with patient. She only catheterized a few times due to discomfort.   Fri 14th- voided , cath 1-23ml  Sat 15th- voided , cath 1-62ml  Voided 100 ml, cath 13ml  Sun 16th- voided , cath 3-86ml  Voided , cath 10-38ml   She will collect another full day of catheterizing after voiding before her next appointment and we will review at that time.   Marguerita Beards, MD

## 2020-10-27 NOTE — Progress Notes (Addendum)
[facility] Return Visit- Video  The patient consented for a virtual video visit and was identified with name and DOB. Patient's location was at home in [place]. I was located in clinic ([place]).   SUBJECTIVE  History of Present Illness: Hannah Acosta is a 35 y.o. female seen in follow-up after urodynamic testing.   Urodynamic Impression:  1. Sensation was normal; capacity was reduced 2. Stress Incontinence was demonstrated at normal pressures; MUCP showed ISD pressures. 3. Detrusor Overactivity was not demonstrated. 4. Emptying was dysfunctional with a elevated PVR, a sustained detrusor contraction not present,  abdominal straining not present, normal urethral sphincter activity on EMG.  Patient self-catheterized at home for 4 days to better assess incomplete emptying. She had normal PVRs at home.   She also catheterized yesterday twice: Voided - cath 73ml Voided - cath 82ml   Past Medical History: Patient  has a past medical history of Anemia, Anxiety, Depression, Pregnancy induced hypertension, RSV (acute bronchiolitis due to respiratory syncytial virus) (09/2020), and Thyroid disease.   Past Surgical History: She  has a past surgical history that includes Cesarean section (N/A, 08/18/2016) and Cesarean section (N/A, 03/07/2019).   Medications: She has a current medication list which includes the following prescription(s): levonorgestrel-ethinyl estradiol, levothyroxine, and sertraline.   Allergies: Patient has No Known Allergies.   Social History: Patient  reports that she has never smoked. She has never used smokeless tobacco. She reports that she does not drink alcohol and does not use drugs.      OBJECTIVE     Physical Exam: There were no vitals filed for this visit. Gen: No apparent distress, A&O x 3.  Detailed Urogynecologic Evaluation:  Deferred.    ASSESSMENT AND PLAN    Hannah Acosta is a 35 y.o. with:  1. Intrinsic sphincter deficiency (ISD)    2. SUI (stress urinary incontinence, female)    SUI due to ISD -For treatment of stress urinary incontinence,  non-surgical options include expectant management, weight loss, physical therapy, as well as a pessary.  Surgical options include a midurethral sling,  and transurethral injection of a bulking agent. - She has not had improvement with physical therapy. She would like to try something with minimal down time, therefore she is interested in the urethral bulking. We discussed the success rate of 60-70%, and need for possible repeat injection. Also reviewed the risks and benefits in detail.   We will contact her insurance company to confirm coverage and call her to schedule the procedure.   Marguerita Beards, MD  Time spent: I spent 25 minutes dedicated to the care of this patient on the date of this encounter to include pre-visit review of records, face-to-face time with the patient and post visit documentation.

## 2020-10-31 ENCOUNTER — Telehealth (INDEPENDENT_AMBULATORY_CARE_PROVIDER_SITE_OTHER): Payer: Medicaid Other | Admitting: Obstetrics and Gynecology

## 2020-10-31 ENCOUNTER — Encounter: Payer: Self-pay | Admitting: Obstetrics and Gynecology

## 2020-10-31 DIAGNOSIS — N393 Stress incontinence (female) (male): Secondary | ICD-10-CM | POA: Diagnosis not present

## 2020-10-31 DIAGNOSIS — N3642 Intrinsic sphincter deficiency (ISD): Secondary | ICD-10-CM | POA: Diagnosis not present

## 2020-10-31 NOTE — Patient Instructions (Signed)
Here is the link for the urethral bulking procedure. Let us know if you are unable to view:  [email or web address]

## 2020-11-14 ENCOUNTER — Telehealth: Payer: Self-pay | Admitting: *Deleted

## 2020-11-14 NOTE — Telephone Encounter (Signed)
Called Hannah Acosta to set up urethral bulking appt after speaking with insurance. DOB verified.  Advised that according to insurance prior-authorization was not needed (ref [id number]) and that she would have a $3 copay (ref [id number]).  Pt scheduled for March 11 at 3:30.  Advised to call with any questions or concerns. Pt verbalized understanding.

## 2020-11-27 LAB — THYROID PEROXIDASE (TPO) AB: Thyroid Peroxidase (TPO) Ab: 600

## 2020-12-13 ENCOUNTER — Telehealth: Payer: Self-pay | Admitting: *Deleted

## 2020-12-13 ENCOUNTER — Other Ambulatory Visit: Payer: Self-pay | Admitting: Obstetrics and Gynecology

## 2020-12-13 DIAGNOSIS — Z01818 Encounter for other preprocedural examination: Secondary | ICD-10-CM

## 2020-12-13 MED ORDER — SULFAMETHOXAZOLE-TRIMETHOPRIM 800-160 MG PO TABS: 1.0000 | ORAL_TABLET | Freq: Once | ORAL | 0 refills | Status: AC

## 2020-12-13 NOTE — Progress Notes (Signed)
1 tab Bactrim ordered for pre-procedure prophylaxis for urethral bulking on 12/15/20.

## 2020-12-13 NOTE — Telephone Encounter (Signed)
DOB verified. Informed pt that antibiotic had been sent to her pharmacy. Advised pt to take the medicine prior to her appointment on Friday. Pt verbalized understanding.

## 2020-12-14 NOTE — Progress Notes (Signed)
COAPTITE INJECTION  CC: 35 y.o. F with stress incontinence who presents for transurethral Coaptite injection.  Patient signed her consent form.  She started antibiotic prophylaxis today- 1 tab bactrim DS.  Procedure: Time out was performed. The bladder was catheterized and 10 ml of 2% lidocaine jelly placed in the urethra. After 15 minutes the cystoscopy was performed with sterile H2O and a 12-degree scope. Urethral mucosa was noted to be within normal limits. The Sidekick needle was primed, advanced and inserted into the urethral mucosa just distal to the urethral sphincter at 2, 5 and 9 o'clock.  A total of 1 syringes of Coaptite, Lot# [id number], Exp 05/28/23,  were injected and good circumferential coaptation was noted. The needle was left in place for 60 seconds and then removed, with no leakage of Coaptite and no bleeding. The patient tolerated the procedure well. She was asked to void after the procedure.  Post-Void Residual (PVR) by Bladder Scan: In order to evaluate bladder emptying, we discussed obtaining a postvoid residual and she agreed to this procedure.  Procedure: The ultrasound unit was placed on the patient's abdomen in the suprapubic region after the patient had voided. A PVR of 32 ml was obtained by bladder scan.  ASSESSMENT: 35 y.o. s/p transurethral Coaptite injection for stress incontinence.   PLAN: Patient will follow up in 4 weeks to reassess. Voiding and post-procedure precautions were given. She will return for heavy bleeding, fevers, dysuria lasting beyond today and incomplete emptying.  All questions were answered.  Marguerita Beards, MD

## 2020-12-15 ENCOUNTER — Ambulatory Visit (INDEPENDENT_AMBULATORY_CARE_PROVIDER_SITE_OTHER): Payer: Medicaid Other | Admitting: Obstetrics and Gynecology

## 2020-12-15 ENCOUNTER — Encounter: Payer: Self-pay | Admitting: Obstetrics and Gynecology

## 2020-12-15 ENCOUNTER — Other Ambulatory Visit: Payer: Self-pay

## 2020-12-15 VITALS — BP 144/84 | HR 97 | Wt 280.0 lb

## 2020-12-15 DIAGNOSIS — N393 Stress incontinence (female) (male): Secondary | ICD-10-CM

## 2020-12-15 NOTE — Patient Instructions (Signed)

## 2020-12-20 ENCOUNTER — Other Ambulatory Visit: Payer: Self-pay | Admitting: Obstetrics and Gynecology

## 2020-12-20 DIAGNOSIS — R300 Dysuria: Secondary | ICD-10-CM

## 2020-12-20 MED ORDER — NITROFURANTOIN MONOHYD MACRO 100 MG PO CAPS: 100.0000 mg | ORAL_CAPSULE | Freq: Two times a day (BID) | ORAL | 0 refills | Status: AC

## 2020-12-20 NOTE — Progress Notes (Signed)
Patient called stating she is having some blood in her urine and occasional difficulty voiding where she strains. Other times she empties her bladder fine.  Yesterday her urgency has increased. Will treat for urinary tract infection since she is s/p urethral bulking procedure on 12/15/20. Macrobid 100 BID x5 days sent to pharmacy. Patient will let us know if symptoms do not improve. Has follow up scheduled on 01/15/21.  Marguerita Beards, MD

## 2021-01-11 NOTE — Progress Notes (Signed)
[facility] Return Visit  SUBJECTIVE  History of Present Illness: Hannah Acosta is a 35 y.o. female presenting for follow up after urethral bulking performed on 12/15/20. Several days after she developed increased urgency and was treated for a urinary tract infection with macrobid 100 BID x 5 days.   Has had some improvement after the procedure, but mostly just notices that her urine stream is slower. Tried to go without a panty liner but still was wet, used less overall though.   Past Medical History: Patient  has a past medical history of Anemia, Anxiety, Depression, Pregnancy induced hypertension, RSV (acute bronchiolitis due to respiratory syncytial virus) (09/2020), and Thyroid disease.   Past Surgical History: She  has a past surgical history that includes Cesarean section (N/A, 08/18/2016) and Cesarean section (N/A, 03/07/2019).   Medications: She has a current medication list which includes the following prescription(s): levonorgestrel-ethinyl estradiol, levothyroxine, and sertraline.   Allergies: Patient has No Known Allergies.   Social History:  Patient  reports that she has never smoked. She has never used smokeless tobacco. She reports that she does not drink alcohol and does not use drugs.      OBJECTIVE     Physical Exam: Vitals:   01/15/21 1403  BP: 132/84  Pulse: 80  Weight: 280 lb (127 kg)   Gen: No apparent distress, A&O x 3.  Detailed Urogynecologic Evaluation:  Deferred. Prior exam showed:  POP-Q (09/15/20):   POP-Q  -1.5                                            Aa   -1.5                                           Ba  -6.5                                              C   4.5                                            Gh  5                                            Pb  9                                            tvl   -1.5                                            Ap  -1.5  Bp  -7                                              D       ASSESSMENT AND PLAN    Hannah Acosta is a 35 y.o. with:  1. SUI (stress urinary incontinence, female)    - She would be interested in pursuing a midurethral sling since she did not see the improvement she had hoped with the urethral bulking. We also discussed the option of doing a second bulking procedure.  - She has been looking to also have her diastasis recti corrected. Has done physical therapy for this as well but did not see significant improvement. Will refer to [facility] specialists to discuss this option. We reviewed that if they were doing a procedure, I could possibly plan to do a sling at the same time.   Referral placed to address the diastasis. Will plan for sling surgery late summer depending on plan for other surgery.   Marguerita Beards, MD  Time spent: I spent 20 minutes dedicated to the care of this patient on the date of this encounter to include pre-visit review of records, face-to-face time with the patient discussing options for SUI and post visit documentation.

## 2021-01-15 ENCOUNTER — Ambulatory Visit (INDEPENDENT_AMBULATORY_CARE_PROVIDER_SITE_OTHER): Payer: Medicaid Other | Admitting: Obstetrics and Gynecology

## 2021-01-15 ENCOUNTER — Other Ambulatory Visit: Payer: Self-pay

## 2021-01-15 ENCOUNTER — Encounter: Payer: Self-pay | Admitting: Obstetrics and Gynecology

## 2021-01-15 VITALS — BP 132/84 | HR 80 | Wt 280.0 lb

## 2021-01-15 DIAGNOSIS — M6208 Separation of muscle (nontraumatic), other site: Secondary | ICD-10-CM

## 2021-01-15 DIAGNOSIS — N393 Stress incontinence (female) (male): Secondary | ICD-10-CM | POA: Diagnosis not present

## 2021-02-27 ENCOUNTER — Ambulatory Visit (HOSPITAL_COMMUNITY)
Admission: EM | Admit: 2021-02-27 | Discharge: 2021-02-27 | Disposition: A | Discharge status: Home / Self Care | Payer: Medicaid Other | Attending: Family Medicine | Admitting: Family Medicine

## 2021-02-27 ENCOUNTER — Other Ambulatory Visit: Payer: Self-pay

## 2021-02-27 ENCOUNTER — Encounter (HOSPITAL_COMMUNITY): Payer: Self-pay

## 2021-02-27 ENCOUNTER — Ambulatory Visit (INDEPENDENT_AMBULATORY_CARE_PROVIDER_SITE_OTHER): Payer: Medicaid Other

## 2021-02-27 DIAGNOSIS — M545 Low back pain, unspecified: Secondary | ICD-10-CM

## 2021-02-27 LAB — POCT URINALYSIS DIPSTICK, ED / UC
Bilirubin Urine: NEGATIVE
Glucose, UA: NEGATIVE mg/dL
Hgb urine dipstick: NEGATIVE
Ketones, ur: NEGATIVE mg/dL
Leukocytes,Ua: NEGATIVE
Nitrite: NEGATIVE
Protein, ur: NEGATIVE mg/dL
Specific Gravity, Urine: 1.025 (ref 1.005–1.030)
Urobilinogen, UA: 0.2 mg/dL (ref 0.0–1.0)
pH: 7 (ref 5.0–8.0)

## 2021-02-27 MED ORDER — KETOROLAC TROMETHAMINE 60 MG/2ML IM SOLN
INTRAMUSCULAR | Status: AC
  Filled 2021-02-27: qty 2

## 2021-02-27 MED ORDER — KETOROLAC TROMETHAMINE 60 MG/2ML IM SOLN
60.0000 mg | Freq: Once | INTRAMUSCULAR | Status: AC
  Administered 2021-02-27: 60 mg via INTRAMUSCULAR

## 2021-02-27 MED ORDER — CYCLOBENZAPRINE HCL 10 MG PO TABS: 5.0000 mg | ORAL_TABLET | Freq: Two times a day (BID) | ORAL | 0 refills | Status: DC | PRN

## 2021-02-27 NOTE — ED Triage Notes (Signed)
Pt presents with severe back pain across her lower back X 2 days that is unrelieved with OTC medication.

## 2021-02-27 NOTE — ED Provider Notes (Signed)
[facility]    CSN: [id number] Arrival date & time: 02/27/21  1025      History   Chief Complaint Chief Complaint  Patient presents with  . Back Pain    HPI Hannah Acosta is a 35 y.o. female.   Presenting today with new onset bilateral low back pain x2 days.  She states it initially felt like her menstrual cycle pains and associated with nausea, pelvic cramping but now is just bilateral low back pain.  She states she was just trying to rest and take over-the-counter medication until this morning when she tried to get out of bed, the pain was worse and she felt like her legs went out from under her and she fell several times.  This caused her to be concerned and come into the urgent care for further evaluation.  She states since leaving her house she has been able to walk again fairly normally and no longer has any numbness, tingling, weakness in her legs.  She denies any injury to the area, recent strenuous activity, bowel or bladder incontinence, fever, known history of spinal issues.  She does have a history of sciatica but notes it felt a bit different than this.     Past Medical History:  Diagnosis Date  . Anemia    during pregnancy  . Anxiety   . Depression   . Pregnancy induced hypertension   . RSV (acute bronchiolitis due to respiratory syncytial virus) 09/2020  . Thyroid disease    hashimotos    Patient Active Problem List   Diagnosis Date Noted  . Elevated TSH 08/31/2019  . Hypothyroidism 08/31/2019  . MDD (major depressive disorder), severe (HCC) 08/31/2019  . Gestational hypertension affecting second pregnancy 03/06/2019  . Depression 03/05/2019  . Gestational hypertension 03/05/2019  . History of cesarean delivery affecting pregnancy 03/05/2019  . Desires VBAC (vaginal birth after cesarean) trial 03/05/2019  . Acute blood loss anemia 08/19/2016  . Cesarean delivery delivered 08/18/2016  . Postpartum care following cesarean delivery (11/12)  Indication: arrest of dilation 08/18/2016  . Binge eating disorder 12/12/2015  . Anxiety 05/11/2015  . Morbid obesity with BMI of 45.0-49.9, adult (HCC) 05/11/2015    Past Surgical History:  Procedure Laterality Date  . CESAREAN SECTION N/A 08/18/2016   Procedure: CESAREAN SECTION;  Surgeon: Genia Del, MD;  Location: [facility];  Service: Obstetrics;  Laterality: N/A;  . CESAREAN SECTION N/A 03/07/2019   Procedure: CESAREAN SECTION;  Surgeon: Osborn Coho, MD;  Location: [facility] LD ORS;  Service: Obstetrics;  Laterality: N/A;    OB History    Gravida  2   Para  2   Term  2   Preterm      AB      Living  2     SAB      IAB      Ectopic      Multiple  0   Live Births  2            Home Medications    Prior to Admission medications   Medication Sig Start Date End Date Taking? Authorizing Provider  cyclobenzaprine (FLEXERIL) 10 MG tablet Take 0.5-1 tablets (5-10 mg total) by mouth 2 (two) times daily as needed for muscle spasms. Do not drink alcohol or drive while taking this medication.  May cause drowsiness. 02/27/21  Yes Particia Nearing, PA-C  Levonorgestrel-Ethinyl Estradiol (AMETHIA) 0.1-0.02 & 0.01 MG tablet Take 1 tablet by mouth daily.  [provider]  levothyroxine (SYNTHROID) 200 MCG tablet Take 1 tablet (200 mcg total) by mouth daily at 6 (six) AM. Patient taking differently: Take 150 mcg by mouth daily at 6 (six) AM. 09/05/19   Aldean Baker, NP  sertraline (ZOLOFT) 100 MG tablet Take 1 tablet (100 mg total) by mouth daily. Patient taking differently: Take 200 mg by mouth daily. 09/05/19   Aldean Baker, NP    Family History Family History  Problem Relation Age of Onset  . Alcohol abuse Mother   . Anxiety disorder Mother   . Hypertension Mother   . Alcohol abuse Father   . Cancer Maternal Grandmother   . Anxiety disorder Maternal Grandmother   . Hypertension Maternal Grandmother   . Anxiety disorder Sister   .  Early death Maternal Uncle     Social History Social History   Tobacco Use  . Smoking status: Never Smoker  . Smokeless tobacco: Never Used  Vaping Use  . Vaping Use: Never used  Substance Use Topics  . Alcohol use: No  . Drug use: No     Allergies   Patient has no known allergies.   Review of Systems Review of Systems Per HPI  Physical Exam Triage Vital Signs ED Triage Vitals  Enc Vitals Group     BP 02/27/21 1155 120/69     Pulse Rate 02/27/21 1155 76     Resp 02/27/21 1155 18     Temp 02/27/21 1155 (!) 97.5 F (36.4 C)     Temp Source 02/27/21 1155 Oral     SpO2 02/27/21 1155 99 %     Weight --      Height --      Head Circumference --      Peak Flow --      Pain Score 02/27/21 1152 8     Pain Loc --      Pain Edu? --      Excl. in GC? --    No data found.  Updated Vital Signs BP 120/69 (BP Location: Right Arm)   Pulse 76   Temp (!) 97.5 F (36.4 C) (Oral)   Resp 18   SpO2 99%   Visual Acuity Right Eye Distance:   Left Eye Distance:   Bilateral Distance:    Right Eye Near:   Left Eye Near:    Bilateral Near:     Physical Exam Vitals and nursing note reviewed.  Constitutional:      Appearance: Normal appearance. She is not ill-appearing.  HENT:     Head: Atraumatic.     Nose: Nose normal.     Mouth/Throat:     Mouth: Mucous membranes are moist.     Pharynx: Oropharynx is clear.  Eyes:     Extraocular Movements: Extraocular movements intact.     Conjunctiva/sclera: Conjunctivae normal.  Cardiovascular:     Rate and Rhythm: Normal rate and regular rhythm.     Heart sounds: Normal heart sounds.  Pulmonary:     Effort: Pulmonary effort is normal. No respiratory distress.     Breath sounds: Normal breath sounds. No wheezing or rales.  Abdominal:     General: Bowel sounds are normal. There is no distension.     Palpations: Abdomen is soft.     Tenderness: There is no abdominal tenderness. There is no right CVA tenderness, left CVA  tenderness or guarding.  Musculoskeletal:        General: Tenderness present. No swelling or  signs of injury. Normal range of motion.     Cervical back: Normal range of motion and neck supple.     Comments: Good range of motion in all directions diffusely across spine No midline spinal tenderness diffusely.  Negative straight leg raise bilaterally.  Normal gait. Very minimal bilateral lumbar paraspinal muscle tenderness palpation  Skin:    General: Skin is warm and dry.     Findings: No bruising or erythema.  Neurological:     Mental Status: She is alert and oriented to person, place, and time.  Psychiatric:        Mood and Affect: Mood normal.        Thought Content: Thought content normal.        Judgment: Judgment normal.      UC Treatments / Results  Labs (all labs ordered are listed, but only abnormal results are displayed) Labs Reviewed  POCT URINALYSIS DIPSTICK, ED / UC    EKG   Radiology DG Lumbar Spine Complete  Result Date: 02/27/2021 CLINICAL DATA:  Acute low back pain. EXAM: LUMBAR SPINE - COMPLETE 4+ VIEW COMPARISON:  None. FINDINGS: There is no evidence of lumbar spine fracture. Alignment is normal. Intervertebral disc spaces are maintained. IMPRESSION: Negative. Electronically Signed   By: Lupita Raider M.D.   On: 02/27/2021 13:27    Procedures Procedures (including critical care time)  Medications Ordered in UC Medications  ketorolac (TORADOL) injection 60 mg (60 mg Intramuscular Given 02/27/21 1354)    Initial Impression / Assessment and Plan / UC Course  I have reviewed the triage vital signs and the nursing notes.  Pertinent labs & imaging results that were available during my care of the patient were reviewed by me and considered in my medical decision making (see chart for details).     Vital signs benign, exam overall reassuring without any red flag signs such as for cauda equina syndrome or a spinal fracture.  UA benign, lumbar spinal film  also without acute abnormality.  Will treat with Flexeril, IM Toradol given in clinic.  Discussed over-the-counter pain relievers, rest, Epsom salt soaks, gentle stretches.  Strict return precautions for acutely worsening symptoms.  Final Clinical Impressions(s) / UC Diagnoses   Final diagnoses:  Acute bilateral low back pain, unspecified whether sciatica present   Discharge Instructions   None    ED Prescriptions    Medication Sig Dispense Auth. Provider   cyclobenzaprine (FLEXERIL) 10 MG tablet Take 0.5-1 tablets (5-10 mg total) by mouth 2 (two) times daily as needed for muscle spasms. Do not drink alcohol or drive while taking this medication.  May cause drowsiness. 15 tablet Particia Nearing, [place]     PDMP not reviewed this encounter.   Particia Nearing, [place] 02/27/21 1606

## 2021-03-15 ENCOUNTER — Ambulatory Visit (INDEPENDENT_AMBULATORY_CARE_PROVIDER_SITE_OTHER): Payer: Medicaid Other | Admitting: Plastic Surgery

## 2021-03-15 ENCOUNTER — Other Ambulatory Visit: Payer: Self-pay

## 2021-03-15 ENCOUNTER — Encounter: Payer: Self-pay | Admitting: Plastic Surgery

## 2021-03-15 VITALS — BP 140/90 | HR 96 | Ht 65.0 in | Wt 283.0 lb

## 2021-03-15 DIAGNOSIS — M6208 Separation of muscle (nontraumatic), other site: Secondary | ICD-10-CM | POA: Diagnosis not present

## 2021-03-15 NOTE — Progress Notes (Signed)
Referring Provider Marguerita Beards, MD [address] [place],  [place] [address]   CC:  Chief Complaint  Patient presents with   consult      Hannah Acosta is an 35 y.o. female.  HPI: Patient presents to discuss her abdomen.  She has plans to do an upcoming urethral sling procedure for incontinence and was wondering if an abdominal contouring procedure could be done at the same time.  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  She does not have any particular abdominal pain.  She does not have rashes or skin irritation that require treatment.  Only previous abdominal procedures are 2 C-sections.  No Known Allergies  Outpatient Encounter Medications as of 03/15/2021  Medication Sig   Calcium-Vitamin D ([employer] CALCIUM-600/VIT D PO) Take by mouth.   ibuprofen (ADVIL) 800 MG tablet Take 800 mg by mouth every 8 (eight) hours as needed.   Levonorgestrel-Ethinyl Estradiol (AMETHIA) 0.1-0.02 & 0.01 MG tablet Take 1 tablet by mouth daily.   levothyroxine (SYNTHROID) 200 MCG tablet Take 1 tablet (200 mcg total) by mouth daily at 6 (six) AM. (Patient taking differently: Take 150 mcg by mouth daily at 6 (six) AM.)   Multiple Vitamin (MULTI-VITAMIN PO) Take by mouth.   sertraline (ZOLOFT) 100 MG tablet Take 1 tablet (100 mg total) by mouth daily. (Patient taking differently: Take 200 mg by mouth daily.)   cyclobenzaprine (FLEXERIL) 10 MG tablet Take 0.5-1 tablets (5-10 mg total) by mouth 2 (two) times daily as needed for muscle spasms. Do not drink alcohol or drive while taking this medication.  May cause drowsiness.   No facility-administered encounter medications on file as of 03/15/2021.     Past Medical History:  Diagnosis Date   Anemia    during pregnancy   Anxiety    Depression    Pregnancy induced hypertension    RSV (acute bronchiolitis due to respiratory syncytial virus) 09/2020   Thyroid disease     hashimotos    Past Surgical History:  Procedure Laterality Date   CESAREAN SECTION N/A 08/18/2016   Procedure: CESAREAN SECTION;  Surgeon: Genia Del, MD;  Location: [facility];  Service: Obstetrics;  Laterality: N/A;   CESAREAN SECTION N/A 03/07/2019   Procedure: CESAREAN SECTION;  Surgeon: Osborn Coho, MD;  Location: [facility];  Service: Obstetrics;  Laterality: N/A;    Family History  Problem Relation Age of Onset   Alcohol abuse Mother    Anxiety disorder Mother    Hypertension Mother    Alcohol abuse Father    Cancer Maternal Grandmother    Anxiety disorder Maternal Grandmother    Hypertension Maternal Grandmother    Anxiety disorder Sister    Early death Maternal Uncle     Social History   Social History Narrative   Not on file     Review of Systems General: Denies fevers, chills, weight loss CV: Denies chest pain, shortness of breath, palpitations  Physical Exam Vitals with BMI 03/15/2021 02/27/2021 01/15/2021  Height 5\' 5"  - -  Weight 283 lbs - 280 lbs  BMI 47.09 - -  Systolic 140 120  Diastolic 90 69 84  Pulse 96 76 80  Some encounter information is confidential and restricted. Go to Review Flowsheets activity to see all data.    General:  No acute distress,  Alert and oriented, Non-Toxic, Normal speech and  affect Abdomen: Abdomen is soft and nontender.  I do not appreciate any hernias.  It is possible that she has rectus diastases but is hard to tell with certainty.  She does have an infraumbilical pannus with moderate to severe skin and adipose excess.  No obvious scars in the upper abdomen.  Assessment/Plan Patient presents to discuss abdominal contouring in conjunction with her sling procedure.  We discussed infraumbilical panniculectomy and abdominoplasty.  I explained that I have not had success getting insurance to cover operations for rectus diastases despite multiple attempts in the past.  If she did have that it would be best  repaired in conjunction with an abdominoplasty type operation which would also have a cosmetic component.  We her current BMI of 47 would put her at high risk for wound healing complications and infections after a full abdominoplasty.  I am not sure she would meet the criteria for insurance coverage of an infraumbilical panniculectomy at this point neither.  We did discuss broadly what the cosmetic charges would be for these operations.  The patient wants to hold off on any cosmetic operations for now.  She will plan to go ahead and have her sling and then will call us back if she has any additional questions or concerns or if she is interested in another evaluation.  All her questions were answered.  [name] 03/15/2021, 5:59 PM

## 2021-05-15 ENCOUNTER — Other Ambulatory Visit: Payer: Self-pay | Admitting: Obstetrics and Gynecology

## 2021-06-05 NOTE — H&P (Addendum)
Hannah Acosta is a 35 y.o.  female, P: 2-0-0-2 presents for endometrial ablation because of menorrhagia. Patient gives a history of worsening periods following her tubal sterilization in 2020. Since that time her flow has lasted 7 days with the need to change an overnight-heavy flow pad 3 times a day. Additionally she experiences cramping rated 10/10 but will decrease to 6/10 with Ibuprofen 800 mg.  She goes on to report intermenstrual spotting for several days but is mostly a brown discharge.  She denies any vaginitis symptoms, dyspareunia, changes in bowel or bladder function.  A pelvic ultrasound in July 2022 revealed an anteverted uterus: (8.3 cm from fundus to external os) 4.93 x 3.65 x 4.31 cm, endometrium: 4.13 mm; right ovary-3.14 cm and left ovary-2.84 cm.  An endometrial biopsy May 15, 2021 returned benign findings.   The patient has taken  oral contraceptives continuously to decrease the volume of her menses and irregular pattern however,  there has been little change with this regimen.  A review of both medical and surgical management options were given to the patient however, she has decided to proceed with surgical management in the form of endometrial ablation.   Past Medical History  OB History: G: 2; P; 2-0-0-2; C-section: 2017 and 2020  GYN History: menarche : 35 YO;   LMP : 05/13/2021;   Contraception: Tubal Sterilization;  Denies history of abnormal PAP smear .; Last PAP smear: 2022-normal with negative HPV  Medical History: Anemia, Anxiety, Depression, Pregnancy Induced Hypertension, Thyroid Disease (Hashimoto's Thyroiditis), Stress Urinary Incontinence and PMDD.  Surgical History: 2020 Bilateral Salpingectomy Denies problems with anesthesia or history of blood transfusions  Family History: Alcohol Abuse, Anxiety, Hypertension and Breast Cancer  Social History: Married and employed Part-time as Curator;  Denies tobacco use and occasionally uses  alcohol.   Medications: Duloxetine 30 mg daily Sertraline 50 mg daily (is being tapered) Sprintec (generic) daily Meloxicam 7.5 mg daily prn Levothyroxine 175 mcg daily    No Known Allergies  Denies sensitivity to peanuts, shellfish, soy, latex or adhesives.   ROS: Admits to urinary incontinence but denies headache, vision changes, nasal congestion, dysphagia, tinnitus, dizziness, hoarseness, cough,  chest pain, shortness of breath, nausea, vomiting, diarrhea,constipation,  urinary frequency,  dysuria, hematuria, vaginitis symptoms, pelvic pain, swelling of joints,easy bruising,  myalgias, arthralgias, skin rashes, unexplained weight loss and except as is mentioned in the history of present illness, patient's review of systems is otherwise negative.    Physical Exam  Bp: 118/76;  R: 12; P: 81 bpm; Temperature: 98.7 degrees F orally; Weight: 284 lbs.;  Height: 5'5";  BMI: 47.3; O2Sat.: 98% room air  Neck: supple without masses or thyromegaly Lungs: clear to auscultation Heart: regular rate and rhythm Abdomen: soft, non-tender and no organomegaly Pelvic:EGBUS- wnl; vagina-normal rugae; uterus-normal size, cervix without lesions or motion tenderness; adnexae-no tenderness or masses Extremities:  no clubbing, cyanosis or edema   Assesment: Menorrhagia   Disposition:  A discussion was held with patient regarding the indication for her procedure(s) along with the risks, which include but are not limited to: reaction to anesthesia, damage to adjacent organs,(to include perforation),  infection and excessive bleeding. The patient verbalized understanding of these risks and has consented to proceed with Hysteroscopy, Dilatation, Curettage and Endometrial Ablation  at [facility] on June 22, 2021.   CSN# [id number]   [place] J. Lowell Guitar, PA-C  for Dr. Woodroe Mode. Samuel Bouche discussed with patient patient verbalized understanding and consent signed  and witnessed.

## 2021-06-19 ENCOUNTER — Encounter (HOSPITAL_BASED_OUTPATIENT_CLINIC_OR_DEPARTMENT_OTHER): Payer: Self-pay | Admitting: Obstetrics and Gynecology

## 2021-06-19 ENCOUNTER — Other Ambulatory Visit: Payer: Self-pay

## 2021-06-19 LAB — TISSUE TRANSGLUTAMINASE, IGA: Tissue Transglutaminase Ab, IgA: 2

## 2021-06-19 NOTE — Progress Notes (Signed)
Spoke w/ via phone for pre-op interview--- pt Lab needs dos---- urine preg              Lab results------ pt getting labs done 06-20-2021, CBC/ BMP COVID test -----patient states asymptomatic no test needed Arrive at ------- 1200 on 06-22-2021 NPO after MN NO Solid Food.  Clear liquids from MN until--- 1100 Med rec completed Medications to take morning of surgery ----- synthroid, cymbalta Diabetic medication ----- n/a Patient instructed no nail polish to be worn day of surgery Patient instructed to bring photo id and insurance card day of surgery Patient aware to have Driver (ride ) / caregiver for 24 hours after surgery --husband, harley Patient Special Instructions ----- n/a Pre-Op special Istructions ----- n/a Patient verbalized understanding of instructions that were given at this phone interview. Patient denies shortness of breath, chest pain, fever, cough at this phone interview.

## 2021-06-20 ENCOUNTER — Encounter (HOSPITAL_COMMUNITY)
Admission: RE | Admit: 2021-06-20 | Discharge: 2021-06-20 | Disposition: A | Discharge status: Home / Self Care | Payer: Medicaid Other | Source: Ambulatory Visit | Attending: Obstetrics and Gynecology | Admitting: Obstetrics and Gynecology

## 2021-06-20 DIAGNOSIS — Z01818 Encounter for other preprocedural examination: Secondary | ICD-10-CM | POA: Diagnosis not present

## 2021-06-20 LAB — BASIC METABOLIC PANEL
Anion gap: 8 (ref 5–15)
BUN: 14 mg/dL (ref 6–20)
CO2: 25 mmol/L (ref 22–32)
Calcium: 9.2 mg/dL (ref 8.9–10.3)
Chloride: 107 mmol/L (ref 98–111)
Creatinine, Ser: 0.5 mg/dL (ref 0.44–1.00)
GFR, Estimated: >60 mL/min (ref >60)
Glucose, Bld: 104 mg/dL — ABNORMAL HIGH (ref 70–99)
Potassium: 4.2 mmol/L (ref 3.5–5.1)
Sodium: 140 mmol/L (ref 135–145)

## 2021-06-20 LAB — CBC
HCT: 42.3 % (ref 36.0–46.0)
Hemoglobin: 13.7 g/dL (ref 12.0–15.0)
MCH: 28.7 pg (ref 26.0–34.0)
MCHC: 32.4 g/dL (ref 30.0–36.0)
MCV: 88.7 fL (ref 80.0–100.0)
Platelets: 298 10*3/uL (ref 150–400)
RBC: 4.77 MIL/uL (ref 3.87–5.11)
RDW: 13 % (ref 11.5–15.5)
WBC: 7.2 10*3/uL (ref 4.0–10.5)
nRBC: 0 % (ref 0.0–0.2)

## 2021-06-20 NOTE — Progress Notes (Signed)
Bp at labs 158/100, ekg ordered and done per anesthesia, bp recheck 147/93, pt states she is nervous and excited.

## 2021-06-21 NOTE — Anesthesia Preprocedure Evaluation (Addendum)
Anesthesia Evaluation  Patient identified by MRN, date of birth, ID band Patient awake    Reviewed: Allergy & Precautions, NPO status , Patient's Chart, lab work & pertinent test results  Airway Mallampati: III  TM Distance: >3 FB Neck ROM: Full    Dental  (+) Teeth Intact, Chipped, Dental Advisory Given,    Pulmonary neg pulmonary ROS,    Pulmonary exam normal breath sounds clear to auscultation       Cardiovascular hypertension (168/105 in preop, per pt no hx of HTN), Normal cardiovascular exam Rhythm:Regular Rate:Normal     Neuro/Psych PSYCHIATRIC DISORDERS Anxiety Depression negative neurological ROS     GI/Hepatic negative GI ROS, Neg liver ROS,   Endo/Other  Hypothyroidism Morbid obesityBMI 47  Renal/GU negative Renal ROS  Female GU complaint     Musculoskeletal negative musculoskeletal ROS (+)   Abdominal Normal abdominal exam  (+) + obese,   Peds  Hematology negative hematology ROS (+) hct 42.3   Anesthesia Other Findings   Reproductive/Obstetrics negative OB ROS                            Anesthesia Physical Anesthesia Plan  ASA: 3  Anesthesia Plan: General   Post-op Pain Management:    Induction: Intravenous  PONV Risk Score and Plan: 4 or greater and Ondansetron, Dexamethasone, Midazolam, Scopolamine patch - Pre-op and Treatment may vary due to age or medical condition  Airway Management Planned: LMA  Additional Equipment: None  Intra-op Plan:   Post-operative Plan: Extubation in OR  Informed Consent: I have reviewed the patients History and Physical, chart, labs and discussed the procedure including the risks, benefits and alternatives for the proposed anesthesia with the patient or authorized representative who has indicated his/her understanding and acceptance.     Dental advisory given  Plan Discussed with: CRNA  Anesthesia Plan Comments:         Anesthesia Quick Evaluation

## 2021-06-22 ENCOUNTER — Encounter (HOSPITAL_BASED_OUTPATIENT_CLINIC_OR_DEPARTMENT_OTHER): Payer: Self-pay | Admitting: Obstetrics and Gynecology

## 2021-06-22 ENCOUNTER — Ambulatory Visit (HOSPITAL_COMMUNITY)
Admission: RE | Admit: 2021-06-22 | Discharge: 2021-06-22 | Disposition: A | Discharge status: Home / Self Care | Payer: Medicaid Other | Attending: Obstetrics and Gynecology | Admitting: Obstetrics and Gynecology

## 2021-06-22 ENCOUNTER — Ambulatory Visit (HOSPITAL_BASED_OUTPATIENT_CLINIC_OR_DEPARTMENT_OTHER): Payer: Medicaid Other | Admitting: Anesthesiology

## 2021-06-22 ENCOUNTER — Encounter (HOSPITAL_BASED_OUTPATIENT_CLINIC_OR_DEPARTMENT_OTHER)
Admission: RE | Disposition: A | Discharge status: Home / Self Care | Payer: Self-pay | Source: Home / Self Care | Attending: Obstetrics and Gynecology

## 2021-06-22 ENCOUNTER — Other Ambulatory Visit: Payer: Self-pay

## 2021-06-22 DIAGNOSIS — N920 Excessive and frequent menstruation with regular cycle: Secondary | ICD-10-CM | POA: Insufficient documentation

## 2021-06-22 DIAGNOSIS — N840 Polyp of corpus uteri: Secondary | ICD-10-CM | POA: Diagnosis not present

## 2021-06-22 DIAGNOSIS — Z79899 Other long term (current) drug therapy: Secondary | ICD-10-CM | POA: Insufficient documentation

## 2021-06-22 DIAGNOSIS — Z9079 Acquired absence of other genital organ(s): Secondary | ICD-10-CM | POA: Insufficient documentation

## 2021-06-22 HISTORY — DX: Excessive and frequent menstruation with regular cycle: N92.0

## 2021-06-22 HISTORY — PX: DILITATION & CURRETTAGE/HYSTROSCOPY WITH NOVASURE ABLATION: SHX5568

## 2021-06-22 HISTORY — DX: Stress incontinence (female) (male): N39.3

## 2021-06-22 HISTORY — DX: Personal history of other complications of pregnancy, childbirth and the puerperium: Z87.59

## 2021-06-22 HISTORY — DX: Autoimmune thyroiditis: E06.3

## 2021-06-22 HISTORY — DX: Personal history of diseases of the blood and blood-forming organs and certain disorders involving the immune mechanism: Z86.2

## 2021-06-22 LAB — POCT PREGNANCY, URINE: Preg Test, Ur: NEGATIVE

## 2021-06-22 SURGERY — DILATATION & CURETTAGE/HYSTEROSCOPY WITH NOVASURE ABLATION
Anesthesia: General | Site: Uterus

## 2021-06-22 MED ORDER — HYDROMORPHONE HCL 1 MG/ML IJ SOLN: 0.2500 mg | INTRAMUSCULAR | Status: DC | PRN

## 2021-06-22 MED ORDER — OXYCODONE-ACETAMINOPHEN 5-325 MG PO TABS: 1.0000 | ORAL_TABLET | Freq: Four times a day (QID) | ORAL | 0 refills | Status: DC | PRN

## 2021-06-22 MED ORDER — SCOPOLAMINE 1 MG/3DAYS TD PT72
1.0000 | MEDICATED_PATCH | TRANSDERMAL | Status: DC
  Administered 2021-06-22: 1.5 mg via TRANSDERMAL

## 2021-06-22 MED ORDER — LACTATED RINGERS IV SOLN: INTRAVENOUS | Status: DC

## 2021-06-22 MED ORDER — OXYCODONE HCL 5 MG PO TABS: 5.0000 mg | ORAL_TABLET | Freq: Once | ORAL | Status: DC | PRN

## 2021-06-22 MED ORDER — MIDAZOLAM HCL 2 MG/2ML IJ SOLN
INTRAMUSCULAR | Status: DC | PRN
  Administered 2021-06-22: 2 mg via INTRAVENOUS

## 2021-06-22 MED ORDER — GLYCOPYRROLATE 0.2 MG/ML IJ SOLN
INTRAMUSCULAR | Status: DC | PRN
  Administered 2021-06-22 (×2): .1 mg via INTRAVENOUS

## 2021-06-22 MED ORDER — ONDANSETRON HCL 4 MG/2ML IJ SOLN
INTRAMUSCULAR | Status: DC | PRN
  Administered 2021-06-22: 4 mg via INTRAVENOUS

## 2021-06-22 MED ORDER — DEXAMETHASONE SODIUM PHOSPHATE 4 MG/ML IJ SOLN
INTRAMUSCULAR | Status: DC | PRN
  Administered 2021-06-22: 8 mg via INTRAVENOUS

## 2021-06-22 MED ORDER — SCOPOLAMINE 1 MG/3DAYS TD PT72
MEDICATED_PATCH | TRANSDERMAL | Status: AC
  Filled 2021-06-22: qty 1

## 2021-06-22 MED ORDER — FENTANYL CITRATE (PF) 100 MCG/2ML IJ SOLN
INTRAMUSCULAR | Status: DC | PRN
  Administered 2021-06-22: 50 ug via INTRAVENOUS
  Administered 2021-06-22 (×2): 25 ug via INTRAVENOUS
  Administered 2021-06-22: 50 ug via INTRAVENOUS
  Administered 2021-06-22 (×2): 25 ug via INTRAVENOUS

## 2021-06-22 MED ORDER — POVIDONE-IODINE 10 % EX SWAB: 2.0000 "application " | Freq: Once | CUTANEOUS | Status: DC

## 2021-06-22 MED ORDER — AMISULPRIDE (ANTIEMETIC) 5 MG/2ML IV SOLN: 10.0000 mg | Freq: Once | INTRAVENOUS | Status: DC | PRN

## 2021-06-22 MED ORDER — KETOROLAC TROMETHAMINE 30 MG/ML IJ SOLN
INTRAMUSCULAR | Status: DC | PRN
  Administered 2021-06-22: 30 mg via INTRAVENOUS

## 2021-06-22 MED ORDER — KETOROLAC TROMETHAMINE 30 MG/ML IJ SOLN: 30.0000 mg | Freq: Once | INTRAMUSCULAR | Status: DC | PRN

## 2021-06-22 MED ORDER — MIDAZOLAM HCL 2 MG/2ML IJ SOLN
INTRAMUSCULAR | Status: AC
  Filled 2021-06-22: qty 2

## 2021-06-22 MED ORDER — PROMETHAZINE HCL 25 MG/ML IJ SOLN: 6.2500 mg | INTRAMUSCULAR | Status: DC | PRN

## 2021-06-22 MED ORDER — LIDOCAINE HCL (CARDIAC) PF 100 MG/5ML IV SOSY
PREFILLED_SYRINGE | INTRAVENOUS | Status: DC | PRN
  Administered 2021-06-22: 60 mg via INTRAVENOUS

## 2021-06-22 MED ORDER — OXYCODONE HCL 5 MG/5ML PO SOLN: 5.0000 mg | Freq: Once | ORAL | Status: DC | PRN

## 2021-06-22 MED ORDER — ACETAMINOPHEN 500 MG PO TABS
1000.0000 mg | ORAL_TABLET | Freq: Once | ORAL | Status: AC
  Administered 2021-06-22: 1000 mg via ORAL

## 2021-06-22 MED ORDER — PROPOFOL 10 MG/ML IV BOLUS
INTRAVENOUS | Status: AC
  Filled 2021-06-22: qty 20

## 2021-06-22 MED ORDER — PROPOFOL 10 MG/ML IV BOLUS
INTRAVENOUS | Status: DC | PRN
  Administered 2021-06-22: 100 mg via INTRAVENOUS
  Administered 2021-06-22: 200 mg via INTRAVENOUS
  Administered 2021-06-22: 100 mg via INTRAVENOUS
  Administered 2021-06-22: 20 mg via INTRAVENOUS

## 2021-06-22 MED ORDER — FENTANYL CITRATE (PF) 100 MCG/2ML IJ SOLN
INTRAMUSCULAR | Status: AC
  Filled 2021-06-22: qty 2

## 2021-06-22 MED ORDER — LIDOCAINE HCL 1 % IJ SOLN
INTRAMUSCULAR | Status: DC | PRN
  Administered 2021-06-22: 7 mL

## 2021-06-22 MED ORDER — MEPERIDINE HCL 25 MG/ML IJ SOLN: 6.2500 mg | INTRAMUSCULAR | Status: DC | PRN

## 2021-06-22 MED ORDER — ACETAMINOPHEN 500 MG PO TABS
ORAL_TABLET | ORAL | Status: AC
  Filled 2021-06-22: qty 2

## 2021-06-22 MED ORDER — SODIUM CHLORIDE 0.9 % IR SOLN
Status: DC | PRN
  Administered 2021-06-22 (×2): 3000 mL

## 2021-06-22 SURGICAL SUPPLY — 17 items
ABLATOR SURESOUND NOVASURE (ABLATOR) ×2 IMPLANT
CATH ROBINSON RED A/P 16FR (CATHETERS) ×2 IMPLANT
GAUZE 4X4 16PLY ~~LOC~~+RFID DBL (SPONGE) ×4 IMPLANT
GLOVE SURG ENC MOIS LTX SZ7.5 (GLOVE) ×2 IMPLANT
GLOVE SURG UNDER POLY LF SZ7 (GLOVE) ×2 IMPLANT
GLOVE SURG UNDER POLY LF SZ7.5 (GLOVE) ×2 IMPLANT
GOWN STRL REUS W/TWL LRG LVL3 (GOWN DISPOSABLE) ×4 IMPLANT
IV NS IRRIG 3000ML ARTHROMATIC (IV SOLUTION) ×4 IMPLANT
KIT PROCEDURE FLUENT (KITS) ×2 IMPLANT
KIT TURNOVER CYSTO (KITS) ×2 IMPLANT
PACK VAGINAL MINOR WOMEN LF (CUSTOM PROCEDURE TRAY) ×2 IMPLANT
PAD OB MATERNITY 4.3X12.25 (PERSONAL CARE ITEMS) ×2 IMPLANT
PAD PREP 24X48 CUFFED NSTRL (MISCELLANEOUS) ×2 IMPLANT
PANTS MESH DISP 2XL (UNDERPADS AND DIAPERS) ×1 IMPLANT
PANTS MESH DISPOSABLE 2XL (UNDERPADS AND DIAPERS) ×1
SEAL ROD LENS SCOPE MYOSURE (ABLATOR) ×2 IMPLANT
TOWEL OR 17X26 10 PK STRL BLUE (TOWEL DISPOSABLE) ×4 IMPLANT

## 2021-06-22 NOTE — Transfer of Care (Signed)
Immediate Anesthesia Transfer of Care Note  Patient: Hannah Acosta  Procedure(s) Performed: DILATATION & CURETTAGE/HYSTEROSCOPY WITH ATTEMPTED NOVASURE ABLATION (Uterus)  Patient Location: PACU  Anesthesia Type:General  Level of Consciousness: awake, oriented and patient cooperative  Airway & Oxygen Therapy: Patient Spontanous Breathing  Post-op Assessment: Report given to RN and Post -op Vital signs reviewed and stable  Post vital signs: Reviewed and stable  Last Vitals:  Vitals Value Taken Time  BP    Temp    Pulse    Resp 11 06/22/21 1458  SpO2    Vitals shown include unvalidated device data.  Last Pain:  Vitals:   06/22/21 1237  TempSrc: Oral  PainSc: 0-No pain      Patients Stated Pain Goal: 4 (06/22/21 1237)  Complications: No notable events documented.

## 2021-06-22 NOTE — Anesthesia Procedure Notes (Signed)
Procedure Name: LMA Insertion Date/Time: 06/22/2021 1:53 PM Performed by: Earmon Phoenix, CRNA Pre-anesthesia Checklist: Patient identified, Emergency Drugs available, Suction available, Patient being monitored and Timeout performed Patient Re-evaluated:Patient Re-evaluated prior to induction Oxygen Delivery Method: Circle system utilized Preoxygenation: Pre-oxygenation with 100% oxygen Induction Type: IV induction Ventilation: Mask ventilation without difficulty LMA: LMA inserted LMA Size: 4.0 Number of attempts: 1 Placement Confirmation: positive ETCO2, CO2 detector and breath sounds checked- equal and bilateral Tube secured with: Tape Dental Injury: Teeth and Oropharynx as per pre-operative assessment

## 2021-06-22 NOTE — Discharge Instructions (Addendum)
  Post Anesthesia Home Care Instructions  Activity: Get plenty of rest for the remainder of the day. A responsible individual must stay with you for 24 hours following the procedure.  For the next 24 hours, DO NOT: -Drive a car -Advertising copywriter -Drink alcoholic beverages -Take any medication unless instructed by your physician -Make any legal decisions or sign important papers.  Meals: Start with liquid foods such as gelatin or soup. Progress to regular foods as tolerated. Avoid greasy, spicy, heavy foods. If nausea and/or vomiting occur, drink only clear liquids until the nausea and/or vomiting subsides. Call your physician if vomiting continues.  Special Instructions/Symptoms: Your throat may feel dry or sore from the anesthesia or the breathing tube placed in your throat during surgery. If this causes discomfort, gargle with warm salt water. The discomfort should disappear within 24 hours.  If you had a scopolamine patch placed behind your ear for the management of post- operative nausea and/or vomiting:  1. The medication in the patch is effective for 72 hours, after which it should be removed.  Wrap patch in a tissue and discard in the trash. Wash hands thoroughly with soap and water. 2. You may remove the patch earlier than 72 hours if you experience unpleasant side effects which may include dry mouth, dizziness or visual disturbances. 3. Avoid touching the patch. Wash your hands with soap and water after contact with the patch.     DISCHARGE INSTRUCTIONS: HYSTEROSCOPY / ENDOMETRIAL ABLATION The following instructions have been prepared to help you care for yourself upon your return home.  May Remove Scop patch on or before Monday 06/25/2021  May take Ibuprofen after 8:00 p.m.  May take stool softner while taking narcotic pain medication to prevent constipation.  Drink plenty of water.  Personal hygiene:  Use sanitary pads for vaginal drainage, not tampons.  Shower the day  after your procedure.  NO tub baths, pools or Jacuzzis for 2-3 weeks.  Wipe front to back after using the bathroom.  Activity and limitations:  Do NOT drive or operate any equipment for 24 hours. The effects of anesthesia are still present and drowsiness may result.  Do NOT rest in bed all day.  Walking is encouraged.  Walk up and down stairs slowly.  You may resume your normal activity in one to two days or as indicated by your physician. Sexual activity: NO intercourse for at least 2 weeks after the procedure, or as indicated by your Doctor.  Diet: Eat a light meal as desired this evening. You may resume your usual diet tomorrow.  Return to Work: You may resume your work activities in one to two days or as indicated by Therapist, sports.  What to expect after your surgery: Expect to have vaginal bleeding/discharge for 2-3 days and spotting for up to 10 days. It is not unusual to have soreness for up to 1-2 weeks. You may have a slight burning sensation when you urinate for the first day. Mild cramps may continue for a couple of days. You may have a regular period in 2-6 weeks.  Call your doctor for any of the following:  Excessive vaginal bleeding or clotting, saturating and changing one pad every hour.  Inability to urinate 6 hours after discharge from hospital.  Pain not relieved by pain medication.  Fever of 100.4 F or greater.  Unusual vaginal discharge or odor.

## 2021-06-22 NOTE — Op Note (Signed)
Preop Diagnosis: MENORRHAGIA   Postop Diagnosis: MENORRHAGIA   Procedure: 1.HYSTEROSCOPY 2.DILATION & CURETTAGE   Anesthesia: Choice   Anesthesiologist: Lannie Fields, DO   Attending: Osborn Coho, MD   Assistant: N/A  Findings: Uterus sounded to 8.5cm with no intracavitary lesions.  Cervical tissue friable.  Difficulty maintaining tenaculum on cervix.  Fluid mgmt system sounded as if it was running quickly but fluid deficit slowly increasing to 800cc.  Hysteroscope removed and while assessing fluid with fluid no longer running the deficit jumped up to 1585 cc with about 85cc on the floor.  Case was then aborted.  Pathology: Endometrial Curettings  Fluids: 900 cc Hysteroscopic Fluid Deficit 1500cc  UOP: Voided prior to procedure  EBL: 5cc  Procedure: The patient was taken to the operating room after the risks, benefits and alternatives were discussed with the patient. The patient verbalized understanding and consent signed and witnessed. The patient was placed under general anesthesia with an LMA per anesthesiologist and prepped and draped in the normal sterile fashion.  Time Out was performed per protocol.  A bivalve speculum was placed in the patient's vagina and the anterior lip of the cervix was grasped with a single tooth tenaculum. A paracervical block was administered using a total of 10 cc of 1% lidocaine. The uterus sounded to 8.5 cm. The cervix was dilated for passage of the hysteroscope.  It was challenging getting to cervix as vagina was long and large graves was required.  The hysteroscope was introduced into the uterine cavity and findings as noted above. Sharp curettage was performed until a gritty texture was noted and curettings were sent to pathology. Attempt was made to do novasure but cavity assessment failed.  Upon trying to assess failed attempt findings as noted above.  A second attempt was aborted.  Sponge lap and needle count was correct. The patient  tolerated the procedure well and was returned to the recovery room in stable condition.

## 2021-06-22 NOTE — Progress Notes (Signed)
Small clot on tissue paper.

## 2021-06-22 NOTE — Progress Notes (Signed)
Ambulated to the bathroom 

## 2021-06-24 NOTE — Anesthesia Postprocedure Evaluation (Signed)
Anesthesia Post Note  Patient: Hannah Acosta  Procedure(s) Performed: DILATATION & CURETTAGE/HYSTEROSCOPY WITH ATTEMPTED NOVASURE ABLATION (Uterus)     Patient location during evaluation: PACU Anesthesia Type: General Level of consciousness: awake and alert, oriented and patient cooperative Pain management: pain level controlled Vital Signs Assessment: post-procedure vital signs reviewed and stable Respiratory status: spontaneous breathing, nonlabored ventilation and respiratory function stable Cardiovascular status: blood pressure returned to baseline and stable Postop Assessment: no apparent nausea or vomiting Anesthetic complications: no   No notable events documented.  Last Vitals:  Vitals:   06/22/21 1530 06/22/21 1550  BP: (!) 149/90 (!) 149/90  Pulse: 86 90  Resp: 13 15  Temp: 36.6 C   SpO2: 96% 100%    Last Pain:  Vitals:   06/22/21 1550  TempSrc:   PainSc: 0-No pain                 Lannie Fields

## 2021-06-25 ENCOUNTER — Encounter (HOSPITAL_BASED_OUTPATIENT_CLINIC_OR_DEPARTMENT_OTHER): Payer: Self-pay | Admitting: Obstetrics and Gynecology

## 2021-06-25 LAB — SURGICAL PATHOLOGY

## 2021-08-11 IMAGING — DX DG LUMBAR SPINE COMPLETE 4+V
5 series · 5 of 5 positions shown · non-contrast
Comparison: None.

CLINICAL DATA: Acute low back pain.

EXAM:
LUMBAR SPINE - COMPLETE 4+ VIEW

[l-spine ap]
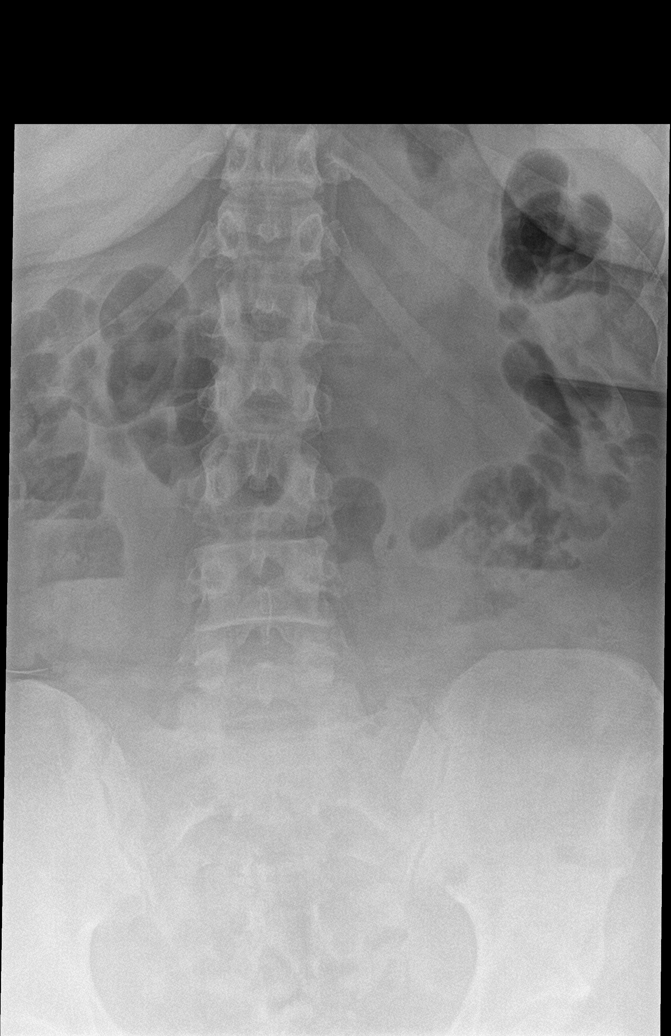

[l-spine obl (1 of 2)]
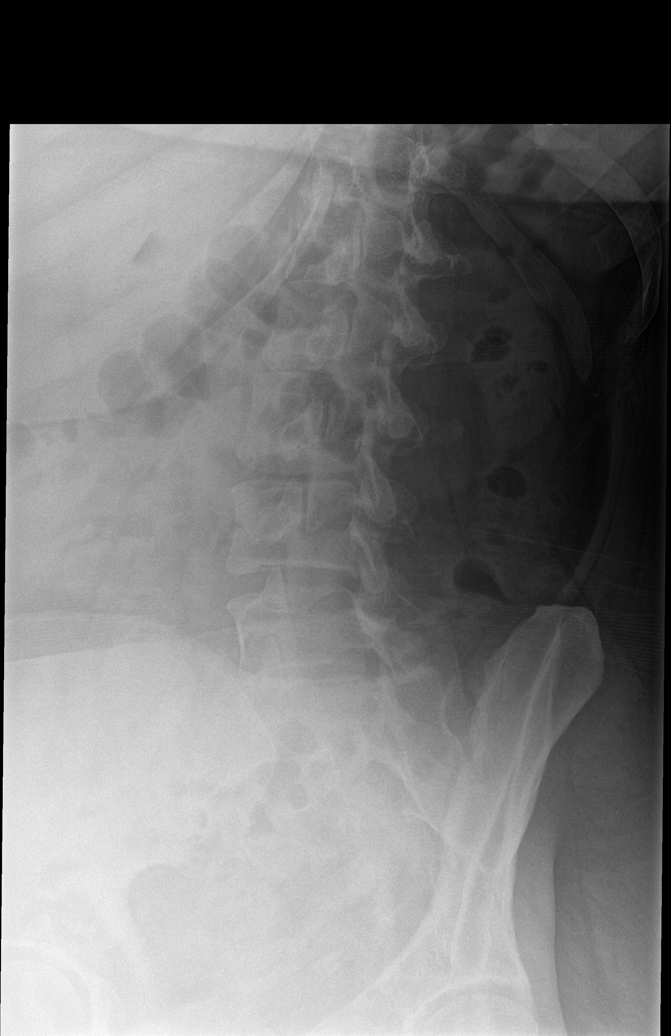

[l-spine obl (2 of 2)]
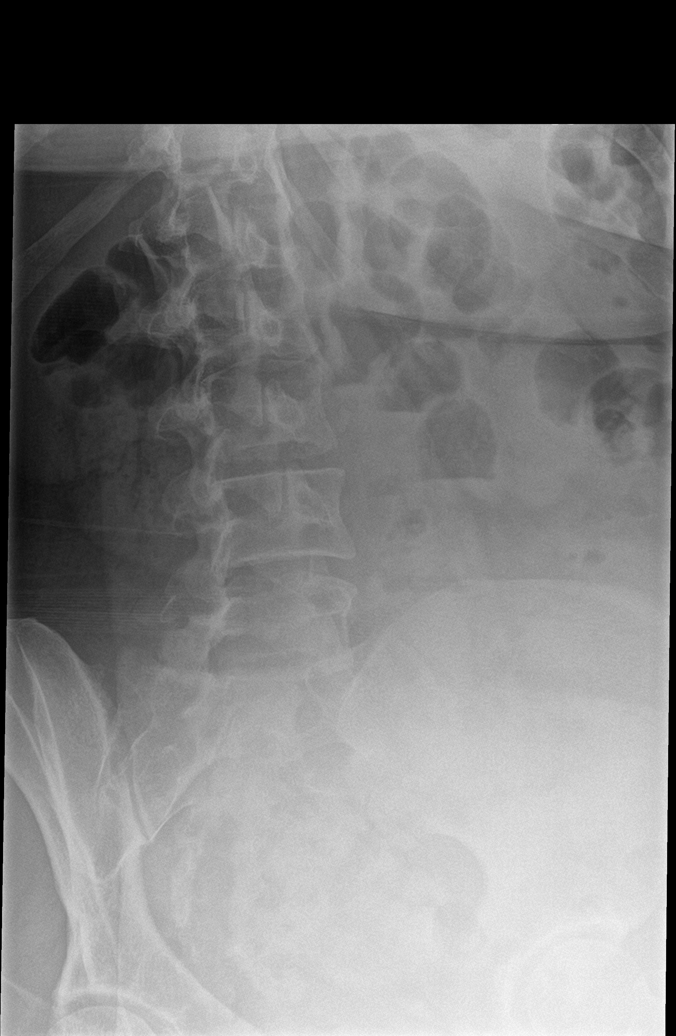

[l-spine lat]
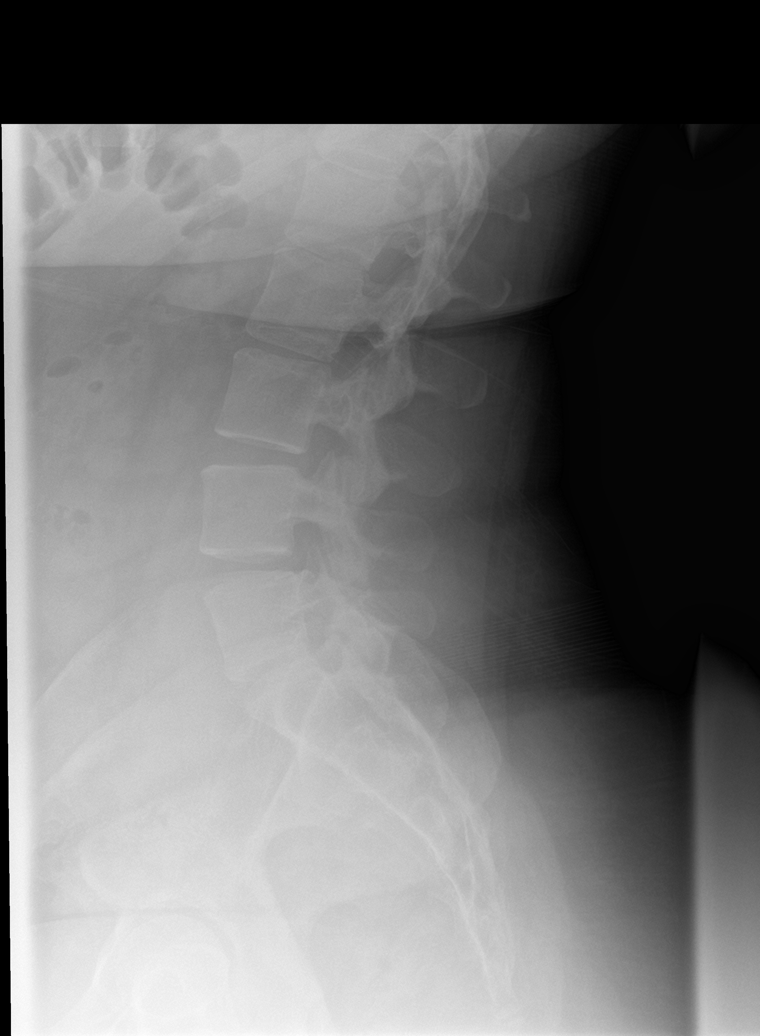

[l-spine spot]
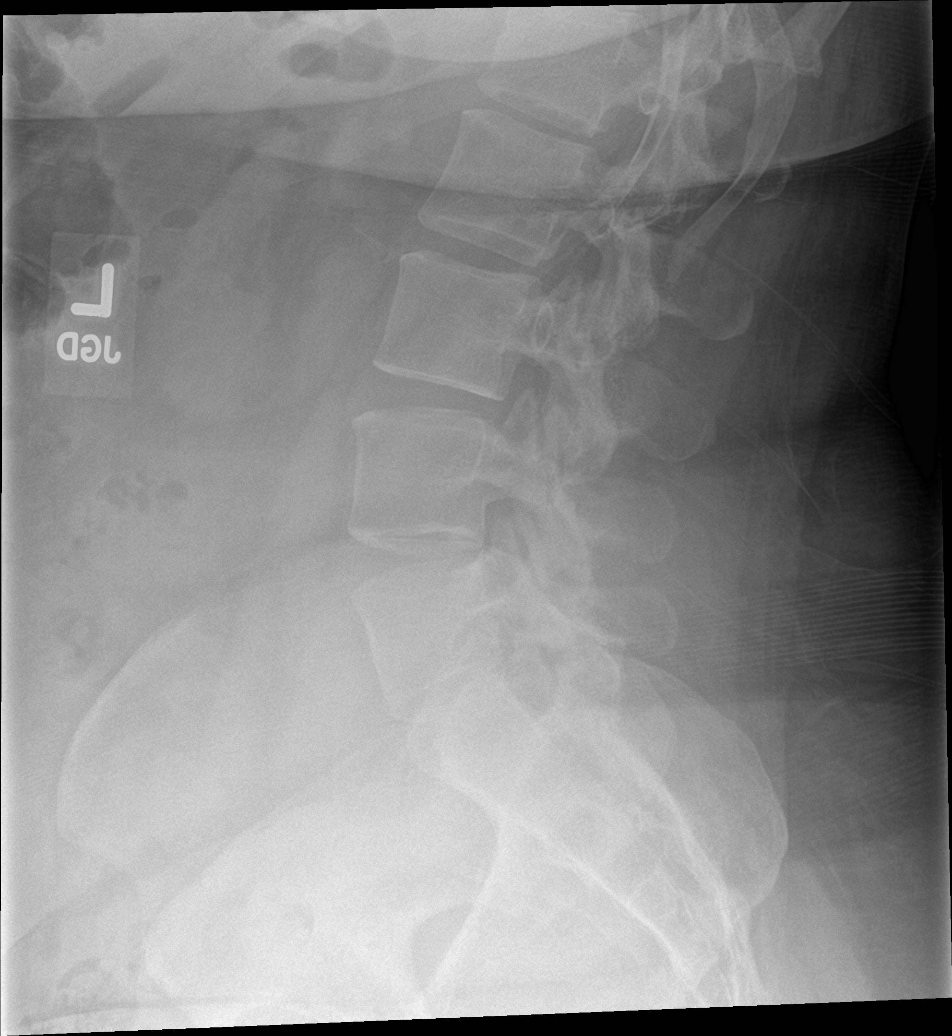

[5 of 5 positions shown; findings below may reference images not displayed]

FINDINGS: There is no evidence of lumbar spine fracture. Alignment is normal.
Intervertebral disc spaces are maintained.
IMPRESSION: Negative.

## 2022-06-11 LAB — HM PAP SMEAR: HM Pap smear: NORMAL

## 2022-07-18 ENCOUNTER — Other Ambulatory Visit: Payer: Self-pay | Admitting: Family Medicine

## 2022-07-18 ENCOUNTER — Ambulatory Visit
Admission: RE | Admit: 2022-07-18 | Discharge: 2022-07-18 | Disposition: A | Discharge status: Home / Self Care | Payer: Medicaid Other | Source: Ambulatory Visit | Attending: Family Medicine | Admitting: Family Medicine

## 2022-07-18 DIAGNOSIS — R0989 Other specified symptoms and signs involving the circulatory and respiratory systems: Secondary | ICD-10-CM

## 2022-07-18 LAB — LIPID PANEL
Cholesterol: 194 (ref 0–200)
HDL: 132 — AB (ref 35–70)
LDL Cholesterol: 116
LDl/HDL Ratio: 3.1
Triglycerides: 88 (ref 40–160)

## 2022-08-12 ENCOUNTER — Encounter: Payer: Self-pay | Admitting: Dietician

## 2022-08-12 ENCOUNTER — Encounter: Payer: Medicaid Other | Attending: Internal Medicine | Admitting: Dietician

## 2022-08-12 VITALS — Ht 65.0 in | Wt 258.4 lb

## 2022-08-12 DIAGNOSIS — Z6841 Body Mass Index (BMI) 40.0 and over, adult: Secondary | ICD-10-CM | POA: Diagnosis present

## 2022-08-12 NOTE — Progress Notes (Signed)
Medical Nutrition Therapy  Appointment Start time:  [phone]  Appointment End time:  98  Primary concerns today: Pt states she has done weight loss programs in the past and wants something that will be sustainable. Pt is considering Wegovy and wants to try lifestyle changes before trying [facility].    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 Medications: levothyroxine Labs: A1c 5.8% 06/2022 Notable Signs/Symptoms: none Food Allergies: none  Lifestyle & Dietary Hx  Pt lives with husband and 2 kids, 53 and 30 years old. Pt does most of the shopping and cooking. Pt's husband will pack her lunch on the days she works.    Pt did [facility] and lost 50lbs and gained 30lbs back. She has also done the [facility] weight loss program. Pt states these did not seem sustainable and she would eventually get tired of the food or program and go back to how she ate before.   Pt states she eats dinner at 5:45pm and then she gets hungry at 11pm and over indulges in less nutritious snacks right before bed and states this helps her sleep better.   Takes thyroid medication at 5am. Pt can not eat for 1 hour after.   Pt states she only drinks around 8-10 oz of water daily. Pt drinks 30 oz coffee and 3-4 ginger ales per day.  Pt states her and her husband go walking twice per week. Pt plans to start walking more. Pt states she enjoyed swimming in the past but would need to find another aquatic center or pool.  Pt reports she does not snack much during the day but snacks heavily at night before bed.   Estimated daily fluid intake: 10 oz plain water Supplements: none Sleep: 5-6 hours Stress / self-care: high stress Current average weekly physical activity: walks 20 minutes  2x/wk  24-Hr Dietary Recall First Meal: 10am breakfast burrito on tortilla w/ eggs whites, veggies and cheese.  Snack: none Second Meal: leftovers: rice, broccoli, and pork tenderloin. OR [employer] sandwich with meat and cheese and dried pineapple and praline pecans Snack: none OR grazes Third Meal: 6pm: meat and two sides (starch and vegetable) OR meatloaf with mashed potatoes and broccoli OR frozen pizza OR grilled cheese and chicken noodle soup Snack: 10/11pm: ice cream OR candy OR graham crackers Beverages: 30oz coffee with sugar/erythritol blend and whole milk, 3-4 gingerale daily, water.    NUTRITION DIAGNOSIS  NB-1.1 Food and nutrition-related knowledge deficit As related to prediabetes.  As evidenced by pt report and diet history.   NUTRITION INTERVENTION  Nutrition education (E-1) on the following topics:  Building balanced meals and snacks Whole vs refined grains effects on blood glucose Prediabetes A1c meaning, goals, and ranges Insulin resistance Impact of physical activity on blood glucose Effects of long-term yo-yo dieting Reduction of added sugars and sugar sweetened beverages Importance of adequate hydration on health and blood glucose Importance of eating consistently  Handouts Provided Include  Dish Up a Healthy Meal Balanced Plate Food Groups AHA How to Get Healthy Sleep Snack Suggestions  Learning Style & Readiness for Change Teaching method utilized: Visual & Auditory  Demonstrated degree of understanding via: Teach Back  Barriers to learning/adherence to lifestyle change: none  Goals Established by Pt Aim for 150 minutes of physical activity weekly.  Eat more Non-Starchy Vegetables -  Aim to make 1/2 of your plate non-starchy vegetables at least twice per day.   Minimize added sugars and refined grains.  Increase water intake. Rethink what you drink. Choose beverages without added sugar. Look for 0 carbs on the label.  Choose whole foods over  processed.  Choose 2% over whole milk.   Make simple meals at home more often than eating out.  Aim to eat within 1-2 hours of waking up and every 3-5 hours following.   Have a balanced snack 2 hours before going to bed.    MONITORING & EVALUATION Dietary intake, weekly physical activity, and follow up in 2-3 months.  Next Steps  Patient is to call for questions.

## 2022-08-12 NOTE — Patient Instructions (Addendum)
Aim for 150 minutes of physical activity weekly. -Make a plan! -15 minutes after a meal can help with blood sugar control.  Eat more Non-Starchy Vegetables - Aim to make 1/2 of your plate non-starchy vegetables at least twice per day.   Minimize added sugars and refined grains.  Increase water intake. Rethink what you drink. Choose beverages without added sugar. Look for 0 carbs on the label. Tips for increasing water intake: -Keep a glass of water by your bedside so that you're encouraged to drink it upon waking -Carry a re-usable, re-fillable water bottle -Add fruit such as lemon, lime, berries, or cucumbers to your water -Try sparkling water -Make herbal tea and drink it hot or iced  Choose whole foods over processed.  Choose 2% over whole milk.   Make simple meals at home more often than eating out.  Aim to eat within 1-2 hours of waking up and every 3-5 hours following.   Have a balanced snack 2 hours before going to bed.

## 2022-10-14 ENCOUNTER — Encounter: Payer: Self-pay | Admitting: Dietician

## 2022-10-14 ENCOUNTER — Encounter: Payer: Medicaid Other | Attending: Internal Medicine | Admitting: Dietician

## 2022-10-14 VITALS — Ht 65.0 in | Wt 266.0 lb

## 2022-10-14 DIAGNOSIS — Z6841 Body Mass Index (BMI) 40.0 and over, adult: Secondary | ICD-10-CM | POA: Diagnosis present

## 2022-10-14 NOTE — Progress Notes (Signed)
Medical Nutrition Therapy  Appointment Start time:  0900  Appointment End time:  [phone]  Primary concerns today: Pt states she has done weight loss programs in the past and wants something that will be sustainable. Pt is considering Wegovy and wants to try lifestyle changes before trying [facility].    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 Medications: levothyroxine Labs: A1c 5.8% 06/2022 Notable Signs/Symptoms: none Food Allergies: none  Lifestyle & Dietary Hx  Pt has been having a healthy snack at 9pm. Upon last visit pt was waiting until 11pm when she was very hungry and would then snack on whatever was available.  Pt has switched from 3-4 ginger ales daily to 3-4 bottles of sparkling ice sparkling water.   Pt reports her stress is still high and she feels that she is unable to have much time to herself.   Pt continues to work with therapist and psychiatrist for mental health.   Pt reports her mom bought her 3 months of "go-lo" supplement but she has not taken it.   Estimated daily fluid intake: 10 oz plain water Supplements: none Sleep: 5-6 hours Stress / self-care: high stress Current average weekly physical activity: ADLs  24-Hr Dietary Recall First Meal: breakfast burrito Snack: none Second Meal: leftovers (meat starch vegetable) Snack:  Third Meal: 6pm: Pork tenderloin, salad, brown rice.  Snack: 9pm: cheese and crackers Beverages: 30oz coffee with sugar/erythritol blend and whole milk, 3 sparkling ice water per day   NUTRITION DIAGNOSIS  NB-1.1 Food and nutrition-related knowledge deficit As related to prediabetes.  As evidenced by pt report and diet history.   NUTRITION INTERVENTION  Nutrition education (E-1) on the following  topics:  Benefits of physical activity Impact of stress on blood glucose Coping strategies for stress management Whole vs refined grains  Handouts Provided Include  No handouts provided at this follow up  [place] for Change Teaching method utilized: Visual & Auditory  Demonstrated degree of understanding via: Teach Back  Barriers to learning/adherence to lifestyle change: none  Goals Established by Pt Goal: walk outside for 20 minutes two times per week.   Regular physical activity promotes overall health-including helping to reduce risk for heart disease and diabetes, promoting mental health, and helping [place] sleep better.     Consider asking your doctor about getting an updated A1C.   Switch regular pasta to whole grain pasta.    MONITORING & EVALUATION Dietary intake, weekly physical activity, and follow up in 2-3 months.  Next Steps  Patient is to call for questions.

## 2022-10-14 NOTE — Patient Instructions (Addendum)
Goal: walk outside for 20 minutes two times per week.   Regular physical activity promotes overall health-including helping to reduce risk for heart disease and diabetes, promoting mental health, and helping [place] sleep better.     Consider asking your doctor about getting an updated A1C.   Switch regular pasta to whole grain pasta.

## 2023-01-16 ENCOUNTER — Ambulatory Visit: Payer: Medicaid Other | Admitting: Dietician

## 2023-04-03 LAB — HEMOGLOBIN A1C: Hemoglobin A1C: 6.1

## 2023-04-03 LAB — TSH: TSH: 1.17 (ref 0.41–5.90)

## 2023-04-03 LAB — CK: CK Total: 134

## 2023-05-27 ENCOUNTER — Telehealth: Payer: Self-pay | Admitting: Family Medicine

## 2023-05-27 NOTE — Telephone Encounter (Signed)
Patient called in and stated that her husband Hannah Acosta sees DR. Para March. She was wanting to know if Dr. Para March would take her on as a new patient. Please advise. Thank you!

## 2023-05-28 NOTE — Telephone Encounter (Signed)
Patient scheduled.

## 2023-05-28 NOTE — Telephone Encounter (Signed)
Yes, please schedule when possible.  Thanks.

## 2023-08-27 LAB — CBC AND DIFFERENTIAL: Hemoglobin: 14.6 (ref 12.0–16.0)

## 2023-08-27 LAB — HEPATIC FUNCTION PANEL
ALT: 23 U/L (ref 7–35)
AST: 17 (ref 13–35)

## 2023-08-27 LAB — BASIC METABOLIC PANEL
Creatinine: 0.7 (ref 0.5–1.1)
Glucose: 88

## 2023-09-08 ENCOUNTER — Encounter: Payer: Self-pay | Admitting: Family Medicine

## 2023-09-08 ENCOUNTER — Ambulatory Visit (INDEPENDENT_AMBULATORY_CARE_PROVIDER_SITE_OTHER): Payer: Medicaid Other | Admitting: Family Medicine

## 2023-09-08 VITALS — BP 142/74 | HR 81 | Temp 98.6°F | Ht 65.0 in | Wt 286.6 lb

## 2023-09-08 DIAGNOSIS — E039 Hypothyroidism, unspecified: Secondary | ICD-10-CM

## 2023-09-08 DIAGNOSIS — M2550 Pain in unspecified joint: Secondary | ICD-10-CM

## 2023-09-08 DIAGNOSIS — R21 Rash and other nonspecific skin eruption: Secondary | ICD-10-CM

## 2023-09-08 DIAGNOSIS — Z7189 Other specified counseling: Secondary | ICD-10-CM | POA: Diagnosis not present

## 2023-09-08 DIAGNOSIS — F32A Depression, unspecified: Secondary | ICD-10-CM

## 2023-09-08 MED ORDER — NYSTATIN 100000 UNIT/GM EX POWD: 1.0000 | Freq: Two times a day (BID) | CUTANEOUS | 1 refills | Status: AC

## 2023-09-08 NOTE — Patient Instructions (Addendum)
Please ask the front about getting a record release from Dr. Sharl Ma at endocrinology, Dr. Su Hilt at [facility], and records from [facility].    Change to nystatin powder and let me know if that isn't helping.   Take care.  Glad to see you.

## 2023-09-08 NOTE — Progress Notes (Unsigned)
New patient.  Fatigue, joint pain, mood swings.  On cymbalta 60mg  AM and 30mg  PM, recent increase.  She was going to get DNA testing done through an outside agency.  "I get overwhelmed and overstimulated."  Has IUD.  Still has menses but less flow with IUD. She has mood changes with menses.  Prev hospitalization in 2020 for mood.  No SI/HI currently but mood is low. She is going to start with a new therapist next week.  Cymbalta increase helped some but still with residual sx.    She previously took sertraline and to help with anxiety but she had significant joint pain on the medication.  Wellbutrin did not help her mood.  Joint pain in the hips.  Some sciatica pain.  Some occ wrist and finger pain.  Tends to have more joint pain after URI-discussed that is possible/reported.  Rash on medial L breast for months.  Ketoconazole didn't help.    D/w pt about weight loss options.  We agreed to defer this until we were able to address other issues as above.  Sees [facility], Dr. Osborn Coho.    History of hypothyroidism followed by outside clinic.  Alternating 175 versus 150 mcg levothyroxine daily.  Advanced directive discussed with patient.  Husband designated if patient were incapacitated.  Meds, vitals, and allergies reviewed.   ROS: Per HPI unless specifically indicated in ROS section   GEN: nad, alert and oriented, tearful but regains composure.  No tremor.  Judgment intact.  Speech normal. HEENT: mucous membranes moist NECK: supple w/o LA CV: rrr.  PULM: ctab, no inc wob ABD: soft, +bs EXT: no edema SKIN: Chaperoned exam with superficial fungal changes on the inferior side of the medial left breast. No chronic IP joint changes or erythema.

## 2023-09-10 DIAGNOSIS — M2550 Pain in unspecified joint: Secondary | ICD-10-CM | POA: Insufficient documentation

## 2023-09-10 DIAGNOSIS — R21 Rash and other nonspecific skin eruption: Secondary | ICD-10-CM | POA: Insufficient documentation

## 2023-09-10 DIAGNOSIS — Z7189 Other specified counseling: Secondary | ICD-10-CM | POA: Insufficient documentation

## 2023-09-10 NOTE — Assessment & Plan Note (Signed)
Advanced directive discussed with patient.  Husband designated if patient were incapacitated.  

## 2023-09-10 NOTE — Assessment & Plan Note (Signed)
Alternating 175 versus 150 mcg levothyroxine daily.  Continue as is and request records from endocrinology.

## 2023-09-10 NOTE — Assessment & Plan Note (Signed)
She just recently increased her dose of Cymbalta.  I think it makes sense to continue with that for now and request her outside records and labs.  We can get her previous workup assembled and then go from there.  We may need to get x-rays done to evaluate her if she has persistent joint pain.  I think it makes sense to defer that for now.

## 2023-09-10 NOTE — Assessment & Plan Note (Signed)
Looks like a superficial fungal infection.  Changed to nystatin and update me as needed.

## 2023-09-10 NOTE — Assessment & Plan Note (Signed)
I would continue as is with the higher dose of Cymbalta.  She did not tolerate Wellbutrin or sertraline previously.  She is going to get established with a new therapist.  I think we should get her old records for review and she should continue as planned with her current medication/therapy.  At this point still okay for outpatient follow-up.  She agrees to plan.

## 2023-09-22 ENCOUNTER — Encounter: Payer: Self-pay | Admitting: Family Medicine

## 2023-09-28 ENCOUNTER — Telehealth: Payer: Self-pay | Admitting: Family Medicine

## 2023-09-28 DIAGNOSIS — M25539 Pain in unspecified wrist: Secondary | ICD-10-CM

## 2023-09-28 DIAGNOSIS — M2550 Pain in unspecified joint: Secondary | ICD-10-CM

## 2023-09-28 NOTE — Telephone Encounter (Signed)
Please check with patient.  I was able to review the available outside records.  She had unremarkable sugar kidney liver/blood count testing this year.  Her thyroid and CK levels were both normal.  That is reassuring.  I see that she has a history of a positive ANA.  It looks like she was going to have labs done through endocrinology to check her sed rate and rheumatoid factor.  I do not see those results yet.  If she had not had those labs done yet, I would like to get those collected along with a repeat ANA and a vitamin D level.  I saw that she had a previous low vitamin D level and I do not know if that contributed to her recent symptoms.  Please see how she is feeling at this point and get an update on her joint pain.  I think it likely makes sense to get 1 set of plain films where she is having the most amount of pain (ie wrist, hand, etc.).   Let me know and I can put in the order.  How is her mood in the meantime?  How did it go with her new therapist?  I think it makes sense to get the follow-up labs and x-ray done and then go from there.  Thanks.

## 2023-09-29 NOTE — Telephone Encounter (Signed)
Left voicemail for patient to return call to office. 

## 2023-10-02 NOTE — Telephone Encounter (Signed)
Patient called in returning a call she received. Relayed message below to patient. She stated that she is fine with getting those labs done again. She would like to come here and have them done as well as the follow up x-ray. Informed her that x-ray order wasn't in yet but once we get it we can get her scheduled.   She stated that her joint pain is okay right, but the pain is worse during her menstrual cycle. She stated that she becomes very irritable.  She is ok with having a plain set of films done, but wanted to know what kind he is referring to. She stated that her mood is better and they just brought a dog so that has helped with bringing her some happiness. With therapy she has only had one initial appointment and she haven't had a chance to dive in deep just yet but the first session went well. She stated that her husband brought her a sequence DNA kit for Christmas and she will get those results sent over here as well when they come in.

## 2023-10-02 NOTE — Telephone Encounter (Signed)
Left message to return call to our office.  

## 2023-10-03 NOTE — Telephone Encounter (Signed)
Thanks for the update.  The lab orders are in.  I need to know which joint is the most painful when she is having a flare (or which joint is reliably symptomatic during a flare).  Let me know and I'll put in the xray order.  She'll need a lab/xray visit.  Thanks.

## 2023-10-06 NOTE — Telephone Encounter (Signed)
Left message to return call to our office.  

## 2023-10-07 NOTE — Telephone Encounter (Signed)
Reached out to patient her most symptomatic areas are bilateral hips ankle and wrist. Patient states all three areas on both sides are about the same. I have set up for lab/xray app on 1/7.

## 2023-10-07 NOTE — Addendum Note (Signed)
 Addended by: Joaquim Nam on: 10/07/2023 09:34 PM   Modules accepted: Orders

## 2023-10-07 NOTE — Telephone Encounter (Signed)
 I put in the order for wrist xray series.  Thanks.

## 2023-10-14 ENCOUNTER — Other Ambulatory Visit (INDEPENDENT_AMBULATORY_CARE_PROVIDER_SITE_OTHER): Payer: Medicaid Other

## 2023-10-14 ENCOUNTER — Other Ambulatory Visit: Payer: Self-pay

## 2023-10-14 ENCOUNTER — Ambulatory Visit
Admission: RE | Admit: 2023-10-14 | Discharge: 2023-10-14 | Disposition: A | Discharge status: Home / Self Care | Payer: Medicaid Other | Source: Ambulatory Visit | Attending: Family Medicine | Admitting: Family Medicine

## 2023-10-14 DIAGNOSIS — M2550 Pain in unspecified joint: Secondary | ICD-10-CM

## 2023-10-14 DIAGNOSIS — M25531 Pain in right wrist: Secondary | ICD-10-CM

## 2023-10-14 DIAGNOSIS — M25539 Pain in unspecified wrist: Secondary | ICD-10-CM

## 2023-10-14 LAB — SEDIMENTATION RATE: Sed Rate: 22 mm/h — ABNORMAL HIGH (ref 0–20)

## 2023-10-14 LAB — VITAMIN D 25 HYDROXY (VIT D DEFICIENCY, FRACTURES): VITD: 22.48 ng/mL — ABNORMAL LOW (ref 30.00–100.00)

## 2023-10-15 LAB — RHEUMATOID FACTOR: Rheumatoid fact SerPl-aCnc: <10 [IU]/mL (ref <14)

## 2023-10-15 LAB — ANA W/REFLEX: Anti Nuclear Antibody (ANA): NEGATIVE

## 2023-10-19 ENCOUNTER — Other Ambulatory Visit: Payer: Self-pay | Admitting: Family Medicine

## 2023-10-19 DIAGNOSIS — E559 Vitamin D deficiency, unspecified: Secondary | ICD-10-CM

## 2023-10-19 MED ORDER — BUSPIRONE HCL 5 MG PO TABS: 5.0000 mg | ORAL_TABLET | Freq: Two times a day (BID) | ORAL | Status: DC

## 2023-10-19 MED ORDER — VITAMIN D3 50 MCG (2000 UT) PO CAPS: 2000.0000 [IU] | ORAL_CAPSULE | Freq: Every day | ORAL | Status: AC

## 2023-10-21 ENCOUNTER — Encounter: Payer: Self-pay | Admitting: Family Medicine

## 2023-10-26 ENCOUNTER — Other Ambulatory Visit: Payer: Self-pay | Admitting: Family Medicine

## 2023-10-26 DIAGNOSIS — E559 Vitamin D deficiency, unspecified: Secondary | ICD-10-CM

## 2023-11-26 ENCOUNTER — Encounter: Payer: Medicaid Other | Admitting: Internal Medicine

## 2023-12-31 ENCOUNTER — Encounter: Payer: Self-pay | Admitting: Family Medicine

## 2023-12-31 MED ORDER — BUSPIRONE HCL 5 MG PO TABS: 5.0000 mg | ORAL_TABLET | Freq: Two times a day (BID) | ORAL | 0 refills | Status: DC

## 2024-01-16 ENCOUNTER — Ambulatory Visit: Payer: Medicaid Other | Attending: Internal Medicine | Admitting: Internal Medicine

## 2024-01-16 ENCOUNTER — Encounter: Payer: Self-pay | Admitting: Internal Medicine

## 2024-01-16 VITALS — BP 139/104 | HR 84 | Resp 14 | Ht 65.5 in | Wt 294.0 lb

## 2024-01-16 DIAGNOSIS — M25559 Pain in unspecified hip: Secondary | ICD-10-CM | POA: Diagnosis not present

## 2024-01-16 DIAGNOSIS — M722 Plantar fascial fibromatosis: Secondary | ICD-10-CM | POA: Diagnosis not present

## 2024-01-16 DIAGNOSIS — R768 Other specified abnormal immunological findings in serum: Secondary | ICD-10-CM

## 2024-01-16 NOTE — Progress Notes (Signed)
 Office Visit Note  Patient: Hannah Acosta             Date of Birth: 10-20-1985           MRN: [id number]             PCP: Joaquim Nam, MD Referring: Jackelyn Poling, DO Visit Date: 01/16/2024 Occupation: [employer], previous vet office  Subjective:  New Patient (Initial Visit) (Patient states when she has pain, its usually like an aftershock of being sick. Patient states shell have a wave of pain after she has been sick. Patient states she has had pain all over but it is not chronic. Patient states during her menstrual cycles her pain and inflammation can get worse as well. )    Discussed the use of AI scribe software for clinical note transcription with the patient, who gave verbal consent to proceed.  History of Present Illness   Hannah Acosta "Marchelle Folks" is a 38 year old female with Hashimoto's thyroiditis who presents with joint and muscle problems. She was referred by Dr. Vincente Poli for evaluation of abnormal lab tests and joint and muscle problems.  She has been experiencing joint and muscle problems since 2019 or 2020, typically following an illness. Symptoms appear one to two weeks post-illness and last about one to two weeks, occurring approximately five to six times a year. These episodes are characterized by extreme fatigue, pain, and inflammation, which she describes as an 'aftershock' following an 'earthquake'.  Pain is primarily in the finger joints, knuckles, wrists, feet, and hips, severe enough to interfere with sleep. She uses anti-inflammatories such as ibuprofen and Tylenol, though they are not always effective. There is no visible discoloration or swelling, and the pain is accompanied by stiffness but not numbness.  She has a history of Hashimoto's thyroiditis, diagnosed after her second pregnancy, and has been on Synthroid since 2020. Despite taking vitamin D3 supplements for low vitamin D levels, she continues to feel fatigued and describes herself as being in a 'complete  brain fog'.  She experiences symptoms of premenstrual dysphoric disorder (PMDD), including extreme mood swings, depression, fatigue, and brain fog, which she is currently experiencing.  Her family history includes a grandmother with low thyroid function, though not autoimmune, and a grandfather with Parkinson's disease. There is no known family history of autoimmune diseases such as rheumatoid arthritis or lupus.  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 She describes her lifestyle as active, constantly on the go with her young daughters.      Labs reviewed 10/2023 ANA neg RF neg ESR 22 Vit D 22.48   Activities of Daily Living:  Patient reports morning stiffness for 30 minutes.   Patient Reports nocturnal pain.  Difficulty dressing/grooming: Denies Difficulty climbing stairs: Denies Difficulty getting out of chair: Denies Difficulty using hands for taps, buttons, cutlery, and/or writing: Reports  Review of Systems  Constitutional:  Positive for fatigue.  HENT:  Positive for mouth sores. Negative for mouth dryness.   Eyes:  Negative for dryness.  Respiratory:  Negative for shortness of breath.   Cardiovascular:  Negative for chest pain and palpitations.  Gastrointestinal:  Negative for blood in stool, constipation and diarrhea.  Endocrine: Negative for increased urination.  Genitourinary:  Positive for involuntary urination.  Musculoskeletal:  Positive for joint pain, joint pain, myalgias, morning stiffness and myalgias. Negative for gait problem, joint swelling, muscle weakness and muscle tenderness.  Skin:  Positive for  rash and sensitivity to sunlight. Negative for color change and hair loss.  Allergic/Immunologic: Positive for susceptible to infections.  Neurological:  Positive for dizziness and headaches.  Hematological:  Negative for swollen glands.  Psychiatric/Behavioral:  Positive for depressed mood and sleep  disturbance. The patient is nervous/anxious.     PMFS History:  Patient Active Problem List   Diagnosis Date Noted   Positive ANA (antinuclear antibody) 01/16/2024   Plantar fasciitis 01/16/2024   Lateral pain of hip 01/16/2024   Rash 09/10/2023   Advance care planning 09/10/2023   Joint pain 09/10/2023   Hypothyroidism 08/31/2019   MDD (major depressive disorder), severe (HCC) 08/31/2019   Depression 03/05/2019   Binge eating disorder 12/12/2015   Anxiety 05/11/2015    Past Medical History:  Diagnosis Date   Anxiety    Depression    Hashimoto's disease    endocrinologist--- dr Sharl Ma   History of anemia    during pregnancy   History of COVID-19 10/2019   per pt mild symptoms that resolved   History of pregnancy induced hypertension    History of respiratory syncytial virus (RSV) infection 09/2020   Menorrhagia    PMDD (premenstrual dysphoric disorder)    SUI (stress urinary incontinence, female)     Family History  Problem Relation Age of Onset   Alcohol abuse Mother    Anxiety disorder Mother    Hypertension Mother    Alcohol abuse Father    Cancer Maternal Grandmother    Anxiety disorder Maternal Grandmother    Hypertension Maternal Grandmother    Anxiety disorder Sister    Early death Maternal Uncle    Past Surgical History:  Procedure Laterality Date   CESAREAN SECTION N/A 08/18/2016   Procedure: CESAREAN SECTION;  Surgeon: Genia Del, MD;  [facility]: [facility];  Service: Obstetrics;  Laterality: N/A;   CESAREAN SECTION N/A 03/07/2019   Procedure: CESAREAN SECTION;  Surgeon: Osborn Coho, MD;  Location: [facility];  Service: Obstetrics;  Laterality: N/A;   DILITATION & CURRETTAGE/HYSTROSCOPY WITH NOVASURE ABLATION N/A 06/22/2021   Procedure: DILATATION & CURETTAGE/HYSTEROSCOPY WITH ATTEMPTED NOVASURE ABLATION;  Surgeon: Osborn Coho, MD;  Location: [facility];  Service: Gynecology;  Laterality: N/A;   WISDOM TOOTH EXTRACTION      Social History   Social History Narrative   Married 2014   2 kids.     From [place]   Immunization History  Administered Date(s) Administered   Influenza, Seasonal, Injecte, Preservative Fre 07/02/2016   Pfizer(Comirnaty)Fall Seasonal Vaccine 12 years and older 01/21/2020, 02/15/2020   Tdap 12/31/2018     Objective: Vital Signs: BP (!) 139/104 (BP Location: Right Arm, Patient Position: Sitting, Cuff Size: Large)   Pulse 84   Resp 14   Ht 5' 5.5" (1.664 m)   Wt 294 lb (133.4 kg)   BMI 48.18 kg/m    Physical Exam Constitutional:      Appearance: She is obese.  Eyes:     Conjunctiva/sclera: Conjunctivae normal.  Cardiovascular:     Rate and Rhythm: Normal rate and regular rhythm.  Pulmonary:     Effort: Pulmonary effort is normal.     Breath sounds: Normal breath sounds.  Lymphadenopathy:     Cervical: No cervical adenopathy.  Skin:    General: Skin is warm and dry.     Comments: Diffuse blanching erythema face, upper chest, arms No digital pitting or nail changes  Neurological:     Mental Status: She is alert.  Psychiatric:  Mood and Affect: Mood normal.      Musculoskeletal Exam:  Shoulders full ROM no tenderness or swelling Elbows full ROM no tenderness or swelling Wrists full ROM no tenderness or swelling Fingers full ROM no tenderness or swelling Lateral hip pain provoked with internal rotation, mild tenderness to pressure Knees full ROM no tenderness or swelling Ankles full ROM no tenderness or swelling   Investigation: No additional findings.  Imaging: No results found.  Recent Labs: Lab Results  Component Value Date   WBC 7.2 06/20/2021   HGB 14.6 08/27/2023   PLT 298 06/20/2021   NA 140 06/20/2021   K 4.2 06/20/2021   CL 107 06/20/2021   CO2 25 06/20/2021   GLUCOSE 104 (H) 06/20/2021   BUN 14 06/20/2021   CREATININE 0.7 08/27/2023   BILITOT 0.7 08/30/2019   ALKPHOS 76 08/30/2019   AST 17 08/27/2023   ALT 23 08/27/2023    PROT 8.4 (H) 08/30/2019   ALBUMIN 5.2 (H) 08/30/2019   CALCIUM 9.2 06/20/2021   GFRAA >60 08/30/2019    Speciality Comments: No specialty comments available.  Procedures:  No procedures performed Allergies: Sertraline and Wellbutrin [bupropion]   Assessment / Plan:     Visit Diagnoses: Positive ANA (antinuclear antibody) Recurrent joint pain and fatigue Episodic joint pain and fatigue post-illness, affecting knuckles, wrists, feet, and hips. Positive ANA test suggests possible autoimmune etiology such as lupus or rheumatoid arthritis but has also had negative on repeat testing. Possibly related to Dx of hashimoto's so will also confirm TPO Ab positive. - Order AVISE CTD panel to investigate potential autoimmune conditions.  Hashimoto's thyroiditis Diagnosed in 2020, on Synthroid. Thyroid function tests normal. Current symptoms not due to thyroid dysfunction. - Continue Synthroid for thyroid hormone replacement.  Premenstrual Dysphoric Disorder (PMDD) Severe mood swings, depression, fatigue, and irritability linked to menstrual cycle. Symptoms exacerbated by hormonal changes. IUD in use; hormonal management challenging.  Plantar fasciitis Exacerbated by flat feet. Managed with stretching exercises. - Provide stretching exercises.  Orders: No orders of the defined types were placed in this encounter.  No orders of the defined types were placed in this encounter.    Follow-Up Instructions: Return in about 2 months (around 03/17/2024).   Fuller Plan, MD  Note - This record has been created using [employer].  Chart creation errors have been sought, but may not always  have been located. Such creation errors do not reflect on  the standard of medical care.

## 2024-01-31 ENCOUNTER — Other Ambulatory Visit: Payer: Self-pay | Admitting: Family Medicine

## 2024-02-03 ENCOUNTER — Encounter: Payer: Self-pay | Admitting: Family Medicine

## 2024-02-03 ENCOUNTER — Ambulatory Visit (INDEPENDENT_AMBULATORY_CARE_PROVIDER_SITE_OTHER): Admitting: Family Medicine

## 2024-02-03 VITALS — BP 144/86 | HR 90 | Temp 98.9°F | Ht 65.5 in | Wt 296.0 lb

## 2024-02-03 DIAGNOSIS — F32A Depression, unspecified: Secondary | ICD-10-CM | POA: Diagnosis not present

## 2024-02-03 MED ORDER — BUSPIRONE HCL 15 MG PO TABS: 15.0000 mg | ORAL_TABLET | Freq: Two times a day (BID) | ORAL | 1 refills | Status: DC

## 2024-02-03 NOTE — Patient Instructions (Signed)
 Try taking 15mg  buspar  twice a day and let me know if that isn't helping.  Take care.  Glad to see you.

## 2024-02-03 NOTE — Progress Notes (Unsigned)
 Recently started buspar , up to 10mg  BID.  Started med about 2 weeks ago, on 10mg  BID for about 10 days.  She sees only a mild difference for the better.  Still with depression.  No ADE on med.    H/o fatigue and aches, longstanding.   Celebrex helps with flares of joint pain.    Still in counseling. She has mood changes with her menses.    D/w pt about seeing Dr. Rodell Citrin with rheumatology.  D/w pt about recent labs.  TPO positive as expected, d/w pt.    Genetic screening d/w pt.  No FH early cardiac death.  D/w pt about possibly getting echo done, at some point in the future.   Meds, vitals, and allergies reviewed.   ROS: Per HPI unless specifically indicated in ROS section   GEN: nad, alert and oriented, tearful, regains composure.   HEENT: ncat NECK: supple w/o LA CV: rrr. PULM: ctab, no inc wob ABD: soft, +bs EXT: no edema SKIN: no acute rash  35 minutes were devoted to patient care in this encounter (this includes time spent reviewing the patient's file/history, interviewing and examining the patient, counseling/reviewing plan with patient).

## 2024-02-04 NOTE — Assessment & Plan Note (Signed)
 Discussed options.  She can try taking 15mg  buspar  twice a day and let me know if that isn't helping. Okay for outpatient f/u.

## 2024-02-09 ENCOUNTER — Encounter: Payer: Self-pay | Admitting: Family Medicine

## 2024-02-12 ENCOUNTER — Other Ambulatory Visit: Payer: Self-pay | Admitting: Family Medicine

## 2024-02-12 MED ORDER — DULOXETINE HCL 60 MG PO CPEP: 60.0000 mg | ORAL_CAPSULE | Freq: Every day | ORAL | 1 refills | Status: DC

## 2024-02-12 MED ORDER — DULOXETINE HCL 30 MG PO CPEP: 30.0000 mg | ORAL_CAPSULE | Freq: Every day | ORAL | 1 refills | Status: DC

## 2024-03-02 ENCOUNTER — Other Ambulatory Visit: Payer: Self-pay | Admitting: Family Medicine

## 2024-03-04 NOTE — Progress Notes (Signed)
 Office Visit Note  Patient: Hannah Acosta             Date of Birth: 1985/11/18           MRN: [id number]             PCP: Cleatus Arlyss RAMAN, MD Referring: Cleatus Arlyss RAMAN, MD Visit Date: 03/17/2024   Subjective:  Follow-up (Patient sttaes she has been in a flare for a few days. Patient states she has pain in her wrists, hands, ankles, feet, back, and hips. Patient states she has had bad brain fog and fatigue. Patient states she has had some itching at some joints too. )   Discussed the use of AI scribe software for clinical note transcription with the patient, who gave verbal consent to proceed.  History of Present Illness   Hannah Acosta is a 38 year old female with Hashimoto's thyroiditis who presents with a flare-up of premenstrual symptoms and joint pain. She was referred by her husband for evaluation of her symptoms and medication management.  She is experiencing a flare-up of symptoms associated with her premenstrual phase, which began last week and intensified around Monday. Her symptoms sometimes coincide with her menstrual cycle.  She is currently taking Cymbalta  (duloxetine ) at 90 mg, which she started on her own initiative. Cymbalta  sometimes only alleviates her symptoms partially. She is not taking NSAIDs like Celebrex or roxafenam due to potential interactions with her current medication. She has a history of using sertraline  (Zoloft ) for PMDD, which was effective for mood but led to joint pain, prompting a switch to duloxetine . She is concerned about potential joint pain with similar medications.  Her lab tests show a highly positive thyroperoxidase antibody, consistent with Hashimoto's thyroiditis.     Previous HPI 01/16/24 Hannah Acosta is a 38 year old female with Hashimoto's thyroiditis who presents with joint and muscle problems. She was referred by Dr. Earlyne for evaluation of abnormal lab tests and joint and muscle problems.   She has been  experiencing joint and muscle problems since 2019 or 2020, typically following an illness. Symptoms appear one to two weeks post-illness and last about one to two weeks, occurring approximately five to six times a year. These episodes are characterized by extreme fatigue, pain, and inflammation, which she describes as an 'aftershock' following an 'earthquake'.   Pain is primarily in the finger joints, knuckles, wrists, feet, and hips, severe enough to interfere with sleep. She uses anti-inflammatories such as ibuprofen  and Tylenol , though they are not always effective. There is no visible discoloration or swelling, and the pain is accompanied by stiffness but not numbness.   She has a history of Hashimoto's thyroiditis, diagnosed after her second pregnancy, and has been on Synthroid  since 2020. Despite taking vitamin D3 supplements for low vitamin D  levels, she continues to feel fatigued and describes herself as being in a 'complete brain fog'.   She experiences symptoms of premenstrual dysphoric disorder (PMDD), including extreme mood swings, depression, fatigue, and brain fog, which she is currently experiencing.   Her family history includes a grandmother with low thyroid  function, though not autoimmune, and a grandfather with Parkinson's disease. There is no known family history of autoimmune diseases such as rheumatoid arthritis or lupus.   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 She describes her lifestyle as active, constantly on the go with her young daughters.  Labs reviewed 10/2023 ANA neg RF neg ESR 22 Vit D 22.48   Review of Systems  Constitutional:  Positive for fatigue.  HENT:  Negative for mouth sores and mouth dryness.   Eyes:  Negative for dryness.  Respiratory:  Negative for shortness of breath.   Cardiovascular:  Negative for chest pain and palpitations.  Gastrointestinal:  Negative for blood in stool,  constipation and diarrhea.  Endocrine: Negative for increased urination.  Genitourinary:  Positive for involuntary urination.  Musculoskeletal:  Positive for joint pain, joint pain, joint swelling, myalgias, muscle weakness, morning stiffness, muscle tenderness and myalgias. Negative for gait problem.  Skin:  Positive for sensitivity to sunlight. Negative for color change, rash and hair loss.  Allergic/Immunologic: Positive for susceptible to infections.  Neurological:  Negative for dizziness and headaches.  Hematological:  Negative for swollen glands.  Psychiatric/Behavioral:  Positive for depressed mood and sleep disturbance. The patient is nervous/anxious.     PMFS History:  Patient Active Problem List   Diagnosis Date Noted   Fibromyalgia 03/17/2024   Positive ANA (antinuclear antibody) 01/16/2024   Plantar fasciitis 01/16/2024   Lateral pain of hip 01/16/2024   Rash 09/10/2023   Advance care planning 09/10/2023   Joint pain 09/10/2023   Hypothyroidism 08/31/2019   MDD (major depressive disorder), severe (HCC) 08/31/2019   Depression 03/05/2019   Binge eating disorder 12/12/2015   Anxiety 05/11/2015    Past Medical History:  Diagnosis Date   Anxiety    Depression    Hashimoto's disease    endocrinologist--- dr faythe   History of anemia    during pregnancy   History of COVID-19 10/2019   per pt mild symptoms that resolved   History of pregnancy induced hypertension    History of respiratory syncytial virus (RSV) infection 09/2020   Menorrhagia    PMDD (premenstrual dysphoric disorder)    SUI (stress urinary incontinence, female)     Family History  Problem Relation Age of Onset   Alcohol abuse Mother    Anxiety disorder Mother    Hypertension Mother    Alcohol abuse Father    Cancer Maternal Grandmother    Anxiety disorder Maternal Grandmother    Hypertension Maternal Grandmother    Anxiety disorder Sister    Early death Maternal Uncle    Past Surgical  History:  Procedure Laterality Date   CESAREAN SECTION N/A 08/18/2016   Procedure: CESAREAN SECTION;  Surgeon: Marie-Lyne Lavoie, MD;  Location: [facility];  Service: Obstetrics;  Laterality: N/A;   CESAREAN SECTION N/A 03/07/2019   Procedure: CESAREAN SECTION;  Surgeon: Henry Slough, MD;  Location: [facility];  Service: Obstetrics;  Laterality: N/A;   DILITATION & CURRETTAGE/HYSTROSCOPY WITH NOVASURE ABLATION N/A 06/22/2021   Procedure: DILATATION & CURETTAGE/HYSTEROSCOPY WITH ATTEMPTED NOVASURE ABLATION;  Surgeon: Henry Slough, MD;  Location: [facility] [place];  Service: Gynecology;  Laterality: N/A;   WISDOM TOOTH EXTRACTION     Social History   Social History Narrative   Married 2014   2 kids.     From [place]   Immunization History  Administered Date(s) Administered   Influenza, Seasonal, Injecte, Preservative Fre 07/02/2016   Pfizer(Comirnaty)Fall Seasonal Vaccine 12 years and older 01/21/2020, 02/15/2020   Tdap 12/31/2018     Objective: Vital Signs: BP (!) 153/86 (BP Location: Left Arm, Patient Position: Sitting)   Pulse 88   Resp 14   Ht 5' 5 (1.651 m)   Wt (!) 302 lb (137 kg)   BMI  50.26 kg/m    Physical Exam Constitutional:      Appearance: She is obese.   Eyes:     Conjunctiva/sclera: Conjunctivae normal.    Cardiovascular:     Rate and Rhythm: Normal rate and regular rhythm.  Pulmonary:     Effort: Pulmonary effort is normal.     Breath sounds: Normal breath sounds.   Musculoskeletal:     Right lower leg: No edema.     Left lower leg: No edema.  Lymphadenopathy:     Cervical: No cervical adenopathy.   Skin:    General: Skin is warm and dry.     Comments: Redness on face and upper extremities, blanching, no purpura or papules No nail changes   Neurological:     Mental Status: She is alert.   Psychiatric:        Mood and Affect: Mood normal.      Musculoskeletal Exam:  Shoulders full ROM no tenderness or  swelling Elbows full ROM no tenderness or swelling Wrists full ROM no tenderness or swelling Fingers full ROM no tenderness or swelling Lateral hip pain provoked with internal rotation, mild tenderness to pressure Knees full ROM no tenderness or swelling Ankles full ROM no tenderness or swelling  Investigation: No additional findings.  Imaging: No results found.  Recent Labs: Lab Results  Component Value Date   WBC 7.2 06/20/2021   HGB 14.6 08/27/2023   PLT 298 06/20/2021   NA 140 06/20/2021   K 4.2 06/20/2021   CL 107 06/20/2021   CO2 25 06/20/2021   GLUCOSE 104 (H) 06/20/2021   BUN 14 06/20/2021   CREATININE 0.7 08/27/2023   BILITOT 0.7 08/30/2019   ALKPHOS 76 08/30/2019   AST 17 08/27/2023   ALT 23 08/27/2023   PROT 8.4 (H) 08/30/2019   ALBUMIN 5.2 (H) 08/30/2019   CALCIUM 9.2 06/20/2021   GFRAA >60 08/30/2019    Speciality Comments: No specialty comments available.  Procedures:  No procedures performed Allergies: Sertraline  and Wellbutrin [bupropion]   Assessment / Plan:     Visit Diagnoses: Fibromyalgia Premenstrual dysphoric disorder (PMDD) Symptoms overlap with PMDD. Duloxetine  provides partial relief. Venlafaxine considered for its effectiveness in fibromyalgia and PMDD overlap. Symptoms occur premenstrually, associated with fibromyalgia. Duloxetine  provides partial relief. Venlafaxine considered for its effectiveness in PMDD and fibromyalgia overlap on review of literature and alternative medication options for her to consider. Discussed risks of serotonin toxicity and withdrawal, so would proceed along gradual tapering of duloxetine  dose. - Discussed non-pharmacological strategies such as exercise, diet, and stress management. - Will also contact PCP for plan, would recommend tapering cymbalta  at 30 mg increments with addition venlafaxine 75 mg increments - Monitor for side effects such as diarrhea, irritable bowels, twitchiness, or clonus.  Hashimoto's  thyroiditis Confirmed by highly positive thyroperoxidase antibodies (338). Thyroid  function may fluctuate, leading to eventual thyroid  burnout if not supplemented. No evidence of other autoimmune diseases from exam history and labs overall.       Orders: No orders of the defined types were placed in this encounter.  No orders of the defined types were placed in this encounter.    Follow-Up Instructions: Return in about 3 months (around 06/17/2024) for ?FMS/PMDD ?effexor switch f/u 3mos.   Lonni LELON Ester, MD  Note - This record has been created using [employer].  Chart creation errors have been sought, but may not always  have been located. Such creation errors do not reflect on  the standard of medical  care.

## 2024-03-17 ENCOUNTER — Ambulatory Visit: Attending: Internal Medicine | Admitting: Internal Medicine

## 2024-03-17 ENCOUNTER — Encounter: Payer: Self-pay | Admitting: Internal Medicine

## 2024-03-17 VITALS — BP 153/86 | HR 88 | Resp 14 | Ht 65.0 in | Wt 302.0 lb

## 2024-03-17 DIAGNOSIS — M797 Fibromyalgia: Secondary | ICD-10-CM

## 2024-03-17 NOTE — Patient Instructions (Signed)
 I recommend checking out the [facility] [facility] [place] patient-centered guide for fibromyalgia and chronic pain management: [email or web address]

## 2024-03-19 ENCOUNTER — Encounter: Payer: Self-pay | Admitting: Family Medicine

## 2024-03-22 DIAGNOSIS — F329 Major depressive disorder, single episode, unspecified: Secondary | ICD-10-CM | POA: Diagnosis not present

## 2024-04-01 MED ORDER — VENLAFAXINE HCL ER 75 MG PO CP24: 75.0000 mg | ORAL_CAPSULE | Freq: Every day | ORAL | 2 refills | Status: DC

## 2024-04-04 ENCOUNTER — Other Ambulatory Visit: Payer: Self-pay | Admitting: Family Medicine

## 2024-04-04 DIAGNOSIS — M797 Fibromyalgia: Secondary | ICD-10-CM

## 2024-04-04 MED ORDER — VENLAFAXINE HCL ER 75 MG PO CP24: 75.0000 mg | ORAL_CAPSULE | Freq: Every day | ORAL | 2 refills | Status: DC

## 2024-04-04 MED ORDER — DULOXETINE HCL 30 MG PO CPEP: 30.0000 mg | ORAL_CAPSULE | Freq: Every day | ORAL | 0 refills | Status: DC

## 2024-04-06 DIAGNOSIS — F329 Major depressive disorder, single episode, unspecified: Secondary | ICD-10-CM | POA: Diagnosis not present

## 2024-04-15 DIAGNOSIS — F329 Major depressive disorder, single episode, unspecified: Secondary | ICD-10-CM | POA: Diagnosis not present

## 2024-04-30 DIAGNOSIS — F329 Major depressive disorder, single episode, unspecified: Secondary | ICD-10-CM | POA: Diagnosis not present

## 2024-05-06 ENCOUNTER — Other Ambulatory Visit: Payer: Self-pay | Admitting: Family Medicine

## 2024-05-06 DIAGNOSIS — F329 Major depressive disorder, single episode, unspecified: Secondary | ICD-10-CM | POA: Diagnosis not present

## 2024-05-06 DIAGNOSIS — M797 Fibromyalgia: Secondary | ICD-10-CM

## 2024-05-07 NOTE — Telephone Encounter (Signed)
 Message from pharmacy:  REQUEST FOR 90 DAYS PRESCRIPTION. DX Code Needed.   Last filled:  04/13/24, #60

## 2024-05-10 ENCOUNTER — Other Ambulatory Visit: Payer: Self-pay | Admitting: Family Medicine

## 2024-05-11 DIAGNOSIS — F329 Major depressive disorder, single episode, unspecified: Secondary | ICD-10-CM | POA: Diagnosis not present

## 2024-05-21 DIAGNOSIS — F329 Major depressive disorder, single episode, unspecified: Secondary | ICD-10-CM | POA: Diagnosis not present

## 2024-05-27 DIAGNOSIS — F329 Major depressive disorder, single episode, unspecified: Secondary | ICD-10-CM | POA: Diagnosis not present

## 2024-05-31 DIAGNOSIS — F329 Major depressive disorder, single episode, unspecified: Secondary | ICD-10-CM | POA: Diagnosis not present

## 2024-06-03 DIAGNOSIS — F329 Major depressive disorder, single episode, unspecified: Secondary | ICD-10-CM | POA: Diagnosis not present

## 2024-06-07 ENCOUNTER — Encounter: Payer: Self-pay | Admitting: Family Medicine

## 2024-06-08 DIAGNOSIS — F329 Major depressive disorder, single episode, unspecified: Secondary | ICD-10-CM | POA: Diagnosis not present

## 2024-06-08 NOTE — Progress Notes (Deleted)
 Office Visit Note  Patient: Hannah Acosta             Date of Birth: 1986/03/14           MRN: [id number]             PCP: Cleatus Arlyss RAMAN, MD Referring: Cleatus Arlyss RAMAN, MD Visit Date: 06/21/2024   Subjective:  No chief complaint on file.   History of Present Illness: Hannah Acosta is a 38 y.o. female here for follow up with Hashimoto's thyroiditis who presents with a flare-up of premenstrual symptoms and joint pain.    Previous HPI 03/17/2024 Hannah Acosta is a 38 year old female with Hashimoto's thyroiditis who presents with a flare-up of premenstrual symptoms and joint pain. She was referred by her husband for evaluation of her symptoms and medication management.   She is experiencing a flare-up of symptoms associated with her premenstrual phase, which began last week and intensified around Monday. Her symptoms sometimes coincide with her menstrual cycle.   She is currently taking Cymbalta  (duloxetine ) at 90 mg, which she started on her own initiative. Cymbalta  sometimes only alleviates her symptoms partially. She is not taking NSAIDs like Celebrex  or roxafenam due to potential interactions with her current medication. She has a history of using sertraline  (Zoloft ) for PMDD, which was effective for mood but led to joint pain, prompting a switch to duloxetine . She is concerned about potential joint pain with similar medications.   Her lab tests show a highly positive thyroperoxidase antibody, consistent with Hashimoto's thyroiditis.      Previous HPI 01/16/24 Hannah Acosta is a 38 year old female with Hashimoto's thyroiditis who presents with joint and muscle problems. She was referred by Dr. Earlyne for evaluation of abnormal lab tests and joint and muscle problems.   She has been experiencing joint and muscle problems since 2019 or 2020, typically following an illness. Symptoms appear one to two weeks post-illness and last about one to two weeks, occurring  approximately five to six times a year. These episodes are characterized by extreme fatigue, pain, and inflammation, which she describes as an 'aftershock' following an 'earthquake'.   Pain is primarily in the finger joints, knuckles, wrists, feet, and hips, severe enough to interfere with sleep. She uses anti-inflammatories such as ibuprofen  and Tylenol , though they are not always effective. There is no visible discoloration or swelling, and the pain is accompanied by stiffness but not numbness.   She has a history of Hashimoto's thyroiditis, diagnosed after her second pregnancy, and has been on Synthroid  since 2020. Despite taking vitamin D3 supplements for low vitamin D  levels, she continues to feel fatigued and describes herself as being in a 'complete brain fog'.   She experiences symptoms of premenstrual dysphoric disorder (PMDD), including extreme mood swings, depression, fatigue, and brain fog, which she is currently experiencing.   Her family history includes a grandmother with low thyroid  function, though not autoimmune, and a grandfather with Parkinson's disease. There is no known family history of autoimmune diseases such as rheumatoid arthritis or lupus.   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 She describes her lifestyle as active, constantly on the go with her young daughters.       Labs reviewed 10/2023 ANA neg RF neg ESR 22 Vit D 22.48   No Rheumatology ROS completed.   PMFS History:  Patient Active Problem List   Diagnosis Date  Noted   Fibromyalgia 03/17/2024   Positive ANA (antinuclear antibody) 01/16/2024   Plantar fasciitis 01/16/2024   Lateral pain of hip 01/16/2024   Rash 09/10/2023   Advance care planning 09/10/2023   Joint pain 09/10/2023   Hypothyroidism 08/31/2019   MDD (major depressive disorder), severe (HCC) 08/31/2019   Depression 03/05/2019   Binge eating disorder 12/12/2015   Anxiety  05/11/2015    Past Medical History:  Diagnosis Date   Anxiety    Depression    Hashimoto's disease    endocrinologist--- dr faythe   History of anemia    during pregnancy   History of COVID-19 10/2019   per pt mild symptoms that resolved   History of pregnancy induced hypertension    History of respiratory syncytial virus (RSV) infection 09/2020   Menorrhagia    PMDD (premenstrual dysphoric disorder)    SUI (stress urinary incontinence, female)     Family History  Problem Relation Age of Onset   Alcohol abuse Mother    Anxiety disorder Mother    Hypertension Mother    Alcohol abuse Father    Cancer Maternal Grandmother    Anxiety disorder Maternal Grandmother    Hypertension Maternal Grandmother    Anxiety disorder Sister    Early death Maternal Uncle    Past Surgical History:  Procedure Laterality Date   CESAREAN SECTION N/A 08/18/2016   Procedure: CESAREAN SECTION;  Surgeon: Marie-Lyne Lavoie, MD;  [facility]: [facility];  Service: Obstetrics;  Laterality: N/A;   CESAREAN SECTION N/A 03/07/2019   Procedure: CESAREAN SECTION;  Surgeon: Henry Slough, MD;  Location: [facility];  Service: Obstetrics;  Laterality: N/A;   DILITATION & CURRETTAGE/HYSTROSCOPY WITH NOVASURE ABLATION N/A 06/22/2021   Procedure: DILATATION & CURETTAGE/HYSTEROSCOPY WITH ATTEMPTED NOVASURE ABLATION;  Surgeon: Henry Slough, MD;  Location: [facility];  Service: Gynecology;  Laterality: N/A;   WISDOM TOOTH EXTRACTION     Social History   Social History Narrative   Married 2014   2 kids.     From [place]   Immunization History  Administered Date(s) Administered   Influenza, Seasonal, Injecte, Preservative Fre 07/02/2016   Pfizer(Comirnaty)Fall Seasonal Vaccine 12 years and older 01/21/2020, 02/15/2020   Tdap 12/31/2018     Objective: Vital Signs: There were no vitals taken for this visit.   Physical Exam   Musculoskeletal Exam: ***  CDAI Exam: CDAI Score:  -- Patient Global: --; Provider Global: -- Swollen: --; Tender: -- Joint Exam 06/21/2024   No joint exam has been documented for this visit   There is currently no information documented on the homunculus. Go to the Rheumatology activity and complete the homunculus joint exam.  Investigation: No additional findings.  Imaging: No results found.  Recent Labs: Lab Results  Component Value Date   WBC 7.2 06/20/2021   HGB 14.6 08/27/2023   PLT 298 06/20/2021   NA 140 06/20/2021   K 4.2 06/20/2021   CL 107 06/20/2021   CO2 25 06/20/2021   GLUCOSE 104 (H) 06/20/2021   BUN 14 06/20/2021   CREATININE 0.7 08/27/2023   BILITOT 0.7 08/30/2019   ALKPHOS 76 08/30/2019   AST 17 08/27/2023   ALT 23 08/27/2023   PROT 8.4 (H) 08/30/2019   ALBUMIN 5.2 (H) 08/30/2019   CALCIUM 9.2 06/20/2021   GFRAA >60 08/30/2019    Speciality Comments: No specialty comments available.  Procedures:  No procedures performed Allergies: Sertraline  and Wellbutrin [bupropion]   Assessment / Plan:  Visit Diagnoses: No diagnosis found.  ***  Orders: No orders of the defined types were placed in this encounter.  No orders of the defined types were placed in this encounter.    Follow-Up Instructions: No follow-ups on file.   Levaeh Vice M Stephenson Cichy, CMA  Note - This record has been created using Animal nutritionist.  Chart creation errors have been sought, but may not always  have been located. Such creation errors do not reflect on  the standard of medical care.

## 2024-06-09 ENCOUNTER — Other Ambulatory Visit: Payer: Self-pay | Admitting: Family Medicine

## 2024-06-09 DIAGNOSIS — E039 Hypothyroidism, unspecified: Secondary | ICD-10-CM

## 2024-06-09 MED ORDER — LEVOTHYROXINE SODIUM 175 MCG PO TABS: 175.0000 ug | ORAL_TABLET | ORAL | 1 refills | Status: AC

## 2024-06-09 MED ORDER — LEVOTHYROXINE SODIUM 150 MCG PO TABS: 150.0000 ug | ORAL_TABLET | ORAL | 1 refills | Status: AC

## 2024-06-09 MED ORDER — CELECOXIB 200 MG PO CAPS: 200.0000 mg | ORAL_CAPSULE | Freq: Every day | ORAL | Status: DC

## 2024-06-10 DIAGNOSIS — F329 Major depressive disorder, single episode, unspecified: Secondary | ICD-10-CM | POA: Diagnosis not present

## 2024-06-12 ENCOUNTER — Other Ambulatory Visit: Payer: Self-pay | Admitting: Family Medicine

## 2024-06-17 ENCOUNTER — Other Ambulatory Visit (INDEPENDENT_AMBULATORY_CARE_PROVIDER_SITE_OTHER)

## 2024-06-17 DIAGNOSIS — E039 Hypothyroidism, unspecified: Secondary | ICD-10-CM | POA: Diagnosis not present

## 2024-06-17 DIAGNOSIS — E559 Vitamin D deficiency, unspecified: Secondary | ICD-10-CM | POA: Diagnosis not present

## 2024-06-17 LAB — VITAMIN D 25 HYDROXY (VIT D DEFICIENCY, FRACTURES): VITD: 30.98 ng/mL (ref 30.00–100.00)

## 2024-06-17 LAB — TSH: TSH: 2.77 u[IU]/mL (ref 0.35–5.50)

## 2024-06-18 DIAGNOSIS — F329 Major depressive disorder, single episode, unspecified: Secondary | ICD-10-CM | POA: Diagnosis not present

## 2024-06-20 ENCOUNTER — Ambulatory Visit: Payer: Self-pay | Admitting: Family Medicine

## 2024-06-21 ENCOUNTER — Ambulatory Visit: Admitting: Internal Medicine

## 2024-06-21 DIAGNOSIS — M797 Fibromyalgia: Secondary | ICD-10-CM

## 2024-06-22 ENCOUNTER — Other Ambulatory Visit

## 2024-06-23 DIAGNOSIS — F329 Major depressive disorder, single episode, unspecified: Secondary | ICD-10-CM | POA: Diagnosis not present

## 2024-06-30 DIAGNOSIS — F329 Major depressive disorder, single episode, unspecified: Secondary | ICD-10-CM | POA: Diagnosis not present

## 2024-07-05 DIAGNOSIS — F329 Major depressive disorder, single episode, unspecified: Secondary | ICD-10-CM | POA: Diagnosis not present

## 2024-07-13 NOTE — Progress Notes (Deleted)
 Office Visit Note  Patient: Hannah Acosta             Date of Birth: Dec 17, 1985           MRN: [id number]             PCP: Cleatus Arlyss RAMAN, MD Referring: Cleatus Arlyss RAMAN, MD Visit Date: 07/27/2024   Subjective:  No chief complaint on file.   History of Present Illness: Hannah Acosta is a 38 y.o. female here for follow up with Hashimoto's thyroiditis who presents with a flare-up of premenstrual symptoms and joint pain.    Previous HPI 03/17/2024 Hannah Acosta is a 38 year old female with Hashimoto's thyroiditis who presents with a flare-up of premenstrual symptoms and joint pain. She was referred by her husband for evaluation of her symptoms and medication management.   She is experiencing a flare-up of symptoms associated with her premenstrual phase, which began last week and intensified around Monday. Her symptoms sometimes coincide with her menstrual cycle.   She is currently taking Cymbalta  (duloxetine ) at 90 mg, which she started on her own initiative. Cymbalta  sometimes only alleviates her symptoms partially. She is not taking NSAIDs like Celebrex  or roxafenam due to potential interactions with her current medication. She has a history of using sertraline  (Zoloft ) for PMDD, which was effective for mood but led to joint pain, prompting a switch to duloxetine . She is concerned about potential joint pain with similar medications.   Her lab tests show a highly positive thyroperoxidase antibody, consistent with Hashimoto's thyroiditis.      Previous HPI 01/16/24 Hannah Acosta is a 38 year old female with Hashimoto's thyroiditis who presents with joint and muscle problems. She was referred by Dr. Earlyne for evaluation of abnormal lab tests and joint and muscle problems.   She has been experiencing joint and muscle problems since 2019 or 2020, typically following an illness. Symptoms appear one to two weeks post-illness and last about one to two weeks, occurring  approximately five to six times a year. These episodes are characterized by extreme fatigue, pain, and inflammation, which she describes as an 'aftershock' following an 'earthquake'.   Pain is primarily in the finger joints, knuckles, wrists, feet, and hips, severe enough to interfere with sleep. She uses anti-inflammatories such as ibuprofen  and Tylenol , though they are not always effective. There is no visible discoloration or swelling, and the pain is accompanied by stiffness but not numbness.   She has a history of Hashimoto's thyroiditis, diagnosed after her second pregnancy, and has been on Synthroid  since 2020. Despite taking vitamin D3 supplements for low vitamin D  levels, she continues to feel fatigued and describes herself as being in a 'complete brain fog'.   She experiences symptoms of premenstrual dysphoric disorder (PMDD), including extreme mood swings, depression, fatigue, and brain fog, which she is currently experiencing.   Her family history includes a grandmother with low thyroid  function, though not autoimmune, and a grandfather with Parkinson's disease. There is no known family history of autoimmune diseases such as rheumatoid arthritis or lupus.   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 She describes her lifestyle as active, constantly on the go with her young daughters.       Labs reviewed 10/2023 ANA neg RF neg ESR 22 Vit D 22.48   No Rheumatology ROS completed.   PMFS History:  Patient Active Problem List   Diagnosis Date  Noted   Fibromyalgia 03/17/2024   Positive ANA (antinuclear antibody) 01/16/2024   Plantar fasciitis 01/16/2024   Lateral pain of hip 01/16/2024   Rash 09/10/2023   Advance care planning 09/10/2023   Joint pain 09/10/2023   Hypothyroidism 08/31/2019   MDD (major depressive disorder), severe (HCC) 08/31/2019   Depression 03/05/2019   Binge eating disorder 12/12/2015   Anxiety  05/11/2015    Past Medical History:  Diagnosis Date   Anxiety    Depression    Hashimoto's disease    endocrinologist--- dr faythe   History of anemia    during pregnancy   History of COVID-19 10/2019   per pt mild symptoms that resolved   History of pregnancy induced hypertension    History of respiratory syncytial virus (RSV) infection 09/2020   Menorrhagia    PMDD (premenstrual dysphoric disorder)    SUI (stress urinary incontinence, female)     Family History  Problem Relation Age of Onset   Alcohol abuse Mother    Anxiety disorder Mother    Hypertension Mother    Alcohol abuse Father    Cancer Maternal Grandmother    Anxiety disorder Maternal Grandmother    Hypertension Maternal Grandmother    Anxiety disorder Sister    Early death Maternal Uncle    Past Surgical History:  Procedure Laterality Date   CESAREAN SECTION N/A 08/18/2016   Procedure: CESAREAN SECTION;  Surgeon: Marie-Lyne Lavoie, MD;  [facility];  Service: Obstetrics;  Laterality: N/A;   CESAREAN SECTION N/A 03/07/2019   Procedure: CESAREAN SECTION;  Surgeon: Henry Slough, MD;  Location: [facility];  Service: Obstetrics;  Laterality: N/A;   DILITATION & CURRETTAGE/HYSTROSCOPY WITH NOVASURE ABLATION N/A 06/22/2021   Procedure: DILATATION & CURETTAGE/HYSTEROSCOPY WITH ATTEMPTED NOVASURE ABLATION;  Surgeon: Henry Slough, MD;  Location: [facility];  Service: Gynecology;  Laterality: N/A;   WISDOM TOOTH EXTRACTION     Social History   Social History Narrative   Married 2014   2 kids.     From [place]   Immunization History  Administered Date(s) Administered   Influenza, Seasonal, Injecte, Preservative Fre 07/02/2016   Pfizer(Comirnaty)Fall Seasonal Vaccine 12 years and older 01/21/2020, 02/15/2020   Tdap 12/31/2018     Objective: Vital Signs: There were no vitals taken for this visit.   Physical Exam   Musculoskeletal Exam: ***  CDAI Exam: CDAI Score:  -- Patient Global: --; Provider Global: -- Swollen: --; Tender: -- Joint Exam 07/27/2024   No joint exam has been documented for this visit   There is currently no information documented on the homunculus. Go to the Rheumatology activity and complete the homunculus joint exam.  Investigation: No additional findings.  Imaging: No results found.  Recent Labs: Lab Results  Component Value Date   WBC 7.2 06/20/2021   HGB 14.6 08/27/2023   PLT 298 06/20/2021   NA 140 06/20/2021   K 4.2 06/20/2021   CL 107 06/20/2021   CO2 25 06/20/2021   GLUCOSE 104 (H) 06/20/2021   BUN 14 06/20/2021   CREATININE 0.7 08/27/2023   BILITOT 0.7 08/30/2019   ALKPHOS 76 08/30/2019   AST 17 08/27/2023   ALT 23 08/27/2023   PROT 8.4 (H) 08/30/2019   ALBUMIN 5.2 (H) 08/30/2019   CALCIUM 9.2 06/20/2021   GFRAA >60 08/30/2019    Speciality Comments: No specialty comments available.  Procedures:  No procedures performed Allergies: Sertraline  and Wellbutrin [bupropion]   Assessment / Plan:  Visit Diagnoses: No diagnosis found.  ***  Orders: No orders of the defined types were placed in this encounter.  No orders of the defined types were placed in this encounter.    Follow-Up Instructions: No follow-ups on file.   Jjesus Dingley M Ramandeep Arington, CMA  Note - This record has been created using Animal nutritionist.  Chart creation errors have been sought, but may not always  have been located. Such creation errors do not reflect on  the standard of medical care.

## 2024-07-14 DIAGNOSIS — F329 Major depressive disorder, single episode, unspecified: Secondary | ICD-10-CM | POA: Diagnosis not present

## 2024-07-19 DIAGNOSIS — F329 Major depressive disorder, single episode, unspecified: Secondary | ICD-10-CM | POA: Diagnosis not present

## 2024-07-27 ENCOUNTER — Telehealth: Payer: Self-pay | Admitting: Internal Medicine

## 2024-07-27 ENCOUNTER — Ambulatory Visit: Admitting: Internal Medicine

## 2024-07-27 DIAGNOSIS — J22 Unspecified acute lower respiratory infection: Secondary | ICD-10-CM | POA: Diagnosis not present

## 2024-07-27 DIAGNOSIS — M797 Fibromyalgia: Secondary | ICD-10-CM

## 2024-07-27 NOTE — Telephone Encounter (Signed)
 Patient called stating she had covid and is still sick. Patient has an appointment tomorrow and would like to make that appointment a telehealth/ video visit. Pts appt is 07/27/24 at 10:20A

## 2024-07-27 NOTE — Telephone Encounter (Signed)
 Patient states she has a lower respiratory infection now after having COVID last week. Patient states she has cancelled her appointment and will wait to hear back on when to schedule and appointment. Patient states her venlafaxine  has helped a lot. Patient states she no longer has the Fibromyalgia pain after her menstrual cycle and her PMDD has improved and is much more manageable. Patient states she is very thankful for Dr. Jeannetta listening to her. Patient states if she does not answer then a message can be left. Please advise.

## 2024-07-28 DIAGNOSIS — F329 Major depressive disorder, single episode, unspecified: Secondary | ICD-10-CM | POA: Diagnosis not present

## 2024-07-28 NOTE — Telephone Encounter (Signed)
 I spoke with Hannah Acosta and reviewed notes with her PCP Dr. Cleatus. She reports noticing partial improvement in PMDD symptoms since changing to venlafaxine  and titration to 150 mg daily. She has not experienced a major flare up of fibromyalgia pain and fatigue symptoms. We did not see any concerning indicators for inflammatory disease at our previous evaluation so I do not think she needs scheduled follow up for monitoring or repeat testing. We could follow up PRN if she does develop new or increased symptoms.

## 2024-07-30 ENCOUNTER — Ambulatory Visit: Payer: Self-pay

## 2024-07-30 DIAGNOSIS — R0602 Shortness of breath: Secondary | ICD-10-CM | POA: Diagnosis not present

## 2024-07-30 DIAGNOSIS — R0600 Dyspnea, unspecified: Secondary | ICD-10-CM | POA: Diagnosis not present

## 2024-07-30 DIAGNOSIS — R5383 Other fatigue: Secondary | ICD-10-CM | POA: Diagnosis not present

## 2024-07-30 NOTE — Telephone Encounter (Signed)
 Patient called to report shortness of breath, headache and fatigue.  [facility]. Tested positive for COVID around 10/11. Reports generally not feeling well. Was started on Augmentin and Prednisone [facility] but still not feeling well. Feels like she can't take a deep breath in. Patient updated that there is no availability for PCP office or surrounding clinics. Patient is requesting to see if office can work her in today. Asking for a follow up call.  FYI Only or Action Required?: Action required by provider: request for appointment and clinical question for provider.  Patient was last seen in primary care on 02/03/2024 by Cleatus Arlyss RAMAN, MD.  Called Nurse Triage reporting Shortness of Breath.  Symptoms began several days ago.  Interventions attempted: Rest, hydration, or home remedies.  Symptoms are: unchanged.  Triage Disposition: See HCP Within 4 Hours (Or PCP Triage)  Patient/caregiver understands and will follow disposition?: No, wishes to speak with PCP  Copied from CRM [id number]. Topic: Clinical - Red Word Triage >> Jul 30, 2024  9:26 AM Lonell PEDLAR wrote: Red Word that prompted transfer to Nurse Triage: shortness of breath, headache, fatigue Reason for Disposition  [1] MILD difficulty breathing (e.g., minimal/no SOB at rest, SOB with walking, pulse < 100) AND [2] NEW-onset or WORSE than normal  Answer Assessment - Initial Assessment Questions 1. RESPIRATORY STATUS: Describe your breathing? (e.g., wheezing, shortness of breath, unable to speak, severe coughing)      Shortness of breath 2. ONSET: When did this breathing problem begin?      Started last week 3. PATTERN Does the difficult breathing come and go, or has it been constant since it started?      constant 4. SEVERITY: How bad is your breathing? (e.g., mild, moderate, severe)      Mild to moderate 5. RECURRENT SYMPTOM: Have you had difficulty breathing before? If Yes,  ask: When was the last time? and What happened that time?      no 6. CARDIAC HISTORY: Do you have any history of heart disease? (e.g., heart attack, angina, bypass surgery, angioplasty)      no 7. LUNG HISTORY: Do you have any history of lung disease?  (e.g., pulmonary embolus, asthma, emphysema)     no 8. CAUSE: What do you think is causing the breathing problem?      Post COVID 9. OTHER SYMPTOMS: Do you have any other symptoms? (e.g., chest pain, cough, dizziness, fever, runny nose)     Headache, fatigue 10. O2 SATURATION MONITOR:  Do you use an oxygen saturation monitor (pulse oximeter) at home? If Yes, ask: What is your reading (oxygen level) today? What is your usual oxygen saturation reading? (e.g., 95%)       NA 11. PREGNANCY: Is there any chance you are pregnant? When was your last menstrual period?       no 12. TRAVEL: Have you traveled out of the country in the last month? (e.g., travel history, exposures)       no  Protocols used: Breathing Difficulty-A-AH

## 2024-07-30 NOTE — Telephone Encounter (Signed)
 Unable to reach pt by phone. I spoke with pt husband to seer if different ontact # to speak with pt and he said no but pt was seen earlier today at [facility] [address] [place]. Visit in Care Everywhere. Pt had xray and test. See note from [facility] visit.   Patient Instructions Rosaline Jama Collet, NP - 07/30/2024 2:30 PM EDT  Formatting of this note might be different from the original. Your lab results will be back later on tonight, if the test for blood clots is positive you will be instructed to go to the er for a CT scan tonight. While we are waiting, continue the prednisone and augmentin, add albuterol every 4 hours to see if this helps with the shortness of breath. Follow up with your provider for reevaluation and continued treatment, if you have any new or worsening symptoms return for reevaluation Electronically signed by Rosaline Jama Collet, NP at 07/30/2024 3:21 PM EDT    Sending note to Dr Cleatus, Cleatus pool and will teams Llsa CMA./

## 2024-07-30 NOTE — Telephone Encounter (Signed)
Pls triage pt

## 2024-08-01 NOTE — Telephone Encounter (Signed)
 Noted.  I saw the clinic report.  Please get update on patient

## 2024-08-02 DIAGNOSIS — R0600 Dyspnea, unspecified: Secondary | ICD-10-CM | POA: Diagnosis not present

## 2024-08-02 NOTE — Telephone Encounter (Signed)
 Lvm asking pt to call back. Pls relay Dr Elfredia message.   I will also send his message to pt's MyChart.

## 2024-08-02 NOTE — Telephone Encounter (Addendum)
 Noted. Thanks.  I would give this a little more time with current rx.  I would expect gradual improvement.  If worse, then let us  know.  I hope she feels better soon.

## 2024-08-02 NOTE — Telephone Encounter (Signed)
 Spoke with pt relaying Dr Elfredia message and asking for update on sxs. States about 2 wks ago, dx with Covid and was still having cough and HA so she went to UC. Says she was told she had secondary respiratory infection and prescribed abx. Says she has 1 more dose and is still using albuterol inh- which is helpful. Still has deep cough at night but has improved during day. Also, c/o ongoing dull pain over entire head.

## 2024-08-04 DIAGNOSIS — F329 Major depressive disorder, single episode, unspecified: Secondary | ICD-10-CM | POA: Diagnosis not present

## 2024-08-07 ENCOUNTER — Other Ambulatory Visit: Payer: Self-pay | Admitting: Family Medicine

## 2024-08-07 MED ORDER — IBUPROFEN 200 MG PO TABS
400.0000 mg | ORAL_TABLET | Freq: Four times a day (QID) | ORAL | Status: AC | PRN
Start: 2024-08-07 — End: ?

## 2024-08-11 DIAGNOSIS — F329 Major depressive disorder, single episode, unspecified: Secondary | ICD-10-CM | POA: Diagnosis not present

## 2024-08-18 DIAGNOSIS — F329 Major depressive disorder, single episode, unspecified: Secondary | ICD-10-CM | POA: Diagnosis not present

## 2024-08-26 DIAGNOSIS — F329 Major depressive disorder, single episode, unspecified: Secondary | ICD-10-CM | POA: Diagnosis not present

## 2024-09-01 DIAGNOSIS — F329 Major depressive disorder, single episode, unspecified: Secondary | ICD-10-CM | POA: Diagnosis not present

## 2024-09-08 DIAGNOSIS — F329 Major depressive disorder, single episode, unspecified: Secondary | ICD-10-CM | POA: Diagnosis not present

## 2024-09-15 DIAGNOSIS — F329 Major depressive disorder, single episode, unspecified: Secondary | ICD-10-CM | POA: Diagnosis not present

## 2024-09-17 DIAGNOSIS — Z720 Tobacco use: Secondary | ICD-10-CM | POA: Diagnosis not present

## 2024-09-17 DIAGNOSIS — Z6841 Body Mass Index (BMI) 40.0 and over, adult: Secondary | ICD-10-CM | POA: Diagnosis not present

## 2024-09-17 DIAGNOSIS — J029 Acute pharyngitis, unspecified: Secondary | ICD-10-CM | POA: Diagnosis not present

## 2024-09-22 DIAGNOSIS — F329 Major depressive disorder, single episode, unspecified: Secondary | ICD-10-CM | POA: Diagnosis not present
